# Patient Record
Sex: Female | Born: 1985
Health system: Southern US, Community
[De-identification: ages and names within clinical notes are randomized; demographics above are authoritative.]

## PROBLEM LIST (undated history)

## (undated) NOTE — *Deleted (*Deleted)
Venous duplex completed and

---

## 2020-05-15 ENCOUNTER — Inpatient Hospital Stay (HOSPITAL_COMMUNITY): Payer: Medicaid Other

## 2020-05-15 ENCOUNTER — Emergency Department (HOSPITAL_COMMUNITY): Payer: Medicaid Other

## 2020-05-15 ENCOUNTER — Inpatient Hospital Stay (HOSPITAL_COMMUNITY)
Admission: EM | Admit: 2020-05-15 | Discharge: 2020-06-11 | DRG: 003 | Disposition: A | Discharge status: Rehabilitation Facility | Payer: Medicaid Other | Attending: Physician Assistant | Admitting: Physician Assistant

## 2020-05-15 ENCOUNTER — Other Ambulatory Visit: Payer: Self-pay

## 2020-05-15 DIAGNOSIS — D271 Benign neoplasm of left ovary: Secondary | ICD-10-CM | POA: Diagnosis present

## 2020-05-15 DIAGNOSIS — S62394B Other fracture of fourth metacarpal bone, right hand, initial encounter for open fracture: Secondary | ICD-10-CM | POA: Diagnosis present

## 2020-05-15 DIAGNOSIS — S52272A Monteggia's fracture of left ulna, initial encounter for closed fracture: Secondary | ICD-10-CM | POA: Diagnosis present

## 2020-05-15 DIAGNOSIS — Z930 Tracheostomy status: Secondary | ICD-10-CM

## 2020-05-15 DIAGNOSIS — R319 Hematuria, unspecified: Secondary | ICD-10-CM | POA: Diagnosis present

## 2020-05-15 DIAGNOSIS — K317 Polyp of stomach and duodenum: Secondary | ICD-10-CM | POA: Diagnosis present

## 2020-05-15 DIAGNOSIS — R402112 Coma scale, eyes open, never, at arrival to emergency department: Secondary | ICD-10-CM | POA: Diagnosis present

## 2020-05-15 DIAGNOSIS — S270XXA Traumatic pneumothorax, initial encounter: Secondary | ICD-10-CM | POA: Diagnosis present

## 2020-05-15 DIAGNOSIS — I959 Hypotension, unspecified: Secondary | ICD-10-CM | POA: Diagnosis present

## 2020-05-15 DIAGNOSIS — Y92410 Unspecified street and highway as the place of occurrence of the external cause: Secondary | ICD-10-CM | POA: Diagnosis not present

## 2020-05-15 DIAGNOSIS — J189 Pneumonia, unspecified organism: Secondary | ICD-10-CM | POA: Diagnosis present

## 2020-05-15 DIAGNOSIS — S2220XA Unspecified fracture of sternum, initial encounter for closed fracture: Secondary | ICD-10-CM

## 2020-05-15 DIAGNOSIS — J939 Pneumothorax, unspecified: Secondary | ICD-10-CM

## 2020-05-15 DIAGNOSIS — R509 Fever, unspecified: Secondary | ICD-10-CM | POA: Diagnosis not present

## 2020-05-15 DIAGNOSIS — Y906 Blood alcohol level of 120-199 mg/100 ml: Secondary | ICD-10-CM | POA: Diagnosis present

## 2020-05-15 DIAGNOSIS — S2222XA Fracture of body of sternum, initial encounter for closed fracture: Secondary | ICD-10-CM | POA: Diagnosis present

## 2020-05-15 DIAGNOSIS — B002 Herpesviral gingivostomatitis and pharyngotonsillitis: Secondary | ICD-10-CM | POA: Diagnosis not present

## 2020-05-15 DIAGNOSIS — Z9911 Dependence on respirator [ventilator] status: Secondary | ICD-10-CM | POA: Diagnosis not present

## 2020-05-15 DIAGNOSIS — J9601 Acute respiratory failure with hypoxia: Secondary | ICD-10-CM | POA: Diagnosis present

## 2020-05-15 DIAGNOSIS — J9690 Respiratory failure, unspecified, unspecified whether with hypoxia or hypercapnia: Secondary | ICD-10-CM | POA: Diagnosis not present

## 2020-05-15 DIAGNOSIS — G479 Sleep disorder, unspecified: Secondary | ICD-10-CM | POA: Diagnosis not present

## 2020-05-15 DIAGNOSIS — I82442 Acute embolism and thrombosis of left tibial vein: Secondary | ICD-10-CM | POA: Diagnosis not present

## 2020-05-15 DIAGNOSIS — S52209A Unspecified fracture of shaft of unspecified ulna, initial encounter for closed fracture: Secondary | ICD-10-CM

## 2020-05-15 DIAGNOSIS — S62616B Displaced fracture of proximal phalanx of right little finger, initial encounter for open fracture: Secondary | ICD-10-CM | POA: Diagnosis present

## 2020-05-15 DIAGNOSIS — S069X0D Unspecified intracranial injury without loss of consciousness, subsequent encounter: Secondary | ICD-10-CM | POA: Diagnosis not present

## 2020-05-15 DIAGNOSIS — S069X0S Unspecified intracranial injury without loss of consciousness, sequela: Secondary | ICD-10-CM | POA: Diagnosis not present

## 2020-05-15 DIAGNOSIS — S06359D Traumatic hemorrhage of left cerebrum with loss of consciousness of unspecified duration, subsequent encounter: Secondary | ICD-10-CM | POA: Diagnosis not present

## 2020-05-15 DIAGNOSIS — K121 Other forms of stomatitis: Secondary | ICD-10-CM | POA: Diagnosis not present

## 2020-05-15 DIAGNOSIS — S0181XA Laceration without foreign body of other part of head, initial encounter: Secondary | ICD-10-CM

## 2020-05-15 DIAGNOSIS — S062X9A Diffuse traumatic brain injury with loss of consciousness of unspecified duration, initial encounter: Principal | ICD-10-CM | POA: Diagnosis present

## 2020-05-15 DIAGNOSIS — I82452 Acute embolism and thrombosis of left peroneal vein: Secondary | ICD-10-CM | POA: Diagnosis not present

## 2020-05-15 DIAGNOSIS — T07XXXA Unspecified multiple injuries, initial encounter: Secondary | ICD-10-CM | POA: Diagnosis not present

## 2020-05-15 DIAGNOSIS — Z7289 Other problems related to lifestyle: Secondary | ICD-10-CM

## 2020-05-15 DIAGNOSIS — T148XXA Other injury of unspecified body region, initial encounter: Secondary | ICD-10-CM

## 2020-05-15 DIAGNOSIS — S52122A Displaced fracture of head of left radius, initial encounter for closed fracture: Secondary | ICD-10-CM | POA: Diagnosis present

## 2020-05-15 DIAGNOSIS — K668 Other specified disorders of peritoneum: Secondary | ICD-10-CM

## 2020-05-15 DIAGNOSIS — R1310 Dysphagia, unspecified: Secondary | ICD-10-CM | POA: Diagnosis present

## 2020-05-15 DIAGNOSIS — R402222 Coma scale, best verbal response, incomprehensible words, at arrival to emergency department: Secondary | ICD-10-CM | POA: Diagnosis present

## 2020-05-15 DIAGNOSIS — R402342 Coma scale, best motor response, flexion withdrawal, at arrival to emergency department: Secondary | ICD-10-CM | POA: Diagnosis present

## 2020-05-15 DIAGNOSIS — T1490XA Injury, unspecified, initial encounter: Secondary | ICD-10-CM

## 2020-05-15 DIAGNOSIS — S0240EB Zygomatic fracture, right side, initial encounter for open fracture: Secondary | ICD-10-CM | POA: Diagnosis present

## 2020-05-15 DIAGNOSIS — S069X9S Unspecified intracranial injury with loss of consciousness of unspecified duration, sequela: Secondary | ICD-10-CM | POA: Diagnosis not present

## 2020-05-15 DIAGNOSIS — S62619A Displaced fracture of proximal phalanx of unspecified finger, initial encounter for closed fracture: Secondary | ICD-10-CM

## 2020-05-15 DIAGNOSIS — D62 Acute posthemorrhagic anemia: Secondary | ICD-10-CM | POA: Diagnosis not present

## 2020-05-15 DIAGNOSIS — Z419 Encounter for procedure for purposes other than remedying health state, unspecified: Secondary | ICD-10-CM

## 2020-05-15 DIAGNOSIS — K9423 Gastrostomy malfunction: Secondary | ICD-10-CM | POA: Diagnosis not present

## 2020-05-15 DIAGNOSIS — Z20822 Contact with and (suspected) exposure to covid-19: Secondary | ICD-10-CM | POA: Diagnosis present

## 2020-05-15 DIAGNOSIS — Z931 Gastrostomy status: Secondary | ICD-10-CM | POA: Diagnosis not present

## 2020-05-15 DIAGNOSIS — S069X9A Unspecified intracranial injury with loss of consciousness of unspecified duration, initial encounter: Secondary | ICD-10-CM

## 2020-05-15 DIAGNOSIS — S52122S Displaced fracture of head of left radius, sequela: Secondary | ICD-10-CM | POA: Diagnosis not present

## 2020-05-15 DIAGNOSIS — R000 Tachycardia, unspecified: Secondary | ICD-10-CM | POA: Diagnosis not present

## 2020-05-15 DIAGNOSIS — Z23 Encounter for immunization: Secondary | ICD-10-CM | POA: Diagnosis not present

## 2020-05-15 DIAGNOSIS — S36113A Laceration of liver, unspecified degree, initial encounter: Secondary | ICD-10-CM

## 2020-05-15 DIAGNOSIS — D72829 Elevated white blood cell count, unspecified: Secondary | ICD-10-CM

## 2020-05-15 DIAGNOSIS — F0281 Dementia in other diseases classified elsewhere with behavioral disturbance: Secondary | ICD-10-CM | POA: Diagnosis not present

## 2020-05-15 DIAGNOSIS — S069XAA Unspecified intracranial injury with loss of consciousness status unknown, initial encounter: Secondary | ICD-10-CM

## 2020-05-15 DIAGNOSIS — S5292XA Unspecified fracture of left forearm, initial encounter for closed fracture: Secondary | ICD-10-CM

## 2020-05-15 DIAGNOSIS — S36116A Major laceration of liver, initial encounter: Secondary | ICD-10-CM | POA: Diagnosis present

## 2020-05-15 DIAGNOSIS — R451 Restlessness and agitation: Secondary | ICD-10-CM | POA: Diagnosis not present

## 2020-05-15 DIAGNOSIS — R1312 Dysphagia, oropharyngeal phase: Secondary | ICD-10-CM | POA: Diagnosis not present

## 2020-05-15 DIAGNOSIS — S069X4D Unspecified intracranial injury with loss of consciousness of 6 hours to 24 hours, subsequent encounter: Secondary | ICD-10-CM | POA: Diagnosis not present

## 2020-05-15 DIAGNOSIS — R2242 Localized swelling, mass and lump, left lower limb: Secondary | ICD-10-CM

## 2020-05-15 DIAGNOSIS — R7309 Other abnormal glucose: Secondary | ICD-10-CM | POA: Diagnosis not present

## 2020-05-15 DIAGNOSIS — R739 Hyperglycemia, unspecified: Secondary | ICD-10-CM | POA: Diagnosis not present

## 2020-05-15 DIAGNOSIS — S61226A Laceration with foreign body of right little finger without damage to nail, initial encounter: Secondary | ICD-10-CM

## 2020-05-15 DIAGNOSIS — M25429 Effusion, unspecified elbow: Secondary | ICD-10-CM

## 2020-05-15 DIAGNOSIS — F981 Encopresis not due to a substance or known physiological condition: Secondary | ICD-10-CM | POA: Diagnosis not present

## 2020-05-15 DIAGNOSIS — S62646A Nondisplaced fracture of proximal phalanx of right little finger, initial encounter for closed fracture: Secondary | ICD-10-CM

## 2020-05-15 DIAGNOSIS — S069X4S Unspecified intracranial injury with loss of consciousness of 6 hours to 24 hours, sequela: Secondary | ICD-10-CM | POA: Diagnosis not present

## 2020-05-15 LAB — I-STAT ARTERIAL BLOOD GAS, ED
Acid-base deficit: 7 mmol/L — ABNORMAL HIGH (ref 0.0–2.0)
Bicarbonate: 19.4 mmol/L — ABNORMAL LOW (ref 20.0–28.0)
Calcium, Ion: 1.14 mmol/L — ABNORMAL LOW (ref 1.15–1.40)
HCT: 40 % (ref 36.0–46.0)
Hemoglobin: 13.6 g/dL (ref 12.0–15.0)
O2 Saturation: 100 %
Patient temperature: 97.5
Potassium: 4.1 mmol/L (ref 3.5–5.1)
Sodium: 144 mmol/L (ref 135–145)
TCO2: 21 mmol/L — ABNORMAL LOW (ref 22–32)
pCO2 arterial: 38.7 mmHg (ref 32.0–48.0)
pH, Arterial: 7.305 — ABNORMAL LOW (ref 7.350–7.450)
pO2, Arterial: 538 mmHg — ABNORMAL HIGH (ref 83.0–108.0)

## 2020-05-15 LAB — CBC
HCT: 34.6 % — ABNORMAL LOW (ref 36.0–46.0)
HCT: 39.8 % (ref 36.0–46.0)
HCT: 41 % (ref 36.0–46.0)
HCT: 36 % (ref 36.0–46.0)
Hemoglobin: 11.2 g/dL — ABNORMAL LOW (ref 12.0–15.0)
Hemoglobin: 12.9 g/dL (ref 12.0–15.0)
Hemoglobin: 13.2 g/dL (ref 12.0–15.0)
Hemoglobin: 11.8 g/dL — ABNORMAL LOW (ref 12.0–15.0)
MCH: 30.6 pg (ref 26.0–34.0)
MCH: 30 pg (ref 26.0–34.0)
MCH: 30.3 pg (ref 26.0–34.0)
MCH: 30.9 pg (ref 26.0–34.0)
MCHC: 32.4 g/dL (ref 30.0–36.0)
MCHC: 32.2 g/dL (ref 30.0–36.0)
MCHC: 32.4 g/dL (ref 30.0–36.0)
MCHC: 32.8 g/dL (ref 30.0–36.0)
MCV: 94.5 fL (ref 80.0–100.0)
MCV: 93.2 fL (ref 80.0–100.0)
MCV: 93.4 fL (ref 80.0–100.0)
MCV: 94.2 fL (ref 80.0–100.0)
Platelets: 283 10*3/uL (ref 150–400)
Platelets: 202 10*3/uL (ref 150–400)
Platelets: UNDETERMINED 10*3/uL (ref 150–400)
Platelets: 200 10*3/uL (ref 150–400)
RBC: 3.66 MIL/uL — ABNORMAL LOW (ref 3.87–5.11)
RBC: 4.26 MIL/uL (ref 3.87–5.11)
RBC: 4.4 MIL/uL (ref 3.87–5.11)
RBC: 3.82 MIL/uL — ABNORMAL LOW (ref 3.87–5.11)
RDW: 12.2 % (ref 11.5–15.5)
RDW: 12.5 % (ref 11.5–15.5)
RDW: 12.7 % (ref 11.5–15.5)
RDW: 12.7 % (ref 11.5–15.5)
WBC: 11.7 10*3/uL — ABNORMAL HIGH (ref 4.0–10.5)
WBC: 15.9 10*3/uL — ABNORMAL HIGH (ref 4.0–10.5)
WBC: 16.2 10*3/uL — ABNORMAL HIGH (ref 4.0–10.5)
WBC: 13.9 10*3/uL — ABNORMAL HIGH (ref 4.0–10.5)
nRBC: 0 % (ref 0.0–0.2)
nRBC: 0 % (ref 0.0–0.2)
nRBC: 0 % (ref 0.0–0.2)
nRBC: 0 % (ref 0.0–0.2)

## 2020-05-15 LAB — COMPREHENSIVE METABOLIC PANEL
ALT: 688 U/L — ABNORMAL HIGH (ref 0–44)
ALT: 1668 U/L — ABNORMAL HIGH (ref 0–44)
AST: 729 U/L — ABNORMAL HIGH (ref 15–41)
AST: 1838 U/L — ABNORMAL HIGH (ref 15–41)
Albumin: 3.2 g/dL — ABNORMAL LOW (ref 3.5–5.0)
Albumin: 3.5 g/dL (ref 3.5–5.0)
Alkaline Phosphatase: 50 U/L (ref 38–126)
Alkaline Phosphatase: 49 U/L (ref 38–126)
Anion gap: 12 (ref 5–15)
Anion gap: 11 (ref 5–15)
BUN: 14 mg/dL (ref 6–20)
BUN: 12 mg/dL (ref 6–20)
CO2: 17 mmol/L — ABNORMAL LOW (ref 22–32)
CO2: 18 mmol/L — ABNORMAL LOW (ref 22–32)
Calcium: 8.3 mg/dL — ABNORMAL LOW (ref 8.9–10.3)
Calcium: 8.2 mg/dL — ABNORMAL LOW (ref 8.9–10.3)
Chloride: 112 mmol/L — ABNORMAL HIGH (ref 98–111)
Chloride: 112 mmol/L — ABNORMAL HIGH (ref 98–111)
Creatinine, Ser: 1.02 mg/dL — ABNORMAL HIGH (ref 0.44–1.00)
Creatinine, Ser: 0.89 mg/dL (ref 0.44–1.00)
GFR, Estimated: 37 mL/min — ABNORMAL LOW (ref >60)
GFR, Estimated: >60 mL/min (ref >60)
Glucose, Bld: 144 mg/dL — ABNORMAL HIGH (ref 70–99)
Glucose, Bld: 127 mg/dL — ABNORMAL HIGH (ref 70–99)
Potassium: 3.8 mmol/L (ref 3.5–5.1)
Potassium: 4.6 mmol/L (ref 3.5–5.1)
Sodium: 141 mmol/L (ref 135–145)
Sodium: 141 mmol/L (ref 135–145)
Total Bilirubin: 0.4 mg/dL (ref 0.3–1.2)
Total Bilirubin: 0.7 mg/dL (ref 0.3–1.2)
Total Protein: 5.5 g/dL — ABNORMAL LOW (ref 6.5–8.1)
Total Protein: 5.9 g/dL — ABNORMAL LOW (ref 6.5–8.1)

## 2020-05-15 LAB — I-STAT CHEM 8, ED
BUN: 16 mg/dL (ref 6–20)
Calcium, Ion: 1.14 mmol/L — ABNORMAL LOW (ref 1.15–1.40)
Chloride: 109 mmol/L (ref 98–111)
Creatinine, Ser: 1.2 mg/dL — ABNORMAL HIGH (ref 0.44–1.00)
Glucose, Bld: 135 mg/dL — ABNORMAL HIGH (ref 70–99)
HCT: 33 % — ABNORMAL LOW (ref 36.0–46.0)
Hemoglobin: 11.2 g/dL — ABNORMAL LOW (ref 12.0–15.0)
Potassium: 4.1 mmol/L (ref 3.5–5.1)
Sodium: 144 mmol/L (ref 135–145)
TCO2: 19 mmol/L — ABNORMAL LOW (ref 22–32)

## 2020-05-15 LAB — PROTIME-INR
INR: 1.2 (ref 0.8–1.2)
INR: 1.2 (ref 0.8–1.2)
INR: 1.1 (ref 0.8–1.2)
Prothrombin Time: 14.7 seconds (ref 11.4–15.2)
Prothrombin Time: 14.7 seconds (ref 11.4–15.2)
Prothrombin Time: 14.2 seconds (ref 11.4–15.2)

## 2020-05-15 LAB — BLOOD PRODUCT ORDER (VERBAL) VERIFICATION

## 2020-05-15 LAB — URINALYSIS, ROUTINE W REFLEX MICROSCOPIC
Bilirubin Urine: NEGATIVE
Glucose, UA: 50 mg/dL — AB
Ketones, ur: NEGATIVE mg/dL
Leukocytes,Ua: NEGATIVE
Nitrite: NEGATIVE
Protein, ur: 100 mg/dL — AB
Specific Gravity, Urine: 1.016 (ref 1.005–1.030)
pH: 6 (ref 5.0–8.0)

## 2020-05-15 LAB — ABO/RH: ABO/RH(D): O POS

## 2020-05-15 LAB — I-STAT BETA HCG BLOOD, ED (MC, WL, AP ONLY): I-stat hCG, quantitative: <5 m[IU]/mL (ref <5)

## 2020-05-15 LAB — URINALYSIS, MICROSCOPIC (REFLEX): RBC / HPF: >50 RBC/hpf (ref 0–5)

## 2020-05-15 LAB — RESPIRATORY PANEL BY RT PCR (FLU A&B, COVID)
Influenza A by PCR: NEGATIVE
Influenza B by PCR: NEGATIVE
SARS Coronavirus 2 by RT PCR: NEGATIVE

## 2020-05-15 LAB — MRSA PCR SCREENING: MRSA by PCR: NEGATIVE

## 2020-05-15 LAB — TRIGLYCERIDES: Triglycerides: 65 mg/dL (ref <150)

## 2020-05-15 LAB — ETHANOL: Alcohol, Ethyl (B): 189 mg/dL — ABNORMAL HIGH (ref <10)

## 2020-05-15 LAB — LACTIC ACID, PLASMA: Lactic Acid, Venous: 3.4 mmol/L (ref 0.5–1.9)

## 2020-05-15 LAB — APTT: aPTT: 20 seconds — ABNORMAL LOW (ref 24–36)

## 2020-05-15 MED ORDER — ADULT MULTIVITAMIN W/MINERALS CH
1.0000 | ORAL_TABLET | Freq: Every day | ORAL | Status: DC
  Administered 2020-05-15: 1 via ORAL
  Filled 2020-05-15: qty 1

## 2020-05-15 MED ORDER — ONDANSETRON HCL 4 MG/2ML IJ SOLN
4.0000 mg | Freq: Four times a day (QID) | INTRAMUSCULAR | Status: DC | PRN
  Administered 2020-05-15: 4 mg via INTRAVENOUS
  Filled 2020-05-15: qty 2

## 2020-05-15 MED ORDER — ACETAMINOPHEN 160 MG/5ML PO SOLN
650.0000 mg | Freq: Four times a day (QID) | ORAL | Status: DC | PRN
  Administered 2020-05-15 – 2020-05-17 (×3): 650 mg
  Filled 2020-05-15 (×3): qty 20.3

## 2020-05-15 MED ORDER — LIDOCAINE-EPINEPHRINE 1 %-1:100000 IJ SOLN
20.0000 mL | Freq: Once | INTRAMUSCULAR | Status: AC
  Administered 2020-05-15: 20 mL via INTRADERMAL
  Filled 2020-05-15: qty 1

## 2020-05-15 MED ORDER — LORAZEPAM 2 MG/ML IJ SOLN: 1.0000 mg | INTRAMUSCULAR | Status: AC | PRN

## 2020-05-15 MED ORDER — CHLORHEXIDINE GLUCONATE 0.12% ORAL RINSE (MEDLINE KIT)
15.0000 mL | Freq: Two times a day (BID) | OROMUCOSAL | Status: DC
  Administered 2020-05-15 – 2020-05-23 (×17): 15 mL via OROMUCOSAL

## 2020-05-15 MED ORDER — ORAL CARE MOUTH RINSE
15.0000 mL | OROMUCOSAL | Status: DC
  Administered 2020-05-15 – 2020-05-24 (×84): 15 mL via OROMUCOSAL

## 2020-05-15 MED ORDER — ONDANSETRON 4 MG PO TBDP: 4.0000 mg | ORAL_TABLET | Freq: Four times a day (QID) | ORAL | Status: DC | PRN

## 2020-05-15 MED ORDER — FENTANYL BOLUS VIA INFUSION
50.0000 ug | INTRAVENOUS | Status: DC | PRN
  Administered 2020-05-15 – 2020-05-23 (×10): 50 ug via INTRAVENOUS
  Filled 2020-05-15: qty 50

## 2020-05-15 MED ORDER — POLYETHYLENE GLYCOL 3350 17 G PO PACK
17.0000 g | PACK | Freq: Every day | ORAL | Status: DC
  Administered 2020-05-15: 17 g via ORAL
  Filled 2020-05-15: qty 1

## 2020-05-15 MED ORDER — ETOMIDATE 2 MG/ML IV SOLN
INTRAVENOUS | Status: AC | PRN
  Administered 2020-05-15: 30 mg via INTRAVENOUS

## 2020-05-15 MED ORDER — FOLIC ACID 1 MG PO TABS
1.0000 mg | ORAL_TABLET | Freq: Every day | ORAL | Status: DC
  Administered 2020-05-15: 1 mg via ORAL
  Filled 2020-05-15 (×2): qty 1

## 2020-05-15 MED ORDER — THIAMINE HCL 100 MG PO TABS
100.0000 mg | ORAL_TABLET | Freq: Every day | ORAL | Status: DC
  Administered 2020-05-15: 100 mg via ORAL
  Filled 2020-05-15 (×2): qty 1

## 2020-05-15 MED ORDER — CHLORHEXIDINE GLUCONATE CLOTH 2 % EX PADS
6.0000 | MEDICATED_PAD | Freq: Every day | CUTANEOUS | Status: DC
  Administered 2020-05-16 – 2020-06-11 (×26): 6 via TOPICAL

## 2020-05-15 MED ORDER — LORAZEPAM 1 MG PO TABS: 1.0000 mg | ORAL_TABLET | ORAL | Status: AC | PRN

## 2020-05-15 MED ORDER — IOHEXOL 300 MG/ML  SOLN
100.0000 mL | Freq: Once | INTRAMUSCULAR | Status: AC | PRN
  Administered 2020-05-15: 100 mL via INTRAVENOUS

## 2020-05-15 MED ORDER — PROPOFOL 1000 MG/100ML IV EMUL
0.0000 ug/kg/min | INTRAVENOUS | Status: DC
  Administered 2020-05-15 – 2020-05-16 (×2): 5 ug/kg/min via INTRAVENOUS
  Administered 2020-05-17: 10 ug/kg/min via INTRAVENOUS
  Filled 2020-05-15 (×3): qty 100

## 2020-05-15 MED ORDER — FENTANYL CITRATE (PF) 100 MCG/2ML IJ SOLN
100.0000 ug | INTRAMUSCULAR | Status: DC | PRN
  Filled 2020-05-15: qty 2

## 2020-05-15 MED ORDER — THIAMINE HCL 100 MG/ML IJ SOLN
100.0000 mg | Freq: Every day | INTRAMUSCULAR | Status: DC
  Filled 2020-05-15: qty 2

## 2020-05-15 MED ORDER — TETANUS-DIPHTH-ACELL PERTUSSIS 5-2.5-18.5 LF-MCG/0.5 IM SUSP
0.5000 mL | Freq: Once | INTRAMUSCULAR | Status: AC
  Administered 2020-05-15: 0.5 mL via INTRAMUSCULAR
  Filled 2020-05-15: qty 0.5

## 2020-05-15 MED ORDER — FENTANYL CITRATE (PF) 100 MCG/2ML IJ SOLN: 100.0000 ug | INTRAMUSCULAR | Status: DC | PRN

## 2020-05-15 MED ORDER — DOCUSATE SODIUM 50 MG/5ML PO LIQD
100.0000 mg | Freq: Two times a day (BID) | ORAL | Status: DC
  Administered 2020-05-15: 100 mg via ORAL
  Filled 2020-05-15 (×4): qty 10

## 2020-05-15 MED ORDER — LORAZEPAM 2 MG/ML IJ SOLN
0.0000 mg | Freq: Four times a day (QID) | INTRAMUSCULAR | Status: AC
  Filled 2020-05-15: qty 1

## 2020-05-15 MED ORDER — FENTANYL CITRATE (PF) 100 MCG/2ML IJ SOLN: 50.0000 ug | Freq: Once | INTRAMUSCULAR | Status: DC

## 2020-05-15 MED ORDER — ROCURONIUM BROMIDE 50 MG/5ML IV SOLN
INTRAVENOUS | Status: AC | PRN
  Administered 2020-05-15: 100 mg via INTRAVENOUS

## 2020-05-15 MED ORDER — LORAZEPAM 2 MG/ML IJ SOLN: 0.0000 mg | Freq: Two times a day (BID) | INTRAMUSCULAR | Status: AC

## 2020-05-15 MED ORDER — POTASSIUM CHLORIDE IN NACL 20-0.9 MEQ/L-% IV SOLN
INTRAVENOUS | Status: DC
  Filled 2020-05-15 (×8): qty 1000

## 2020-05-15 MED ORDER — FENTANYL 2500MCG IN NS 250ML (10MCG/ML) PREMIX INFUSION
50.0000 ug/h | INTRAVENOUS | Status: DC
  Administered 2020-05-15 – 2020-05-22 (×5): 50 ug/h via INTRAVENOUS
  Filled 2020-05-15 (×5): qty 250

## 2020-05-15 MED ORDER — CEFAZOLIN SODIUM-DEXTROSE 1-4 GM/50ML-% IV SOLN
1.0000 g | Freq: Once | INTRAVENOUS | Status: AC
  Administered 2020-05-15: 1 g via INTRAVENOUS
  Filled 2020-05-15: qty 50

## 2020-05-15 MED ORDER — SODIUM CHLORIDE 0.9 % IV SOLN
INTRAVENOUS | Status: AC | PRN
  Administered 2020-05-15: 2000 mL via INTRAVENOUS
  Administered 2020-05-15: 1000 mL via INTRAVENOUS

## 2020-05-15 NOTE — Consult Note (Signed)
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 Vascular: No hyperdense vessel or unexpected calcification. Skull: Normal. Negative for fracture or focal lesion. Other: Large right temporal scalp hematoma. There is/tray shin of the skin lateral to the right orbit. Small left parietal scalp hematoma. CT MAXILLOFACIAL FINDINGS Osseous: There is a mildly displaced fracture of the right zygomatic arch.  No other acute fracture identified. No mandibular subluxation. Orbits: The globes and retro-orbital fat are preserved. Sinuses: There is opacification of the left sphenoid sinus. The remainder of the visualized paranasal sinuses and mastoid air cells are clear. Soft tissues: Large right temporal soft tissue hematoma. Laceration of the skin lateral to the right orbit. CT CERVICAL SPINE FINDINGS Alignment: No acute subluxation. There is straightening of normal cervical lordosis which may be positional or due to muscle spasm. Skull base and vertebrae: No acute fracture. Soft tissues and spinal canal: No prevertebral fluid or swelling. No visible canal hematoma. Disc levels: No acute findings. No significant  degenerative changes. Upper chest: Negative. Other: Endotracheal tube with tip above the carina. Enteric tube partially visualized. IMPRESSION: 1. Small punctate hemorrhages involving the posterior left corpus callosum most consistent with shear injury. There is probable trace amount of blood in the left lateral ventricle. No large bleed, mass effect, or midline shift. 2. Mildly displaced fracture of the right zygomatic arch. 3. No acute/traumatic cervical spine pathology. These results were called by telephone at the time of interpretation on 05/15/2020 at 2:47 am to Dr. Bonnita Levan who verbally acknowledged these results. Electronically Signed   By: Anner Crete M.D.   On: 05/15/2020 02:48    Assessment/Plan: Patient presents with a complex facial laceration minimally displaced zygomatic arch fracture.  Zygomatic arch fracture is nonoperative.  I closed the laceration today with absorbable sutures.  Would recommend applying ophthalmic bacitracin to the incision line.  Will need to examine once awake for a more thorough exam.  Preoperative diagnosis: Complex right facial laceration Postop diagnosis: Same Procedure performed: Complex closure of right facial laceration 5 cm in length Procedure in detail: The areas anesthetized with 1% lidocaine with epinephrine.  It was irrigated copiously with saline.  It was prepped and draped sterilely with Betadine.  4-0 Vicryl was used to reapproximate the muscular layer over the bone into the line of the laceration.  The edges were then debrided sharply with scissors.  The remainder of the skin was then closed with interrupted buried 5-0 Monocryl sutures and 5-0 fast gut sutures.  This gave her a nice on table result.  Should note that the lateral canthus and the eyelid anatomy was intact.  Cindra Presume 05/15/2020, 6:24 AM

## 2020-05-15 NOTE — Progress Notes (Signed)
Re-evaluated with Dr. Grandville Silos to see if she could extubated.  She withdraws to noxious stimuli, especially when trying to evaluate her right eye.  Unfortunately, still not following commands well enough to extubated.  Dr. Fredna Dow evaluated patient as well and states that hand should be able to be treated nonoperatively in a splint.  She is noted to have edema on the dorsal aspect of her left foot as well as edema of her left elbow.  Will obtain films of both of these and consult as needed.  Beth [name] 1:37 PM 05/15/2020

## 2020-05-15 NOTE — Progress Notes (Signed)
Orthopedic Tech Progress Note Patient Details:  Beth [name] 07/05/1989 [id number]  Ortho Devices Type of Ortho Device: Ulna gutter splint Ortho Device/Splint Location: rue Ortho Device/Splint Interventions: Ordered, Application, Adjustment   Post Interventions Patient Tolerated: Well Instructions Provided: Care of device, Adjustment of device   Beth [name] 05/15/2020, 9:33 AM

## 2020-05-15 NOTE — Progress Notes (Signed)
Orthopedic Tech Progress Note Patient Details:  Beth [name] 07/05/1989 [id number]  Ortho Devices Type of Ortho Device: Post (long arm) splint Ortho Device/Splint Location: lue. sade assisted Ortho Device/Splint Interventions: Ordered, Application, Adjustment   Post Interventions Patient Tolerated: Well Instructions Provided: Care of device, Adjustment of device   Karolee Stamps 05/15/2020, 9:19 PM

## 2020-05-15 NOTE — ED Notes (Signed)
Ortho tech in to see patient for right hand splint. Patient was restless and pulling away during splint patient. Pt medicated per protocol. Patient had one episode of vomiting during splint placement. Patient suctioned at mouth. Patient now resting comfortably.

## 2020-05-15 NOTE — Progress Notes (Signed)
Patient ID: Beth [name], female   DOB: 07/25/1876, 34 y.o.   MRN: [id number]   Radiology has reviewed films further.  Other injuries include:   small punctate hemorrhages in the left corpus callosum  Fractures of the fourth metacarpal head and proximal phalanx of the 5th digit  Small sternal fracture  Incidental 4 cm left ovarian teratoma
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
## 2020-05-15 NOTE — ED Notes (Signed)
Assumed care of patient at this time. Dr. Grandville Silos, trauma MD at bedside. MD aware of blood pressure and VS. Awaiting admission.
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
## 2020-05-15 NOTE — Consult Note (Signed)
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 4 mm radiopaque focus in the soft tissues of lateral left chest wall or left breast. Clinical correlation is recommended to evaluate for retained foreign fragment. Electronically Signed   By: Anner Crete M.D.   On: 05/15/2020 02:28   DG Hand Complete Right  Result Date: 05/15/2020 CLINICAL DATA:  Level 1 trauma. EXAM: RIGHT HAND - COMPLETE 3+ VIEW COMPARISON:  None. FINDINGS: There is a nondisplaced fracture of the fourth metacarpal head. There is a nondisplaced fracture of the base of the proximal phalanx of the fifth digit. Faint lucency through the base of the ulnar styloid may be artifactual or represent nondisplaced fracture. There is no dislocation. Soft tissue swelling of the ulnar aspect of the hand. IMPRESSION: 1. Nondisplaced fractures of the fourth metacarpal head and proximal phalanx of the fifth digit. 2. Artifact versus nondisplaced fracture of the ulnar styloid. Electronically Signed   By: Anner Crete M.D.   On: 05/15/2020 03:10    CT MAXILLOFACIAL WO CONTRAST  Result Date: 05/15/2020 CLINICAL DATA:  Level 1 trauma. EXAM: CT HEAD WITHOUT CONTRAST CT MAXILLOFACIAL WITHOUT CONTRAST CT CERVICAL SPINE WITHOUT CONTRAST TECHNIQUE: Multidetector CT imaging of the head, cervical spine, and maxillofacial structures were performed using the standard protocol without intravenous contrast. Multiplanar CT image reconstructions of the cervical spine and maxillofacial structures were also generated. COMPARISON:  None FINDINGS: CT HEAD FINDINGS Brain: The ventricles and sulci appropriate size for patient's age. The gray-white matter discrimination is preserved. Tiny high density foci involving the isthmus of corpus callosum on the left (coronal 47/5 and sagittal 35/6) most consistent with shear injury. There is probable minimal amount of blood in the posterior aspect of the left lateral ventricle (20/3). No other acute intracranial hemorrhage identified. There is no mass effect or midline shift. No extra-axial fluid collection. Vascular: No hyperdense vessel or unexpected calcification. Skull: Normal. Negative for fracture or focal lesion. Other: Large right temporal scalp hematoma. There is/tray shin of the skin lateral to the right orbit. Small left parietal scalp hematoma. CT MAXILLOFACIAL FINDINGS Osseous: There is a mildly displaced fracture of the right zygomatic arch. No other acute fracture identified. No mandibular subluxation. Orbits: The globes and retro-orbital fat are preserved. Sinuses: There is opacification of the left sphenoid sinus. The remainder of the visualized paranasal sinuses and mastoid air cells are clear. Soft tissues: Large right temporal soft tissue hematoma. Laceration of the skin lateral to  the right orbit. CT CERVICAL SPINE FINDINGS Alignment: No acute subluxation. There is straightening of normal cervical lordosis which may be positional or due to muscle spasm. Skull base and vertebrae: No acute fracture. Soft tissues  and spinal canal: No prevertebral fluid or swelling. No visible canal hematoma. Disc levels: No acute findings. No significant degenerative changes. Upper chest: Negative. Other: Endotracheal tube with tip above the carina. Enteric tube partially visualized. IMPRESSION: 1. Small punctate hemorrhages involving the posterior left corpus callosum most consistent with shear injury. There is probable trace amount of blood in the left lateral ventricle. No large bleed, mass effect, or midline shift. 2. Mildly displaced fracture of the right zygomatic arch. 3. No acute/traumatic cervical spine pathology. These results were called by telephone at the time of interpretation on 05/15/2020 at 2:47 am to Dr. Bonnita Levan who verbally acknowledged these results. Electronically Signed   By: Anner Crete M.D.   On: 05/15/2020 02:48     Review of systems not obtained due to patient factors.   Blood pressure 115/75, pulse (!) 120, temperature (!) 101 F (38.3 C), resp. rate 16, height 5\' 6"  (1.676 m), weight 72.6 kg, SpO2 100 %.  General appearance: intubated Head: facial laceration Neck: in collar Extremities: brisk capillary refill all digits.  Moving digits.  Reactive to stimulation.  Right ulnar gutter splint in place.  Deformity left elbow Skin: Skin color, texture, turgor normal. No rashes or lesions Neurologic: reactive to stimulation Incision/Wound: right small finger longitudinal laceration with tendon visible, noted by clinical photograph  Assessment/Plan Right small finger proximal phalanx fracture and ring finger metacarpal fracture.  Does not appear to be open fracture.  Tendon intact in clinical photograph.  Per Dr. Wilma Flavin exam/note, tendon intact with no exposed bone at time of irrigation and closure of wound.  Minimal displacement of fractures.  Will plan non operative treatment with splinting.  Follow up XR in one week.  Leanora Cover 05/15/2020, 1:24 PM
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
## 2020-05-15 NOTE — Progress Notes (Signed)
Pt was transported from ER 18 to CT scan and up to 5H00 without complication. RT will monitor.

## 2020-05-15 NOTE — Progress Notes (Signed)
Pt transported to CT and back without any complications.  
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
## 2020-05-15 NOTE — Consult Note (Signed)
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 Electronically Signed   By: Anner Crete M.D.   On: 05/15/2020 02:48    Review of Systems  Unable to perform ROS: Intubated   Blood pressure 110/85, pulse (!) 119, temperature 99.3 F (37.4 C), resp. rate 17, height 5\' 6"  (1.676 m), weight 72.6 kg, SpO2 100 %. Physical Exam Constitutional:      General: She is in acute distress.  HENT:     Head:     Comments: Laceration lateral to right eye Neck:     Comments: In hard cervical collar Cardiovascular:  Rate and Rhythm: Normal rate and regular rhythm.  Neurological:     Mental Status: She is unresponsive.     GCS: GCS eye subscore is 1. GCS verbal subscore is 1. GCS motor subscore is 5.     Deep Tendon Reflexes: Babinski sign absent on the right side. Babinski sign absent on the left side.     Comments: Intubated and sedated, on propofol Purposeful? Movements right upper extremity.  No response to noxious stimuli left upper, left lower, right lower extremity Unable to assess sensory system or to perform detailed motor exam     Assessment/Plan: Will need follow up head CT within next 24 hours. No emergent neurological problems. At some point when  sedation can be turned down it should be so exam can be performed.  Should be admitted to ICU. Will follow.  Ashok Pall 05/15/2020, 5:16 AM

## 2020-05-16 ENCOUNTER — Inpatient Hospital Stay (HOSPITAL_COMMUNITY): Payer: Medicaid Other

## 2020-05-16 LAB — CBC
HCT: 35.8 % — ABNORMAL LOW (ref 36.0–46.0)
HCT: 34.7 % — ABNORMAL LOW (ref 36.0–46.0)
HCT: 32.2 % — ABNORMAL LOW (ref 36.0–46.0)
Hemoglobin: 11.9 g/dL — ABNORMAL LOW (ref 12.0–15.0)
Hemoglobin: 11.5 g/dL — ABNORMAL LOW (ref 12.0–15.0)
Hemoglobin: 10.5 g/dL — ABNORMAL LOW (ref 12.0–15.0)
MCH: 31 pg (ref 26.0–34.0)
MCH: 30.7 pg (ref 26.0–34.0)
MCH: 30.6 pg (ref 26.0–34.0)
MCHC: 33.2 g/dL (ref 30.0–36.0)
MCHC: 33.1 g/dL (ref 30.0–36.0)
MCHC: 32.6 g/dL (ref 30.0–36.0)
MCV: 93.2 fL (ref 80.0–100.0)
MCV: 92.8 fL (ref 80.0–100.0)
MCV: 93.9 fL (ref 80.0–100.0)
Platelets: 188 10*3/uL (ref 150–400)
Platelets: 174 10*3/uL (ref 150–400)
Platelets: 171 10*3/uL (ref 150–400)
RBC: 3.84 MIL/uL — ABNORMAL LOW (ref 3.87–5.11)
RBC: 3.74 MIL/uL — ABNORMAL LOW (ref 3.87–5.11)
RBC: 3.43 MIL/uL — ABNORMAL LOW (ref 3.87–5.11)
RDW: 12.9 % (ref 11.5–15.5)
RDW: 13 % (ref 11.5–15.5)
RDW: 12.9 % (ref 11.5–15.5)
WBC: 15.8 10*3/uL — ABNORMAL HIGH (ref 4.0–10.5)
WBC: 15.9 10*3/uL — ABNORMAL HIGH (ref 4.0–10.5)
WBC: 16.1 10*3/uL — ABNORMAL HIGH (ref 4.0–10.5)
nRBC: 0 % (ref 0.0–0.2)
nRBC: 0 % (ref 0.0–0.2)
nRBC: 0 % (ref 0.0–0.2)

## 2020-05-16 LAB — BPAM RBC
Blood Product Expiration Date: 202111182359
ISSUE DATE / TIME: 202110220125
Unit Type and Rh: 9500

## 2020-05-16 LAB — COMPREHENSIVE METABOLIC PANEL WITH GFR
ALT: 1336 U/L — ABNORMAL HIGH (ref 0–44)
AST: 825 U/L — ABNORMAL HIGH (ref 15–41)
Albumin: 2.9 g/dL — ABNORMAL LOW (ref 3.5–5.0)
Alkaline Phosphatase: 46 U/L (ref 38–126)
Anion gap: 5 (ref 5–15)
BUN: 9 mg/dL (ref 6–20)
CO2: 24 mmol/L (ref 22–32)
Calcium: 8.1 mg/dL — ABNORMAL LOW (ref 8.9–10.3)
Chloride: 112 mmol/L — ABNORMAL HIGH (ref 98–111)
Creatinine, Ser: 0.57 mg/dL (ref 0.44–1.00)
GFR, Estimated: >60 mL/min
Glucose, Bld: 103 mg/dL — ABNORMAL HIGH (ref 70–99)
Potassium: 4.9 mmol/L (ref 3.5–5.1)
Sodium: 141 mmol/L (ref 135–145)
Total Bilirubin: 0.5 mg/dL (ref 0.3–1.2)
Total Protein: 5.3 g/dL — ABNORMAL LOW (ref 6.5–8.1)

## 2020-05-16 LAB — TYPE AND SCREEN
ABO/RH(D): O POS
Antibody Screen: NEGATIVE
Unit division: 0

## 2020-05-16 LAB — TRIGLYCERIDES: Triglycerides: 42 mg/dL (ref <150)

## 2020-05-16 MED ORDER — POLYETHYLENE GLYCOL 3350 17 G PO PACK
17.0000 g | PACK | Freq: Every day | ORAL | Status: DC
  Administered 2020-05-16 – 2020-05-31 (×10): 17 g
  Filled 2020-05-16 (×11): qty 1

## 2020-05-16 MED ORDER — ADULT MULTIVITAMIN W/MINERALS CH
1.0000 | ORAL_TABLET | Freq: Every day | ORAL | Status: DC
  Administered 2020-05-16 – 2020-06-10 (×25): 1
  Filled 2020-05-16 (×26): qty 1

## 2020-05-16 MED ORDER — THIAMINE HCL 100 MG PO TABS
100.0000 mg | ORAL_TABLET | Freq: Every day | ORAL | Status: DC
  Administered 2020-05-18 – 2020-06-10 (×23): 100 mg
  Filled 2020-05-16 (×24): qty 1

## 2020-05-16 MED ORDER — FOLIC ACID 1 MG PO TABS
1.0000 mg | ORAL_TABLET | Freq: Every day | ORAL | Status: DC
  Administered 2020-05-17 – 2020-06-10 (×24): 1 mg
  Filled 2020-05-16 (×25): qty 1

## 2020-05-16 MED ORDER — DOCUSATE SODIUM 50 MG/5ML PO LIQD
100.0000 mg | Freq: Two times a day (BID) | ORAL | Status: DC
  Administered 2020-05-16 – 2020-05-31 (×18): 100 mg
  Filled 2020-05-16 (×22): qty 10

## 2020-05-16 MED ORDER — THIAMINE HCL 100 MG/ML IJ SOLN
100.0000 mg | Freq: Every day | INTRAMUSCULAR | Status: DC
  Administered 2020-05-17 – 2020-05-22 (×2): 100 mg via INTRAVENOUS
  Filled 2020-05-16 (×3): qty 2
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
## 2020-05-16 NOTE — Progress Notes (Signed)
Patient ID: Beth [name], female   DOB: 07/05/1989, 34 y.o.   MRN: [id number] BP (!) 121/93 (BP Location: Right Arm)   Pulse (!) 101   Temp 99.3 F (37.4 C)   Resp 16   Ht 5\' 6"  (1.676 m)   Wt 72.6 kg   SpO2 100%   BMI 25.82 kg/m  Intubated sedated. Moving extremities, localizing Perrl, +gag, cough Ct is overread as showing shear injury. These small punctate high signal lesions are not pathognomonic for what is a very severe cerebral injury. We have no evidence that Ms. Gotwalt has sustained a severe cerebral injury.  Believe etoh level contributed to and contributes to diminished exam at this time. Shear injury is a clinical entity not a radiological diagnosis.

## 2020-05-17 LAB — CBC WITH DIFFERENTIAL/PLATELET
Abs Immature Granulocytes: 0.03 10*3/uL (ref 0.00–0.07)
Basophils Absolute: 0.1 10*3/uL (ref 0.0–0.1)
Basophils Relative: 0 %
Eosinophils Absolute: 0.3 10*3/uL (ref 0.0–0.5)
Eosinophils Relative: 2 %
HCT: 29.6 % — ABNORMAL LOW (ref 36.0–46.0)
Hemoglobin: 9.6 g/dL — ABNORMAL LOW (ref 12.0–15.0)
Immature Granulocytes: 0 %
Lymphocytes Relative: 10 %
Lymphs Abs: 1.4 10*3/uL (ref 0.7–4.0)
MCH: 30.5 pg (ref 26.0–34.0)
MCHC: 32.4 g/dL (ref 30.0–36.0)
MCV: 94 fL (ref 80.0–100.0)
Monocytes Absolute: 0.8 10*3/uL (ref 0.1–1.0)
Monocytes Relative: 6 %
Neutro Abs: 11.3 10*3/uL — ABNORMAL HIGH (ref 1.7–7.7)
Neutrophils Relative %: 82 %
Platelets: 167 10*3/uL (ref 150–400)
RBC: 3.15 MIL/uL — ABNORMAL LOW (ref 3.87–5.11)
RDW: 12.8 % (ref 11.5–15.5)
WBC: 13.9 10*3/uL — ABNORMAL HIGH (ref 4.0–10.5)
nRBC: 0 % (ref 0.0–0.2)

## 2020-05-17 LAB — COMPREHENSIVE METABOLIC PANEL
ALT: 901 U/L — ABNORMAL HIGH (ref 0–44)
AST: 423 U/L — ABNORMAL HIGH (ref 15–41)
Albumin: 2.8 g/dL — ABNORMAL LOW (ref 3.5–5.0)
Alkaline Phosphatase: 52 U/L (ref 38–126)
Anion gap: 8 (ref 5–15)
BUN: 11 mg/dL (ref 6–20)
CO2: 24 mmol/L (ref 22–32)
Calcium: 8.4 mg/dL — ABNORMAL LOW (ref 8.9–10.3)
Chloride: 111 mmol/L (ref 98–111)
Creatinine, Ser: 0.6 mg/dL (ref 0.44–1.00)
GFR, Estimated: >60 mL/min (ref >60)
Glucose, Bld: 100 mg/dL — ABNORMAL HIGH (ref 70–99)
Potassium: 4 mmol/L (ref 3.5–5.1)
Sodium: 143 mmol/L (ref 135–145)
Total Bilirubin: 0.8 mg/dL (ref 0.3–1.2)
Total Protein: 5.4 g/dL — ABNORMAL LOW (ref 6.5–8.1)

## 2020-05-17 LAB — MAGNESIUM
Magnesium: 1.9 mg/dL (ref 1.7–2.4)
Magnesium: 1.9 mg/dL (ref 1.7–2.4)

## 2020-05-17 LAB — GLUCOSE, CAPILLARY
Glucose-Capillary: 87 mg/dL (ref 70–99)
Glucose-Capillary: 93 mg/dL (ref 70–99)

## 2020-05-17 LAB — PHOSPHORUS
Phosphorus: 1.8 mg/dL — ABNORMAL LOW (ref 2.5–4.6)
Phosphorus: 1.6 mg/dL — ABNORMAL LOW (ref 2.5–4.6)

## 2020-05-17 MED ORDER — VITAL HIGH PROTEIN PO LIQD
1000.0000 mL | ORAL | Status: DC
  Administered 2020-05-17: 1000 mL

## 2020-05-17 MED ORDER — PROSOURCE TF PO LIQD
45.0000 mL | Freq: Two times a day (BID) | ORAL | Status: DC
  Administered 2020-05-17 – 2020-05-18 (×3): 45 mL
  Filled 2020-05-17 (×3): qty 45

## 2020-05-17 NOTE — Progress Notes (Signed)
RT attempted SBT trial with pt. Pt placed on CPAP/PS with no effort made despite stimulation. RT placed pt back on full support settings at this time. RT will continue to monitor.

## 2020-05-17 NOTE — Progress Notes (Signed)
Patient ID: Beth [name], female   DOB: 07/05/1989, 34 y.o.   MRN: [id number] Remains intubated and responds somewhat to stimulus, not following commands, overall she is stable from a neurosurgical standpoint.
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
## 2020-05-18 LAB — CBC WITH DIFFERENTIAL/PLATELET
Abs Immature Granulocytes: 0.06 10*3/uL (ref 0.00–0.07)
Basophils Absolute: 0.1 10*3/uL (ref 0.0–0.1)
Basophils Relative: 1 %
Eosinophils Absolute: 0.3 10*3/uL (ref 0.0–0.5)
Eosinophils Relative: 2 %
HCT: 30.9 % — ABNORMAL LOW (ref 36.0–46.0)
Hemoglobin: 9.7 g/dL — ABNORMAL LOW (ref 12.0–15.0)
Immature Granulocytes: 0 %
Lymphocytes Relative: 8 %
Lymphs Abs: 1.1 10*3/uL (ref 0.7–4.0)
MCH: 30.1 pg (ref 26.0–34.0)
MCHC: 31.4 g/dL (ref 30.0–36.0)
MCV: 96 fL (ref 80.0–100.0)
Monocytes Absolute: 0.9 10*3/uL (ref 0.1–1.0)
Monocytes Relative: 6 %
Neutro Abs: 11.3 10*3/uL — ABNORMAL HIGH (ref 1.7–7.7)
Neutrophils Relative %: 83 %
Platelets: 180 10*3/uL (ref 150–400)
RBC: 3.22 MIL/uL — ABNORMAL LOW (ref 3.87–5.11)
RDW: 12.9 % (ref 11.5–15.5)
WBC: 13.6 10*3/uL — ABNORMAL HIGH (ref 4.0–10.5)
nRBC: 0 % (ref 0.0–0.2)

## 2020-05-18 LAB — MAGNESIUM
Magnesium: 2 mg/dL (ref 1.7–2.4)
Magnesium: 2 mg/dL (ref 1.7–2.4)

## 2020-05-18 LAB — COMPREHENSIVE METABOLIC PANEL
ALT: 698 U/L — ABNORMAL HIGH (ref 0–44)
AST: 232 U/L — ABNORMAL HIGH (ref 15–41)
Albumin: 2.7 g/dL — ABNORMAL LOW (ref 3.5–5.0)
Alkaline Phosphatase: 88 U/L (ref 38–126)
Anion gap: 7 (ref 5–15)
BUN: 11 mg/dL (ref 6–20)
CO2: 24 mmol/L (ref 22–32)
Calcium: 8.4 mg/dL — ABNORMAL LOW (ref 8.9–10.3)
Chloride: 112 mmol/L — ABNORMAL HIGH (ref 98–111)
Creatinine, Ser: 0.51 mg/dL (ref 0.44–1.00)
GFR, Estimated: >60 mL/min (ref >60)
Glucose, Bld: 146 mg/dL — ABNORMAL HIGH (ref 70–99)
Potassium: 3.8 mmol/L (ref 3.5–5.1)
Sodium: 143 mmol/L (ref 135–145)
Total Bilirubin: 1 mg/dL (ref 0.3–1.2)
Total Protein: 5.7 g/dL — ABNORMAL LOW (ref 6.5–8.1)

## 2020-05-18 LAB — PHOSPHORUS
Phosphorus: 1.5 mg/dL — ABNORMAL LOW (ref 2.5–4.6)
Phosphorus: 4.3 mg/dL (ref 2.5–4.6)

## 2020-05-18 MED ORDER — LORAZEPAM 2 MG/ML IJ SOLN
1.0000 mg | INTRAMUSCULAR | Status: AC | PRN
  Administered 2020-05-20: 2 mg via INTRAVENOUS
  Filled 2020-05-18: qty 2

## 2020-05-18 MED ORDER — PANTOPRAZOLE SODIUM 40 MG PO PACK
40.0000 mg | PACK | Freq: Every day | ORAL | Status: DC
  Administered 2020-05-18 – 2020-06-10 (×24): 40 mg
  Filled 2020-05-18 (×24): qty 20

## 2020-05-18 MED ORDER — ENOXAPARIN SODIUM 30 MG/0.3ML ~~LOC~~ SOLN: 30.0000 mg | Freq: Two times a day (BID) | SUBCUTANEOUS | Status: DC

## 2020-05-18 MED ORDER — METHOCARBAMOL 500 MG PO TABS
1000.0000 mg | ORAL_TABLET | Freq: Three times a day (TID) | ORAL | Status: DC
  Administered 2020-05-18 – 2020-06-02 (×44): 1000 mg
  Filled 2020-05-18 (×44): qty 2

## 2020-05-18 MED ORDER — SODIUM PHOSPHATES 45 MMOLE/15ML IV SOLN
45.0000 mmol | Freq: Once | INTRAVENOUS | Status: AC
  Administered 2020-05-18: 45 mmol via INTRAVENOUS
  Filled 2020-05-18: qty 15

## 2020-05-18 MED ORDER — LORAZEPAM 1 MG PO TABS: 1.0000 mg | ORAL_TABLET | ORAL | Status: AC | PRN

## 2020-05-18 MED ORDER — HEPARIN SODIUM (PORCINE) 5000 UNIT/ML IJ SOLN
5000.0000 [IU] | Freq: Three times a day (TID) | INTRAMUSCULAR | Status: AC
  Administered 2020-05-18 – 2020-05-21 (×11): 5000 [IU] via SUBCUTANEOUS
  Filled 2020-05-18 (×11): qty 1

## 2020-05-18 MED ORDER — MORPHINE SULFATE (PF) 2 MG/ML IV SOLN
2.0000 mg | INTRAVENOUS | Status: DC | PRN
  Administered 2020-05-22 – 2020-05-24 (×4): 4 mg via INTRAVENOUS
  Administered 2020-05-24: 2 mg via INTRAVENOUS
  Administered 2020-05-24 – 2020-05-27 (×12): 4 mg via INTRAVENOUS
  Administered 2020-05-28: 2 mg via INTRAVENOUS
  Administered 2020-05-28 (×4): 4 mg via INTRAVENOUS
  Administered 2020-05-29: 2 mg via INTRAVENOUS
  Administered 2020-05-29 (×2): 4 mg via INTRAVENOUS
  Administered 2020-05-29 – 2020-05-30 (×6): 2 mg via INTRAVENOUS
  Administered 2020-05-31: 4 mg via INTRAVENOUS
  Administered 2020-05-31 (×2): 2 mg via INTRAVENOUS
  Administered 2020-05-31 (×2): 4 mg via INTRAVENOUS
  Administered 2020-05-31 (×2): 2 mg via INTRAVENOUS
  Filled 2020-05-18: qty 2
  Filled 2020-05-18: qty 1
  Filled 2020-05-18 (×14): qty 2
  Filled 2020-05-18: qty 1
  Filled 2020-05-18: qty 2
  Filled 2020-05-18: qty 1
  Filled 2020-05-18: qty 2
  Filled 2020-05-18 (×2): qty 1
  Filled 2020-05-18: qty 2
  Filled 2020-05-18: qty 1
  Filled 2020-05-18 (×3): qty 2
  Filled 2020-05-18: qty 1
  Filled 2020-05-18 (×4): qty 2
  Filled 2020-05-18 (×4): qty 1
  Filled 2020-05-18 (×2): qty 2
  Filled 2020-05-18 (×2): qty 1

## 2020-05-18 MED ORDER — OXYCODONE HCL 5 MG/5ML PO SOLN
5.0000 mg | ORAL | Status: DC | PRN
  Administered 2020-05-18: 10 mg
  Administered 2020-05-18: 5 mg
  Administered 2020-05-19 – 2020-05-24 (×21): 10 mg
  Administered 2020-05-25: 5 mg
  Administered 2020-05-25 – 2020-05-31 (×24): 10 mg
  Filled 2020-05-18 (×8): qty 10
  Filled 2020-05-18: qty 5
  Filled 2020-05-18 (×3): qty 10
  Filled 2020-05-18: qty 5
  Filled 2020-05-18 (×35): qty 10

## 2020-05-18 MED ORDER — PIVOT 1.5 CAL PO LIQD
1000.0000 mL | ORAL | Status: DC
  Administered 2020-05-18 – 2020-06-01 (×13): 1000 mL
  Filled 2020-05-18 (×18): qty 1000

## 2020-05-18 MED ORDER — SODIUM CHLORIDE 0.9 % IV SOLN: INTRAVENOUS | Status: DC | PRN

## 2020-05-18 MED ORDER — ACETAMINOPHEN 160 MG/5ML PO SOLN
1000.0000 mg | Freq: Four times a day (QID) | ORAL | Status: DC
  Administered 2020-05-18 – 2020-06-11 (×90): 1000 mg
  Filled 2020-05-18 (×94): qty 40.6

## 2020-05-18 MED ORDER — LEVETIRACETAM IN NACL 500 MG/100ML IV SOLN
500.0000 mg | Freq: Two times a day (BID) | INTRAVENOUS | Status: AC
  Administered 2020-05-18 – 2020-05-24 (×14): 500 mg via INTRAVENOUS
  Filled 2020-05-18 (×14): qty 100

## 2020-05-18 MED ORDER — PANTOPRAZOLE SODIUM 40 MG IV SOLR: 40.0000 mg | INTRAVENOUS | Status: DC

## 2020-05-18 MED ORDER — PROSOURCE TF PO LIQD: 45.0000 mL | Freq: Three times a day (TID) | ORAL | Status: DC

## 2020-05-18 NOTE — Progress Notes (Signed)
Verbal order received to leave pt on CPAP/PS overnight as tolerated. RT will continue to monitor.
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
## 2020-05-18 NOTE — Progress Notes (Signed)
Patient ID: Beth [name], female   DOB: 07/05/1989, 34 y.o.   MRN: [id number] BP 128/83   Pulse (!) 110   Temp 98.9 F (37.2 C) (Axillary)   Resp 12   Ht 5\' 6"  (1.676 m)   Wt 74.1 kg   SpO2 100%   BMI 26.37 kg/m  Pupils small 84mm, reactive Not following commands Right side movement greater than left Remains intubated Vigorous cough  Corneals present No neurological change.
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
## 2020-05-19 LAB — CBC WITH DIFFERENTIAL/PLATELET
Abs Immature Granulocytes: 0.14 10*3/uL — ABNORMAL HIGH (ref 0.00–0.07)
Basophils Absolute: 0.1 10*3/uL (ref 0.0–0.1)
Basophils Relative: 1 %
Eosinophils Absolute: 0.6 10*3/uL — ABNORMAL HIGH (ref 0.0–0.5)
Eosinophils Relative: 4 %
HCT: 32.5 % — ABNORMAL LOW (ref 36.0–46.0)
Hemoglobin: 10.4 g/dL — ABNORMAL LOW (ref 12.0–15.0)
Immature Granulocytes: 1 %
Lymphocytes Relative: 15 %
Lymphs Abs: 2.3 10*3/uL (ref 0.7–4.0)
MCH: 30.5 pg (ref 26.0–34.0)
MCHC: 32 g/dL (ref 30.0–36.0)
MCV: 95.3 fL (ref 80.0–100.0)
Monocytes Absolute: 1.5 10*3/uL — ABNORMAL HIGH (ref 0.1–1.0)
Monocytes Relative: 10 %
Neutro Abs: 11 10*3/uL — ABNORMAL HIGH (ref 1.7–7.7)
Neutrophils Relative %: 69 %
Platelets: UNDETERMINED 10*3/uL (ref 150–400)
RBC: 3.41 MIL/uL — ABNORMAL LOW (ref 3.87–5.11)
RDW: 12.8 % (ref 11.5–15.5)
WBC: 15.7 10*3/uL — ABNORMAL HIGH (ref 4.0–10.5)
nRBC: 0.2 % (ref 0.0–0.2)

## 2020-05-19 LAB — COMPREHENSIVE METABOLIC PANEL
ALT: 474 U/L — ABNORMAL HIGH (ref 0–44)
AST: 121 U/L — ABNORMAL HIGH (ref 15–41)
Albumin: 2.7 g/dL — ABNORMAL LOW (ref 3.5–5.0)
Alkaline Phosphatase: 128 U/L — ABNORMAL HIGH (ref 38–126)
Anion gap: 11 (ref 5–15)
BUN: 14 mg/dL (ref 6–20)
CO2: 27 mmol/L (ref 22–32)
Calcium: 8.8 mg/dL — ABNORMAL LOW (ref 8.9–10.3)
Chloride: 109 mmol/L (ref 98–111)
Creatinine, Ser: 0.5 mg/dL (ref 0.44–1.00)
GFR, Estimated: >60 mL/min (ref >60)
Glucose, Bld: 104 mg/dL — ABNORMAL HIGH (ref 70–99)
Potassium: 3.5 mmol/L (ref 3.5–5.1)
Sodium: 147 mmol/L — ABNORMAL HIGH (ref 135–145)
Total Bilirubin: 0.5 mg/dL (ref 0.3–1.2)
Total Protein: 6.1 g/dL — ABNORMAL LOW (ref 6.5–8.1)

## 2020-05-19 LAB — GLUCOSE, CAPILLARY
Glucose-Capillary: 151 mg/dL — ABNORMAL HIGH (ref 70–99)
Glucose-Capillary: 106 mg/dL — ABNORMAL HIGH (ref 70–99)
Glucose-Capillary: 139 mg/dL — ABNORMAL HIGH (ref 70–99)
Glucose-Capillary: 153 mg/dL — ABNORMAL HIGH (ref 70–99)
Glucose-Capillary: 151 mg/dL — ABNORMAL HIGH (ref 70–99)
Glucose-Capillary: 192 mg/dL — ABNORMAL HIGH (ref 70–99)

## 2020-05-19 MED ORDER — BETHANECHOL CHLORIDE 10 MG PO TABS
25.0000 mg | ORAL_TABLET | Freq: Three times a day (TID) | ORAL | Status: DC
  Administered 2020-05-19 – 2020-05-20 (×6): 25 mg via ORAL
  Filled 2020-05-19 (×7): qty 3

## 2020-05-19 NOTE — ED Notes (Signed)
Patients dad Leonette Monarch called [phone]) very upset.  He states he keeps calling trying to find out what's going on with his daughter and he keeps getting the run around.  He is requesting for a doctor to call him.  He states they are not from here they are from [place] and would like for both daughters to be transferred to [place].  I advised I would contact house coverage and I also gave him Myrlene Broker the social workers phone number.  I also stated both patients are no longer in the ED.  Which he was aware he states he could not get anyone else to talk to him

## 2020-05-19 NOTE — Progress Notes (Signed)
Per Dr. Bobbye Morton, she instructed RT to continue PS/CPAP on pt throughout the night. This RT will pass instruction along to on coming night shift.

## 2020-05-19 NOTE — Progress Notes (Signed)
Patient ID: Beth [name], female   DOB: 07/05/1989, 34 y.o.   MRN: [id number] BP (!) 166/65   Pulse (!) 113   Temp 99.1 F (37.3 C) (Axillary)   Resp (!) 24   Ht 5\' 6"  (1.676 m)   Wt 74 kg   SpO2 100%   BMI 26.33 kg/m  Intubated and sedated Perrl, corneals intact +cough, +gag Not following commands Minimal withdrawal to pain in right upper extremity, moving right lower extremity more than any other limb Scalp with bogginess right frontal region.

## 2020-05-19 NOTE — Progress Notes (Signed)
Patient remain on weaning mode of ventilation PSV qhs per MD. Patient has been tolerating PSV well throughout the night w/o any signs of distress or respiratory compromise noted that would warrant terminating the weaning qhs process. Patient minute ventilation has been stable throughout the night, as well as optimize oxygenation being appropriate. No complication noted.
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
## 2020-05-20 ENCOUNTER — Inpatient Hospital Stay (HOSPITAL_COMMUNITY): Payer: Medicaid Other

## 2020-05-20 LAB — COMPREHENSIVE METABOLIC PANEL
ALT: 355 U/L — ABNORMAL HIGH (ref 0–44)
AST: 81 U/L — ABNORMAL HIGH (ref 15–41)
Albumin: 2.6 g/dL — ABNORMAL LOW (ref 3.5–5.0)
Alkaline Phosphatase: 123 U/L (ref 38–126)
Anion gap: 9 (ref 5–15)
BUN: 15 mg/dL (ref 6–20)
CO2: 24 mmol/L (ref 22–32)
Calcium: 8.8 mg/dL — ABNORMAL LOW (ref 8.9–10.3)
Chloride: 112 mmol/L — ABNORMAL HIGH (ref 98–111)
Creatinine, Ser: 0.49 mg/dL (ref 0.44–1.00)
GFR, Estimated: >60 mL/min (ref >60)
Glucose, Bld: 219 mg/dL — ABNORMAL HIGH (ref 70–99)
Potassium: 3.6 mmol/L (ref 3.5–5.1)
Sodium: 145 mmol/L (ref 135–145)
Total Bilirubin: 0.7 mg/dL (ref 0.3–1.2)
Total Protein: 5.9 g/dL — ABNORMAL LOW (ref 6.5–8.1)

## 2020-05-20 LAB — CBC WITH DIFFERENTIAL/PLATELET
Abs Immature Granulocytes: 0.15 10*3/uL — ABNORMAL HIGH (ref 0.00–0.07)
Basophils Absolute: 0.1 10*3/uL (ref 0.0–0.1)
Basophils Relative: 0 %
Eosinophils Absolute: 0.2 10*3/uL (ref 0.0–0.5)
Eosinophils Relative: 1 %
HCT: 31.7 % — ABNORMAL LOW (ref 36.0–46.0)
Hemoglobin: 10 g/dL — ABNORMAL LOW (ref 12.0–15.0)
Immature Granulocytes: 1 %
Lymphocytes Relative: 8 %
Lymphs Abs: 1.4 10*3/uL (ref 0.7–4.0)
MCH: 30.4 pg (ref 26.0–34.0)
MCHC: 31.5 g/dL (ref 30.0–36.0)
MCV: 96.4 fL (ref 80.0–100.0)
Monocytes Absolute: 1.1 10*3/uL — ABNORMAL HIGH (ref 0.1–1.0)
Monocytes Relative: 7 %
Neutro Abs: 13.9 10*3/uL — ABNORMAL HIGH (ref 1.7–7.7)
Neutrophils Relative %: 83 %
Platelets: 238 10*3/uL (ref 150–400)
RBC: 3.29 MIL/uL — ABNORMAL LOW (ref 3.87–5.11)
RDW: 12.8 % (ref 11.5–15.5)
WBC: 16.9 10*3/uL — ABNORMAL HIGH (ref 4.0–10.5)
nRBC: 0.2 % (ref 0.0–0.2)

## 2020-05-20 LAB — GLUCOSE, CAPILLARY
Glucose-Capillary: 145 mg/dL — ABNORMAL HIGH (ref 70–99)
Glucose-Capillary: 161 mg/dL — ABNORMAL HIGH (ref 70–99)
Glucose-Capillary: 177 mg/dL — ABNORMAL HIGH (ref 70–99)
Glucose-Capillary: 108 mg/dL — ABNORMAL HIGH (ref 70–99)
Glucose-Capillary: 153 mg/dL — ABNORMAL HIGH (ref 70–99)
Glucose-Capillary: 149 mg/dL — ABNORMAL HIGH (ref 70–99)

## 2020-05-20 MED ORDER — GADOBUTROL 1 MMOL/ML IV SOLN
8.5000 mL | Freq: Once | INTRAVENOUS | Status: AC | PRN
  Administered 2020-05-20: 8.5 mL via INTRAVENOUS

## 2020-05-20 MED ORDER — ENOXAPARIN SODIUM 30 MG/0.3ML ~~LOC~~ SOLN
30.0000 mg | Freq: Two times a day (BID) | SUBCUTANEOUS | Status: DC
  Administered 2020-05-22 – 2020-06-03 (×24): 30 mg via SUBCUTANEOUS
  Filled 2020-05-20 (×24): qty 0.3

## 2020-05-20 MED ORDER — POTASSIUM CHLORIDE 20 MEQ/15ML (10%) PO SOLN
40.0000 meq | Freq: Once | ORAL | Status: AC
  Administered 2020-05-20: 40 meq via ORAL
  Filled 2020-05-20: qty 30
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
## 2020-05-20 NOTE — Progress Notes (Signed)
Clinical update provided to patient's father. All questions answered.   Jesusita Oka, MD General and [facility] [place] [facility]

## 2020-05-20 NOTE — Progress Notes (Signed)
Pt transported on the ventilator from 4N22 to MRI 1. RT, RN, RN student and transport accompanied pt. VSS throughout. Pt placed on full support settings during scan due to sedation given. Upon arrival back to pt room pt able to be placed back on CPAP/PS.  Pt tolerated well. RT will continue to monitor.

## 2020-05-20 NOTE — Procedures (Signed)
Cortrak  Person Inserting Tube:  Jaleyah Longhi C, RD Tube Type:  Cortrak - 43 inches Tube Location:  Left nare Initial Placement:  Stomach Secured by: Bridle Technique Used to Measure Tube Placement:  Documented cm marking at nare/ corner of mouth Cortrak Secured At:  66 cm    Cortrak Tube Team Note:  Consult received to place a Cortrak feeding tube.   No x-ray is required. RN may begin using tube.   If the tube becomes dislodged please keep the tube and contact the Cortrak team at [email or web address] ([email or web address]) for replacement.  If after hours and replacement cannot be delayed, place a NG tube and confirm placement with an abdominal x-ray.    Retha Bither P., RD, LDN, CNSC See [email or web address] for contact information    

## 2020-05-21 LAB — GLUCOSE, CAPILLARY
Glucose-Capillary: 122 mg/dL — ABNORMAL HIGH (ref 70–99)
Glucose-Capillary: 145 mg/dL — ABNORMAL HIGH (ref 70–99)
Glucose-Capillary: 151 mg/dL — ABNORMAL HIGH (ref 70–99)
Glucose-Capillary: 121 mg/dL — ABNORMAL HIGH (ref 70–99)
Glucose-Capillary: 137 mg/dL — ABNORMAL HIGH (ref 70–99)
Glucose-Capillary: 120 mg/dL — ABNORMAL HIGH (ref 70–99)

## 2020-05-21 MED ORDER — BETHANECHOL CHLORIDE 25 MG PO TABS
25.0000 mg | ORAL_TABLET | Freq: Three times a day (TID) | ORAL | Status: DC
  Administered 2020-05-21 – 2020-05-31 (×32): 25 mg
  Filled 2020-05-21: qty 3
  Filled 2020-05-21 (×2): qty 1
  Filled 2020-05-21 (×2): qty 3
  Filled 2020-05-21: qty 1
  Filled 2020-05-21 (×11): qty 3
  Filled 2020-05-21: qty 1
  Filled 2020-05-21 (×2): qty 3
  Filled 2020-05-21: qty 1
  Filled 2020-05-21 (×2): qty 3
  Filled 2020-05-21 (×3): qty 1
  Filled 2020-05-21: qty 3
  Filled 2020-05-21: qty 1
  Filled 2020-05-21 (×3): qty 3
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
## 2020-05-22 ENCOUNTER — Inpatient Hospital Stay (HOSPITAL_COMMUNITY): Payer: Medicaid Other

## 2020-05-22 ENCOUNTER — Encounter (HOSPITAL_COMMUNITY): Admission: EM | Disposition: A | Discharge status: Rehabilitation Facility | Payer: Self-pay | Source: Home / Self Care

## 2020-05-22 ENCOUNTER — Inpatient Hospital Stay (HOSPITAL_COMMUNITY): Payer: Medicaid Other | Admitting: Certified Registered Nurse Anesthetist

## 2020-05-22 HISTORY — PX: PEG PLACEMENT: SHX5437

## 2020-05-22 HISTORY — PX: ORIF ULNAR FRACTURE: SHX5417

## 2020-05-22 HISTORY — PX: TRACHEOSTOMY TUBE PLACEMENT: SHX814

## 2020-05-22 LAB — GLUCOSE, CAPILLARY
Glucose-Capillary: 100 mg/dL — ABNORMAL HIGH (ref 70–99)
Glucose-Capillary: 102 mg/dL — ABNORMAL HIGH (ref 70–99)
Glucose-Capillary: 119 mg/dL — ABNORMAL HIGH (ref 70–99)
Glucose-Capillary: 131 mg/dL — ABNORMAL HIGH (ref 70–99)
Glucose-Capillary: 97 mg/dL (ref 70–99)

## 2020-05-22 SURGERY — OPEN REDUCTION INTERNAL FIXATION (ORIF) ULNAR FRACTURE
Anesthesia: General | Site: Neck

## 2020-05-22 SURGERY — EGD (ESOPHAGOGASTRODUODENOSCOPY)
Anesthesia: Moderate Sedation

## 2020-05-22 MED ORDER — ONDANSETRON HCL 4 MG/2ML IJ SOLN
INTRAMUSCULAR | Status: AC
  Filled 2020-05-22: qty 2

## 2020-05-22 MED ORDER — FENTANYL CITRATE (PF) 250 MCG/5ML IJ SOLN
INTRAMUSCULAR | Status: AC
  Filled 2020-05-22: qty 5

## 2020-05-22 MED ORDER — MIDAZOLAM HCL 2 MG/2ML IJ SOLN
INTRAMUSCULAR | Status: AC
  Filled 2020-05-22: qty 2

## 2020-05-22 MED ORDER — ESMOLOL HCL 100 MG/10ML IV SOLN
INTRAVENOUS | Status: AC
  Filled 2020-05-22: qty 10

## 2020-05-22 MED ORDER — ALBUMIN HUMAN 5 % IV SOLN: INTRAVENOUS | Status: DC | PRN

## 2020-05-22 MED ORDER — 0.9 % SODIUM CHLORIDE (POUR BTL) OPTIME
TOPICAL | Status: DC | PRN
  Administered 2020-05-22 (×2): 1000 mL

## 2020-05-22 MED ORDER — DEXAMETHASONE SODIUM PHOSPHATE 10 MG/ML IJ SOLN
INTRAMUSCULAR | Status: DC | PRN
  Administered 2020-05-22: 5 mg via INTRAVENOUS

## 2020-05-22 MED ORDER — PHENYLEPHRINE 40 MCG/ML (10ML) SYRINGE FOR IV PUSH (FOR BLOOD PRESSURE SUPPORT)
PREFILLED_SYRINGE | INTRAVENOUS | Status: AC
  Filled 2020-05-22: qty 10

## 2020-05-22 MED ORDER — CHLORHEXIDINE GLUCONATE 4 % EX LIQD: 60.0000 mL | Freq: Once | CUTANEOUS | Status: DC

## 2020-05-22 MED ORDER — MIDAZOLAM HCL 2 MG/2ML IJ SOLN
INTRAMUSCULAR | Status: DC | PRN
  Administered 2020-05-22: 2 mg via INTRAVENOUS

## 2020-05-22 MED ORDER — PHENYLEPHRINE HCL-NACL 10-0.9 MG/250ML-% IV SOLN
INTRAVENOUS | Status: DC | PRN
  Administered 2020-05-22: 40 ug/min via INTRAVENOUS

## 2020-05-22 MED ORDER — ROCURONIUM BROMIDE 10 MG/ML (PF) SYRINGE
PREFILLED_SYRINGE | INTRAVENOUS | Status: AC
  Filled 2020-05-22: qty 20

## 2020-05-22 MED ORDER — CEFAZOLIN SODIUM-DEXTROSE 2-4 GM/100ML-% IV SOLN
2.0000 g | INTRAVENOUS | Status: AC
  Administered 2020-05-22: 2 g via INTRAVENOUS

## 2020-05-22 MED ORDER — POVIDONE-IODINE 10 % EX SWAB: 2.0000 "application " | Freq: Once | CUTANEOUS | Status: DC

## 2020-05-22 MED ORDER — ROCURONIUM BROMIDE 10 MG/ML (PF) SYRINGE
PREFILLED_SYRINGE | INTRAVENOUS | Status: DC | PRN
  Administered 2020-05-22 (×4): 50 mg via INTRAVENOUS

## 2020-05-22 MED ORDER — FENTANYL CITRATE (PF) 250 MCG/5ML IJ SOLN
INTRAMUSCULAR | Status: DC | PRN
  Administered 2020-05-22 (×8): 50 ug via INTRAVENOUS

## 2020-05-22 MED ORDER — VANCOMYCIN HCL 1000 MG IV SOLR
INTRAVENOUS | Status: AC
  Filled 2020-05-22: qty 1000

## 2020-05-22 MED ORDER — ONDANSETRON HCL 4 MG/2ML IJ SOLN
INTRAMUSCULAR | Status: DC | PRN
  Administered 2020-05-22: 4 mg via INTRAVENOUS

## 2020-05-22 MED ORDER — PROPOFOL 10 MG/ML IV BOLUS
INTRAVENOUS | Status: AC
  Filled 2020-05-22: qty 20

## 2020-05-22 MED ORDER — VANCOMYCIN HCL 1000 MG IV SOLR
INTRAVENOUS | Status: DC | PRN
  Administered 2020-05-22: 1000 mg

## 2020-05-22 MED ORDER — LACTATED RINGERS IV SOLN: INTRAVENOUS | Status: DC | PRN

## 2020-05-22 SURGICAL SUPPLY — 80 items
ADH SKN CLS APL DERMABOND .7 (GAUZE/BANDAGES/DRESSINGS) ×3
BINDER ABDOMINAL 12 XL 75-84 (SOFTGOODS) ×4 IMPLANT
BIT DRILL L45 QC 2.7X125 (BIT) ×3 IMPLANT
BIT DRILL QC 2.5X135 (BIT) ×4 IMPLANT
BIT DRILL QC 2.7X125 (BIT) ×4
BIT DRILL QC 2X140 (BIT) ×4 IMPLANT
BLADE CLIPPER SURG (BLADE) IMPLANT
BNDG CMPR 9X4 STRL LF SNTH (GAUZE/BANDAGES/DRESSINGS) ×3
BNDG ELASTIC 3X5.8 VLCR STR LF (GAUZE/BANDAGES/DRESSINGS) ×4 IMPLANT
BNDG ELASTIC 4X5.8 VLCR STR LF (GAUZE/BANDAGES/DRESSINGS) ×4 IMPLANT
BNDG ESMARK 4X9 LF (GAUZE/BANDAGES/DRESSINGS) ×4 IMPLANT
BNDG GAUZE ELAST 4 BULKY (GAUZE/BANDAGES/DRESSINGS) ×4 IMPLANT
CANISTER SUCT 3000ML PPV (MISCELLANEOUS) ×4 IMPLANT
COVER SURGICAL LIGHT HANDLE (MISCELLANEOUS) ×8 IMPLANT
COVER WAND RF STERILE (DRAPES) ×8 IMPLANT
CUFF TOURN SGL QUICK 18X4 (TOURNIQUET CUFF) IMPLANT
CUFF TOURN SGL QUICK 24 (TOURNIQUET CUFF)
CUFF TRNQT CYL 24X4X16.5-23 (TOURNIQUET CUFF) IMPLANT
DECANTER SPIKE VIAL GLASS SM (MISCELLANEOUS) ×4 IMPLANT
DERMABOND ADVANCED (GAUZE/BANDAGES/DRESSINGS) ×1
DERMABOND ADVANCED .7 DNX12 (GAUZE/BANDAGES/DRESSINGS) ×3 IMPLANT
DRAPE C-ARM 35X43 STRL (DRAPES) IMPLANT
DRAPE U-SHAPE 47X51 STRL (DRAPES) ×4 IMPLANT
DRAPE UTILITY XL STRL (DRAPES) ×4 IMPLANT
DRSG EMULSION OIL 3X3 NADH (GAUZE/BANDAGES/DRESSINGS) ×8 IMPLANT
DRSG MEPILEX BORDER 4X8 (GAUZE/BANDAGES/DRESSINGS) ×4 IMPLANT
ELECT CAUTERY BLADE 6.4 (BLADE) ×4 IMPLANT
ELECT REM PT RETURN 9FT ADLT (ELECTROSURGICAL) ×4
ELECTRODE REM PT RTRN 9FT ADLT (ELECTROSURGICAL) ×3 IMPLANT
GAUZE 4X4 16PLY RFD (DISPOSABLE) ×4 IMPLANT
GAUZE SPONGE 4X4 12PLY STRL (GAUZE/BANDAGES/DRESSINGS) ×4 IMPLANT
GAUZE XEROFORM 1X8 LF (GAUZE/BANDAGES/DRESSINGS) ×4 IMPLANT
GLOVE BIO SURGEON STRL SZ 6.5 (GLOVE) ×8 IMPLANT
GLOVE BIO SURGEON STRL SZ7.5 (GLOVE) ×4 IMPLANT
GLOVE BIOGEL PI IND STRL 6 (GLOVE) ×3 IMPLANT
GLOVE BIOGEL PI IND STRL 7.0 (GLOVE) ×3 IMPLANT
GLOVE BIOGEL PI IND STRL 7.5 (GLOVE) ×3 IMPLANT
GLOVE BIOGEL PI INDICATOR 6 (GLOVE) ×1
GLOVE BIOGEL PI INDICATOR 7.0 (GLOVE) ×1
GLOVE BIOGEL PI INDICATOR 7.5 (GLOVE) ×1
GOWN STRL REUS W/ TWL LRG LVL3 (GOWN DISPOSABLE) ×9 IMPLANT
GOWN STRL REUS W/TWL LRG LVL3 (GOWN DISPOSABLE) ×12
INTRODUCER TRACH BLUE RHINO 6F (TUBING) IMPLANT
INTRODUCER TRACH BLUE RHINO 8F (TUBING) IMPLANT
KIT BASIN OR (CUSTOM PROCEDURE TRAY) ×8 IMPLANT
KIT TURNOVER KIT B (KITS) ×8 IMPLANT
MANIFOLD NEPTUNE II (INSTRUMENTS) ×4 IMPLANT
NEEDLE 22X1 1/2 (OR ONLY) (NEEDLE) IMPLANT
NEEDLE HYPO 25GX1X1/2 BEV (NEEDLE) ×8 IMPLANT
NS IRRIG 1000ML POUR BTL (IV SOLUTION) ×8 IMPLANT
PACK EENT II TURBAN DRAPE (CUSTOM PROCEDURE TRAY) ×4 IMPLANT
PACK ORTHO EXTREMITY (CUSTOM PROCEDURE TRAY) ×4 IMPLANT
PAD ARMBOARD 7.5X6 YLW CONV (MISCELLANEOUS) ×16 IMPLANT
PAD CAST 3X4 CTTN HI CHSV (CAST SUPPLIES) ×3 IMPLANT
PAD CAST 4YDX4 CTTN HI CHSV (CAST SUPPLIES) ×3 IMPLANT
PADDING CAST COTTON 3X4 STRL (CAST SUPPLIES) ×4
PADDING CAST COTTON 4X4 STRL (CAST SUPPLIES) ×4
PENCIL BUTTON HOLSTER BLD 10FT (ELECTRODE) ×4 IMPLANT
PROS LCP PLATE 8H 111M (Plate) ×4 IMPLANT
PROSTHESIS LCP PLATE 8H 111M (Plate) ×3 IMPLANT
SCREW CORTEX 2.7X16MM (Screw) ×4 IMPLANT
SCREW CORTEX 2.7X18MM (Screw) ×4 IMPLANT
SCREW CORTEX 3.5 16MM (Screw) ×3 IMPLANT
SCREW CORTEX 3.5 18MM (Screw) ×2 IMPLANT
SCREW CORTEX 3.5 26MM (Screw) ×1 IMPLANT
SCREW LOCK CORT ST 3.5X16 (Screw) ×9 IMPLANT
SCREW LOCK CORT ST 3.5X18 (Screw) ×6 IMPLANT
SCREW LOCK CORT ST 3.5X26 (Screw) ×3 IMPLANT
SPONGE INTESTINAL PEANUT (DISPOSABLE) ×4 IMPLANT
SUT VICRYL AB 3 0 TIES (SUTURE) ×4 IMPLANT
SYR 20ML LL LF (SYRINGE) ×4 IMPLANT
SYR CONTROL 10ML LL (SYRINGE) ×4 IMPLANT
TOWEL GREEN STERILE (TOWEL DISPOSABLE) ×8 IMPLANT
TOWEL GREEN STERILE FF (TOWEL DISPOSABLE) ×8 IMPLANT
TRAY W/TRACH TUBE 7.5 (MISCELLANEOUS) ×4 IMPLANT
TUBE CONNECTING 12X1/4 (SUCTIONS) ×8 IMPLANT
TUBE TRACH  6.0 CUFF FLEX (MISCELLANEOUS) ×1
TUBE TRACH 6.0 CUFF FLEX (MISCELLANEOUS) ×3 IMPLANT
WATER STERILE IRR 1000ML POUR (IV SOLUTION) ×4 IMPLANT
YANKAUER SUCT BULB TIP NO VENT (SUCTIONS) IMPLANT

## 2020-05-22 NOTE — Progress Notes (Signed)
Pts vent mode changed to full support due to pt returning from surgery and being paralyzed at this time. RN aware.

## 2020-05-22 NOTE — Anesthesia Postprocedure Evaluation (Signed)
Anesthesia Post Note  Patient: Beth [name]  Procedure(s) Performed: OPEN REDUCTION INTERNAL FIXATION (ORIF) ULNAR FRACTURE (Left Arm Lower) TRACHEOSTOMY (N/A Neck) PERCUTANEOUS ENDOSCOPIC GASTROSTOMY (PEG) PLACEMENT (N/A Abdomen)     Patient location during evaluation: ICU Anesthesia Type: General Level of consciousness: sedated and patient remains intubated per anesthesia plan Pain management: pain level controlled Vital Signs Assessment: post-procedure vital signs reviewed and stable Respiratory status: patient on ventilator - see flowsheet for VS (fresh trach) Cardiovascular status: blood pressure returned to baseline and stable Postop Assessment: no apparent nausea or vomiting Anesthetic complications: no Comments: Remains critically ill   No complications documented.  Last Vitals:  Vitals:   05/22/20 1326 05/22/20 1330  BP:  (!) 148/88  Pulse: (!) 115 (!) 109  Resp: 18 16  Temp:    SpO2: 100% 98%    Last Pain:  Vitals:   05/22/20 0800  TempSrc: Axillary  PainSc:                  Demetria Lightsey,E. Gemini Beaumier
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
## 2020-05-22 NOTE — Progress Notes (Signed)
Ortho Trauma Note  I was asked to take over this patient's care for her left Monteggia fracture dislocation.  Patient has a head injury along with a left comminuted ulna fracture with associated radial head fracture and dislocation.  Due to the patient's unstable nature of her arm and continued dislocation of her radial head I recommend proceeding to the operating room for open reduction internal fixation of the left ulna with a closed reduction versus open reduction of the radial head.  I did discuss risks and benefits with both the patient's mother and father.  This was done over the phone.  Risks included but not limited to bleeding, infection, malunion, nonunion, hardware failure, hardware irritation, nerve and blood vessel injury, posttraumatic arthritis, heterotopic ossification, elbow stiffness, even the possibility anesthetic complications.  They agreed to proceed with surgery and consent was obtained.  We were trying to coordinate the ability to perform the PEG and trach with Dr. Bobbye Morton at the same time.  Unfortunately it appears that she is in the emergency surgery and we will need to potentially delay the trach and PEG for another time or day.  Shona Needles, MD [facility] [phone] (office) [email or web address]

## 2020-05-22 NOTE — Progress Notes (Signed)
Post-procedure CXR reviewed. Free air noted under right hemidiaphragm. Given difficulty with tracheostomy tube placement, I suspect this may be air tracking down. She is also s/p PEG placement and this could be related as well. Will plan to repeat AXR in AM, if worse, plan to CT A/P with IV/PO contrast.   Jesusita Oka, MD [facility]

## 2020-05-22 NOTE — Progress Notes (Signed)
Patient ID: Beth [name], female   DOB: 07/05/1989, 34 y.o.   MRN: [id number] BP (!) 148/88   Pulse (!) 109   Temp 100.1 F (37.8 C) (Axillary)   Resp 16   Ht 5\' 6"  (1.676 m)   Wt 80 kg   SpO2 98%   BMI 28.47 kg/m  Purposeful movements right upper extremity. Sat up in bed with stimulation, does not open eyes Breathing on her own, Perrl, full eom As sedation is decreased and wears off, expect improvement in the exam.

## 2020-05-22 NOTE — Transfer of Care (Signed)
Immediate Anesthesia Transfer of Care Note  Patient: Takera Romer  Procedure(s) Performed: OPEN REDUCTION INTERNAL FIXATION (ORIF) ULNAR FRACTURE (Left Arm Lower) TRACHEOSTOMY (N/A Neck) PERCUTANEOUS ENDOSCOPIC GASTROSTOMY (PEG) PLACEMENT (N/A Abdomen)  Patient Location: ICU  Anesthesia Type:General  Level of Consciousness: Patient remains intubated per anesthesia plan  Airway & Oxygen Therapy: Patient remains intubated per anesthesia plan and Patient placed on Ventilator (see vital sign flow sheet for setting)  Post-op Assessment: Report given to RN and Post -op Vital signs reviewed and stable  Post vital signs: Reviewed and stable  Last Vitals:  Vitals Value Taken Time  BP 128/86 05/22/20 1323  Temp    Pulse 120 05/22/20 1323  Resp 16 05/22/20 1323  SpO2 93 % 05/22/20 1323  Vitals shown include unvalidated device data.  Last Pain:  Vitals:   05/22/20 0800  TempSrc: Axillary  PainSc:          Complications: No complications documented.

## 2020-05-22 NOTE — Progress Notes (Addendum)
Subjective: Intubated.  Trach today.  Per nsg will move right UE.  Not following commands.   Objective: Vital signs in last 24 hours: Temp:  [99.1 F (37.3 C)-100.1 F (37.8 C)] 100.1 F (37.8 C) (10/29 0800) Pulse Rate:  [97-128] 109 (10/29 1330) Resp:  [12-23] 16 (10/29 1330) BP: (107-148)/(61-88) 148/88 (10/29 1330) SpO2:  [97 %-100 %] 98 % (10/29 1330) FiO2 (%):  [30 %-40 %] 40 % (10/29 1326) Weight:  [80 kg] 80 kg (10/29 0500)  Intake/Output from previous day: 10/28 0701 - 10/29 0700 In: 926.2 [I.V.:125.2; NG/GT:578; IV Piggyback:223] Out: 6503 [Urine:1650] Intake/Output this shift: Total I/O In: 865.7 [I.V.:615.7; IV Piggyback:250] Out: 230 [Urine:180; Blood:50]  Recent Labs    05/20/20 0113  HGB 10.0*   Recent Labs    05/20/20 0113  WBC 16.9*  RBC 3.29*  HCT 31.7*  PLT 238   Recent Labs    05/20/20 0113  NA 145  K 3.6  CL 112*  CO2 24  BUN 15  CREATININE 0.49  GLUCOSE 219*  CALCIUM 8.8*   No results for input(s): LABPT, INR in the last 72 hours.  Right hand: Intact capillary refill fingertips.  Maceration between ring and small fingers.  Wound c/d/i.  No erythema.  Dressing changed.    Assessment/Plan: Right small proximal phalanx fracture and ring metacarpal neck fracture.  Will ask OT to make splint.  Check XR after splint.  Plan continued non op treatment as long as XR still with minimal displacement.     Beth [name] 05/22/2020, 3:42 PM
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
## 2020-05-22 NOTE — Op Note (Signed)
Operative Note  Date: 05/22/2020  Procedure: percutaneous tracheostomy with bronchoscopic assistance; esophagogastroduodenoscopy (EGD) and percutaneous endoscopic gastrostomy (PEG) tube placement  Pre-op diagnosis: traumatic brain injury, dysphagia Post-op diagnosis: same  Indication and clinical history: The patient  is a 34 y.o. year old female with traumatic brain injury  Surgeon: Jesusita Oka, MD Assistant: Maxwell Caul, [place]  Anesthesia: general  Findings:  . Specimen: none . EBL: <10cc  . Drains/Implants: #6 Shiley cuffed tracheostomy tube, PEG tube, 3cm at the skin   Disposition: ICU  Description of procedure: The patient was positioned supine with a shoulder roll. Time-out was performed verifying correct patient, procedure, signature of informed consent, and pre-operative antibiotics as indicated. MAC induction was uneventful and the patient was confirmed to be on 100% FiO2. The neck was prepped and draped in the usual sterile fashion. Palpation of neck anatomy was performed. A longitudinal incision was made in the neck and deepened down to the trachea.   The pilot balloon was deflated and the endotracheal tube slowly retracted proximally until the tip of the endotracheal tube was palpated just below the cricoid cartilage. An introducer needle was inserted between the second and third tracheal rings. A guidewire was passed through the introducer needle and the needle removed. Serial dilation was performed and a #6 cuffed Shiley and stylet were inserted over the guidewire and the guidewire and stylet removed. The tube seemed to be improperly positioned based on tactile feeback, so a bronchoscope was inserted into the tracheostomy. There was no clear ability to visualize the carina, so the tracheostomy tube was removed.  A bronchoscope was then introduced into the endotracheal tube and positioned at the distal tip of the tube. The bronchoscopic light was visualized just below the  cricoid cartilage and tracheotomy site easily visualized. The introducer needle and stylet were re-inserted through the existing tracheotomy site and a guidewire was passed through the stylet after removal of the introducer needle. A #6 cuffed Shiley and stylet were inserted over the guidewire and the guidewire and stylet removed. The inner cannula was inserted, the pilot balloon on the tracheostomy inflated, and the ventilator tubing disconnected from the endotracheal tube and connected to the tracheostomy. Chest rise, appropriate end-tidal CO2, and return tidal volumes were confirmed. Bronchoscopic evaluation was performed via the tracheostomy and confirmed adequate tracheostomy positioning above the carina. The tracheostomy tube was sutured in four quadrants and a tracheostomy tie applied to the neck. The orotracheal tube was removed. All sponge and instrument counts were correct at the conclusion of the procedure. The patient tolerated the procedure well. There were no complications. Post-procedure chest x-ray was ordered to confirm tube position and the absence of a pneumothorax.  Next, a bite block was placed into the oropharynx. The endoscope was inserted into the oropharynx and advanced down the esophagus into the stomach and into the duodenum. The visualized esophagus and duodenum were unremarkable. The endoscope was retracted back into the stomach and the stomach was insufflated. The stomach was inspected and was also normal other than some sub-centimeter sessile gastric polyps. Transillumination was performed. The light was visible on the external skin and dimpling of the stomach was noted endoscopically with manual pressure. The abdomen was prepped and draped in the usual sterile fashion. Transillumination and dimpling were repeated and local anesthetic was infiltrated to make a skin wheal at the site of transillumination. The needle was inserted perpendicularly to the skin and the tip of the needle  was visualized endoscopically. As the needle was  retracted, the tract was also anesthetized. A skin nick was made at the site of the wheal and an introducer needle and sheath were inserted. The needle was removed and guidewire inserted. The guidewire was grasped by an endoscopic snare and the snare, guidewire, and endoscope retracted out of the oropharynx. The PEG tube was secured to the guidewire and retracted through the mouth and esophagus into the stomach. The PEG tube was secured with a bolster and was visualized endoscopically to spin freely circumferentially and also be without gaps between the internal bumper and the stomach wall. There was no evidence of bleeding. The PEG bolster was secured at 3cm at the skin and there were no gaps between the bolster and the abdominal wall. The stomach was desufflated endoscopically and the endoscope removed. The bite block was also removed. The patient tolerated the procedure well and there were no complications.   The patient may have water and medications administered via the PEG tube beginning immediately and tube feeds may be initiated four hours post-procedure.    Jesusita Oka, MD General and [facility]
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
## 2020-05-23 ENCOUNTER — Inpatient Hospital Stay (HOSPITAL_COMMUNITY): Payer: Medicaid Other

## 2020-05-23 DIAGNOSIS — S069X9A Unspecified intracranial injury with loss of consciousness of unspecified duration, initial encounter: Secondary | ICD-10-CM

## 2020-05-23 DIAGNOSIS — S36113A Laceration of liver, unspecified degree, initial encounter: Secondary | ICD-10-CM

## 2020-05-23 DIAGNOSIS — S069XAA Unspecified intracranial injury with loss of consciousness status unknown, initial encounter: Secondary | ICD-10-CM

## 2020-05-23 DIAGNOSIS — S52272A Monteggia's fracture of left ulna, initial encounter for closed fracture: Secondary | ICD-10-CM

## 2020-05-23 DIAGNOSIS — S2220XA Unspecified fracture of sternum, initial encounter for closed fracture: Secondary | ICD-10-CM

## 2020-05-23 LAB — GLUCOSE, CAPILLARY
Glucose-Capillary: 112 mg/dL — ABNORMAL HIGH (ref 70–99)
Glucose-Capillary: 113 mg/dL — ABNORMAL HIGH (ref 70–99)
Glucose-Capillary: 158 mg/dL — ABNORMAL HIGH (ref 70–99)
Glucose-Capillary: 137 mg/dL — ABNORMAL HIGH (ref 70–99)
Glucose-Capillary: 112 mg/dL — ABNORMAL HIGH (ref 70–99)
Glucose-Capillary: 159 mg/dL — ABNORMAL HIGH (ref 70–99)
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
## 2020-05-23 NOTE — Progress Notes (Signed)
Pt placed on PSV 5/5 and is tolerating well at this time. RN aware. RT to continue to monitor.

## 2020-05-23 NOTE — Progress Notes (Signed)
Pt placed on 35% ATC and is tolerating well at this time. RN aware. RT to continue to monitor.
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
## 2020-05-23 NOTE — Progress Notes (Signed)
Subjective: Patient reports Remains obtunded and trach  Objective: Vital signs in last 24 hours: Temp:  [97.8 F (36.6 C)-100.7 F (38.2 C)] 99 F (37.2 C) (10/30 0400) Pulse Rate:  [96-134] 119 (10/30 0700) Resp:  [12-25] 19 (10/30 0700) BP: (107-175)/(61-120) 166/120 (10/30 0700) SpO2:  [96 %-100 %] 100 % (10/30 0700) FiO2 (%):  [30 %-40 %] 30 % (10/30 0346) Weight:  [80 kg] 80 kg (10/30 0500)  Intake/Output from previous day: 10/29 0701 - 10/30 0700 In: 1185.7 [I.V.:695.7; NG/GT:240; IV Piggyback:250] Out: 4132 [Urine:1705; Blood:50] Intake/Output this shift: No intake/output data recorded.  Semipurposeful nonpurposeful right left hand parapsoas does not follow commands  Lab Results: No results for input(s): WBC, HGB, HCT, PLT in the last 72 hours. BMET No results for input(s): NA, K, CL, CO2, GLUCOSE, BUN, CREATININE, CALCIUM in the last 72 hours.  Studies/Results: DG Elbow Complete Left  Result Date: 05/22/2020 CLINICAL DATA:  LEFT ulnar fracture, ORIF EXAM: LEFT ELBOW - COMPLETE 3+ VIEW; DG C-ARM 1-60 MIN COMPARISON:  05/15/2020 FLUOROSCOPY TIME:  0 minutes 29 seconds Dose: 1.11 mGy Images: 6 FINDINGS: Images demonstrate reduction of previously identified proximal LEFT ulnar diaphyseal fracture. A dorsal plate and multiple screws have been placed across the fracture. No dislocation identified. Deformity of the LEFT radial head secondary to displaced fracture as previously noted. Humerus appears intact. Joint spaces preserved. IMPRESSION: Post ORIF of proximal LEFT ulnar diaphyseal fracture. Electronically Signed   By: Lavonia Dana M.D.   On: 05/22/2020 11:58   DG Forearm Left  Result Date: 05/22/2020 CLINICAL DATA:  Postop LEFT forearm fracture repair EXAM: LEFT FOREARM - 2 VIEW COMPARISON:  Portable exam 1357 hours compared to earlier intraoperative images of 05/22/2020 FINDINGS: Dorsal plate and multiple screws identified at reduced comminuted fracture of the proximal  LEFT ulnar diaphysis. No additional fracture or dislocation seen. Previously identified radial head fracture is not evident. IMPRESSION: Post ORIF of previously identified proximal LEFT ulnar diaphyseal fracture. Electronically Signed   By: Lavonia Dana M.D.   On: 05/22/2020 14:49   DG CHEST PORT 1 VIEW  Result Date: 05/22/2020 CLINICAL DATA:  Tracheostomy placement EXAM: PORTABLE CHEST 1 VIEW COMPARISON:  CT 05/15/2020, radiograph 05/16/2020 FINDINGS: Tracheostomy tube tip terminates in the trachea approximately 2.1 cm from the carina. Telemetry leads and external support devices overlie the chest. Increasing opacity within the right upper lobe with associated volume loss and atelectasis. Additional opacity seen silhouetting the superior right mediastinal margin albeit with some tortuosity of the brachiocephalic vascular which is present on comparison CT. Additional atelectatic changes elsewhere in the lungs. No visible residual pneumothorax or effusion. Cardiomediastinal contours are stable from prior counting for differences in technique. There is a crescentic air lucency subjacent to the right diaphragm compatible with free air. Sternal fracture seen on comparison CT imaging is poorly visualized. No other acute osseous abnormality. IMPRESSION: 1. Tracheostomy tube tip terminates in the trachea approximately 2.1 cm from the carina. 2. Increasing opacity within the right upper lobe with associated atelectatic volume loss. 3. Crescentic air lucency subjacent to the right diaphragm compatible with free air, correlate with abdominal symptoms or procedural intervention. These results will be called to the ordering clinician or representative by the Radiologist Assistant, and communication documented in the PACS or [employer]. Electronically Signed   By: Lovena Le M.D.   On: 05/22/2020 15:24   DG ABD ACUTE 2+V W 1V CHEST  Result Date: 05/23/2020 CLINICAL DATA:  Pneumoperitoneum EXAM: DG ABDOMEN ACUTE  WITH 1 VIEW CHEST COMPARISON:  Chest radiograph 05/22/2020 FINDINGS: Tracheostomy device is present. Increased elevation of the right hemidiaphragm. Increased density at the right upper lobe may reflect upper lobe collapse. Atelectasis/consolidation at the left lung base again identified. Gastrostomy tube is present. Pneumoperitoneum is again identified adjacent to the stomach likely similar to the prior chest radiograph. Bowel gas pattern is otherwise unremarkable. IMPRESSION: Pneumoperitoneum again identified adjacent to the stomach likely similar to the prior chest radiograph. Increased elevation of the right hemidiaphragm. Increased density at the right upper lobe may reflect upper lobe collapse. Left lung base atelectasis/consolidation. Electronically Signed   By: Macy Mis M.D.   On: 05/23/2020 07:22   DG C-Arm 1-60 Min  Result Date: 05/22/2020 CLINICAL DATA:  LEFT ulnar fracture, ORIF EXAM: LEFT ELBOW - COMPLETE 3+ VIEW; DG C-ARM 1-60 MIN COMPARISON:  05/15/2020 FLUOROSCOPY TIME:  0 minutes 29 seconds Dose: 1.11 mGy Images: 6 FINDINGS: Images demonstrate reduction of previously identified proximal LEFT ulnar diaphyseal fracture. A dorsal plate and multiple screws have been placed across the fracture. No dislocation identified. Deformity of the LEFT radial head secondary to displaced fracture as previously noted. Humerus appears intact. Joint spaces preserved. IMPRESSION: Post ORIF of proximal LEFT ulnar diaphyseal fracture. Electronically Signed   By: Lavonia Dana M.D.   On: 05/22/2020 11:58    Assessment/Plan: Stable neurologically continue supportive care  LOS: 8 days     Elaina Hoops 05/23/2020, 7:42 AM

## 2020-05-24 ENCOUNTER — Inpatient Hospital Stay (HOSPITAL_COMMUNITY): Payer: Medicaid Other

## 2020-05-24 LAB — CBC
HCT: 32.3 % — ABNORMAL LOW (ref 36.0–46.0)
Hemoglobin: 10.1 g/dL — ABNORMAL LOW (ref 12.0–15.0)
MCH: 29.9 pg (ref 26.0–34.0)
MCHC: 31.3 g/dL (ref 30.0–36.0)
MCV: 95.6 fL (ref 80.0–100.0)
Platelets: 537 10*3/uL — ABNORMAL HIGH (ref 150–400)
RBC: 3.38 MIL/uL — ABNORMAL LOW (ref 3.87–5.11)
RDW: 12.8 % (ref 11.5–15.5)
WBC: 19.7 10*3/uL — ABNORMAL HIGH (ref 4.0–10.5)
nRBC: 0.1 % (ref 0.0–0.2)

## 2020-05-24 LAB — GLUCOSE, CAPILLARY
Glucose-Capillary: 128 mg/dL — ABNORMAL HIGH (ref 70–99)
Glucose-Capillary: 130 mg/dL — ABNORMAL HIGH (ref 70–99)
Glucose-Capillary: 131 mg/dL — ABNORMAL HIGH (ref 70–99)
Glucose-Capillary: 145 mg/dL — ABNORMAL HIGH (ref 70–99)
Glucose-Capillary: 165 mg/dL — ABNORMAL HIGH (ref 70–99)
Glucose-Capillary: 188 mg/dL — ABNORMAL HIGH (ref 70–99)

## 2020-05-24 LAB — VITAMIN D 25 HYDROXY (VIT D DEFICIENCY, FRACTURES): Vit D, 25-Hydroxy: 23.71 ng/mL — ABNORMAL LOW (ref 30–100)

## 2020-05-24 MED ORDER — CHLORHEXIDINE GLUCONATE 0.12 % MT SOLN
15.0000 mL | Freq: Two times a day (BID) | OROMUCOSAL | Status: DC
  Administered 2020-05-24 – 2020-06-11 (×37): 15 mL via OROMUCOSAL
  Filled 2020-05-24 (×34): qty 15

## 2020-05-24 MED ORDER — LABETALOL HCL 5 MG/ML IV SOLN
5.0000 mg | INTRAVENOUS | Status: DC | PRN
  Administered 2020-05-25 (×2): 10 mg via INTRAVENOUS
  Administered 2020-05-25: 20 mg via INTRAVENOUS
  Filled 2020-05-24 (×3): qty 4

## 2020-05-24 MED ORDER — ORAL CARE MOUTH RINSE
15.0000 mL | Freq: Two times a day (BID) | OROMUCOSAL | Status: DC
  Administered 2020-05-24 – 2020-06-11 (×37): 15 mL via OROMUCOSAL

## 2020-05-24 NOTE — Progress Notes (Signed)
Subjective: Patient reports Remains intubated  Objective: Vital signs in last 24 hours: Temp:  [97.9 F (36.6 C)-100.4 F (38 C)] 100.4 F (38 C) (10/31 0800) Pulse Rate:  [100-142] 140 (10/31 0808) Resp:  [18-28] 18 (10/31 0808) BP: (113-186)/(61-144) 158/110 (10/31 0700) SpO2:  [85 %-100 %] 99 % (10/31 0808) FiO2 (%):  [35 %] 35 % (10/31 0808) Weight:  [86.1 kg] 86.1 kg (10/31 0500)  Intake/Output from previous day: 10/30 0701 - 10/31 0700 In: 1229.9 [I.V.:192.2; NG/GT:720; IV Piggyback:317.7] Out: 2350 [Urine:2350] Intake/Output this shift: Total I/O In: 653.4 [I.V.:19.4; NG/GT:634] Out: -   Right side purposeful left hemiparesis incision clean dry and intact  Lab Results: No results for input(s): WBC, HGB, HCT, PLT in the last 72 hours. BMET No results for input(s): NA, K, CL, CO2, GLUCOSE, BUN, CREATININE, CALCIUM in the last 72 hours.  Studies/Results: DG Elbow Complete Left  Result Date: 05/22/2020 CLINICAL DATA:  LEFT ulnar fracture, ORIF EXAM: LEFT ELBOW - COMPLETE 3+ VIEW; DG C-ARM 1-60 MIN COMPARISON:  05/15/2020 FLUOROSCOPY TIME:  0 minutes 29 seconds Dose: 1.11 mGy Images: 6 FINDINGS: Images demonstrate reduction of previously identified proximal LEFT ulnar diaphyseal fracture. A dorsal plate and multiple screws have been placed across the fracture. No dislocation identified. Deformity of the LEFT radial head secondary to displaced fracture as previously noted. Humerus appears intact. Joint spaces preserved. IMPRESSION: Post ORIF of proximal LEFT ulnar diaphyseal fracture. Electronically Signed   By: Lavonia Dana M.D.   On: 05/22/2020 11:58   DG Forearm Left  Result Date: 05/22/2020 CLINICAL DATA:  Postop LEFT forearm fracture repair EXAM: LEFT FOREARM - 2 VIEW COMPARISON:  Portable exam 1357 hours compared to earlier intraoperative images of 05/22/2020 FINDINGS: Dorsal plate and multiple screws identified at reduced comminuted fracture of the proximal LEFT ulnar  diaphysis. No additional fracture or dislocation seen. Previously identified radial head fracture is not evident. IMPRESSION: Post ORIF of previously identified proximal LEFT ulnar diaphyseal fracture. Electronically Signed   By: Lavonia Dana M.D.   On: 05/22/2020 14:49   DG CHEST PORT 1 VIEW  Result Date: 05/22/2020 CLINICAL DATA:  Tracheostomy placement EXAM: PORTABLE CHEST 1 VIEW COMPARISON:  CT 05/15/2020, radiograph 05/16/2020 FINDINGS: Tracheostomy tube tip terminates in the trachea approximately 2.1 cm from the carina. Telemetry leads and external support devices overlie the chest. Increasing opacity within the right upper lobe with associated volume loss and atelectasis. Additional opacity seen silhouetting the superior right mediastinal margin albeit with some tortuosity of the brachiocephalic vascular which is present on comparison CT. Additional atelectatic changes elsewhere in the lungs. No visible residual pneumothorax or effusion. Cardiomediastinal contours are stable from prior counting for differences in technique. There is a crescentic air lucency subjacent to the right diaphragm compatible with free air. Sternal fracture seen on comparison CT imaging is poorly visualized. No other acute osseous abnormality. IMPRESSION: 1. Tracheostomy tube tip terminates in the trachea approximately 2.1 cm from the carina. 2. Increasing opacity within the right upper lobe with associated atelectatic volume loss. 3. Crescentic air lucency subjacent to the right diaphragm compatible with free air, correlate with abdominal symptoms or procedural intervention. These results will be called to the ordering clinician or representative by the Radiologist Assistant, and communication documented in the PACS or [employer]. Electronically Signed   By: Lovena Le M.D.   On: 05/22/2020 15:24   DG ABD ACUTE 2+V W 1V CHEST  Result Date: 05/23/2020 CLINICAL DATA:  Pneumoperitoneum EXAM: DG ABDOMEN ACUTE  WITH 1 VIEW  CHEST COMPARISON:  Chest radiograph 05/22/2020 FINDINGS: Tracheostomy device is present. Increased elevation of the right hemidiaphragm. Increased density at the right upper lobe may reflect upper lobe collapse. Atelectasis/consolidation at the left lung base again identified. Gastrostomy tube is present. Pneumoperitoneum is again identified adjacent to the stomach likely similar to the prior chest radiograph. Bowel gas pattern is otherwise unremarkable. IMPRESSION: Pneumoperitoneum again identified adjacent to the stomach likely similar to the prior chest radiograph. Increased elevation of the right hemidiaphragm. Increased density at the right upper lobe may reflect upper lobe collapse. Left lung base atelectasis/consolidation. Electronically Signed   By: Macy Mis M.D.   On: 05/23/2020 07:22   DG C-Arm 1-60 Min  Result Date: 05/22/2020 CLINICAL DATA:  LEFT ulnar fracture, ORIF EXAM: LEFT ELBOW - COMPLETE 3+ VIEW; DG C-ARM 1-60 MIN COMPARISON:  05/15/2020 FLUOROSCOPY TIME:  0 minutes 29 seconds Dose: 1.11 mGy Images: 6 FINDINGS: Images demonstrate reduction of previously identified proximal LEFT ulnar diaphyseal fracture. A dorsal plate and multiple screws have been placed across the fracture. No dislocation identified. Deformity of the LEFT radial head secondary to displaced fracture as previously noted. Humerus appears intact. Joint spaces preserved. IMPRESSION: Post ORIF of proximal LEFT ulnar diaphyseal fracture. Electronically Signed   By: Lavonia Dana M.D.   On: 05/22/2020 11:58    Assessment/Plan: No significant neuro change continue supportive care  LOS: 9 days     Elaina Hoops 05/24/2020, 9:10 AM

## 2020-05-24 NOTE — Progress Notes (Signed)
Inpatient Rehab Admissions Coordinator Note:   Per OT recommendation, pt was screened for CIR candidacy by Gayland Curry, MS, CCC-SLP.  At this time we are not recommending an Inpatient rehab consult.  Will continue to follow from a distance.  Please contact me with questions.    Gayland Curry, [place], [place] Admissions Coordinator [phone] 05/24/20 1:41 PM
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
## 2020-05-25 ENCOUNTER — Encounter (HOSPITAL_COMMUNITY): Payer: Self-pay | Admitting: Student

## 2020-05-25 LAB — GLUCOSE, CAPILLARY
Glucose-Capillary: 132 mg/dL — ABNORMAL HIGH (ref 70–99)
Glucose-Capillary: 133 mg/dL — ABNORMAL HIGH (ref 70–99)
Glucose-Capillary: 134 mg/dL — ABNORMAL HIGH (ref 70–99)
Glucose-Capillary: 139 mg/dL — ABNORMAL HIGH (ref 70–99)
Glucose-Capillary: 145 mg/dL — ABNORMAL HIGH (ref 70–99)
Glucose-Capillary: 154 mg/dL — ABNORMAL HIGH (ref 70–99)

## 2020-05-25 LAB — CBC
HCT: 33.7 % — ABNORMAL LOW (ref 36.0–46.0)
Hemoglobin: 10.6 g/dL — ABNORMAL LOW (ref 12.0–15.0)
MCH: 30.1 pg (ref 26.0–34.0)
MCHC: 31.5 g/dL (ref 30.0–36.0)
MCV: 95.7 fL (ref 80.0–100.0)
Platelets: 626 10*3/uL — ABNORMAL HIGH (ref 150–400)
RBC: 3.52 MIL/uL — ABNORMAL LOW (ref 3.87–5.11)
RDW: 13.1 % (ref 11.5–15.5)
WBC: 23.4 10*3/uL — ABNORMAL HIGH (ref 4.0–10.5)
nRBC: 0.1 % (ref 0.0–0.2)

## 2020-05-25 MED ORDER — FREE WATER
200.0000 mL | Status: DC
  Administered 2020-05-25 – 2020-06-11 (×102): 200 mL
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
## 2020-05-25 NOTE — Progress Notes (Signed)
Patient ID: Beth [name], female   DOB: 07/05/1989, 34 y.o.   MRN: [id number] I called her father, Kerry Dory, and updated him on Beth [name]'s progress.  Georganna Skeans, MD, MPH, FACS Please use [email or web address] to contact on call provider
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
## 2020-05-25 NOTE — Plan of Care (Signed)
Pt on trach collar, SpO2 100%. Able to cough and clear secretions.
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
## 2020-05-26 ENCOUNTER — Inpatient Hospital Stay (HOSPITAL_COMMUNITY): Payer: Medicaid Other

## 2020-05-26 LAB — COMPREHENSIVE METABOLIC PANEL
ALT: 174 U/L — ABNORMAL HIGH (ref 0–44)
AST: 101 U/L — ABNORMAL HIGH (ref 15–41)
Albumin: 2.8 g/dL — ABNORMAL LOW (ref 3.5–5.0)
Alkaline Phosphatase: 217 U/L — ABNORMAL HIGH (ref 38–126)
Anion gap: 11 (ref 5–15)
BUN: 18 mg/dL (ref 6–20)
CO2: 27 mmol/L (ref 22–32)
Calcium: 9.8 mg/dL (ref 8.9–10.3)
Chloride: 105 mmol/L (ref 98–111)
Creatinine, Ser: 0.49 mg/dL (ref 0.44–1.00)
GFR, Estimated: >60 mL/min (ref >60)
Glucose, Bld: 146 mg/dL — ABNORMAL HIGH (ref 70–99)
Potassium: 4.4 mmol/L (ref 3.5–5.1)
Sodium: 143 mmol/L (ref 135–145)
Total Bilirubin: 0.6 mg/dL (ref 0.3–1.2)
Total Protein: 7.5 g/dL (ref 6.5–8.1)

## 2020-05-26 LAB — GLUCOSE, CAPILLARY
Glucose-Capillary: 119 mg/dL — ABNORMAL HIGH (ref 70–99)
Glucose-Capillary: 122 mg/dL — ABNORMAL HIGH (ref 70–99)
Glucose-Capillary: 142 mg/dL — ABNORMAL HIGH (ref 70–99)
Glucose-Capillary: 145 mg/dL — ABNORMAL HIGH (ref 70–99)
Glucose-Capillary: 146 mg/dL — ABNORMAL HIGH (ref 70–99)
Glucose-Capillary: 155 mg/dL — ABNORMAL HIGH (ref 70–99)

## 2020-05-26 LAB — CBC
HCT: 34 % — ABNORMAL LOW (ref 36.0–46.0)
Hemoglobin: 10.8 g/dL — ABNORMAL LOW (ref 12.0–15.0)
MCH: 30.6 pg (ref 26.0–34.0)
MCHC: 31.8 g/dL (ref 30.0–36.0)
MCV: 96.3 fL (ref 80.0–100.0)
Platelets: 651 10*3/uL — ABNORMAL HIGH (ref 150–400)
RBC: 3.53 MIL/uL — ABNORMAL LOW (ref 3.87–5.11)
RDW: 13.2 % (ref 11.5–15.5)
WBC: 24.3 10*3/uL — ABNORMAL HIGH (ref 4.0–10.5)
nRBC: 0.1 % (ref 0.0–0.2)

## 2020-05-26 MED ORDER — SODIUM CHLORIDE 0.9 % IV SOLN
2.0000 g | Freq: Three times a day (TID) | INTRAVENOUS | Status: DC
  Administered 2020-05-26 – 2020-05-31 (×16): 2 g via INTRAVENOUS
  Filled 2020-05-26 (×16): qty 2

## 2020-05-26 MED ORDER — METOPROLOL TARTRATE 25 MG/10 ML ORAL SUSPENSION
25.0000 mg | Freq: Two times a day (BID) | ORAL | Status: DC
  Administered 2020-05-26 (×2): 25 mg
  Filled 2020-05-26 (×2): qty 10

## 2020-05-26 MED ORDER — WHITE PETROLATUM EX OINT
TOPICAL_OINTMENT | CUTANEOUS | Status: AC
  Administered 2020-05-26: 1
  Filled 2020-05-26: qty 28.35
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
## 2020-05-27 ENCOUNTER — Other Ambulatory Visit: Payer: Self-pay

## 2020-05-27 LAB — BASIC METABOLIC PANEL
Anion gap: 9 (ref 5–15)
BUN: 24 mg/dL — ABNORMAL HIGH (ref 6–20)
CO2: 26 mmol/L (ref 22–32)
Calcium: 9.4 mg/dL (ref 8.9–10.3)
Chloride: 106 mmol/L (ref 98–111)
Creatinine, Ser: 0.45 mg/dL (ref 0.44–1.00)
GFR, Estimated: >60 mL/min (ref >60)
Glucose, Bld: 128 mg/dL — ABNORMAL HIGH (ref 70–99)
Potassium: 4.6 mmol/L (ref 3.5–5.1)
Sodium: 141 mmol/L (ref 135–145)

## 2020-05-27 LAB — GLUCOSE, CAPILLARY
Glucose-Capillary: 118 mg/dL — ABNORMAL HIGH (ref 70–99)
Glucose-Capillary: 109 mg/dL — ABNORMAL HIGH (ref 70–99)
Glucose-Capillary: 142 mg/dL — ABNORMAL HIGH (ref 70–99)
Glucose-Capillary: 111 mg/dL — ABNORMAL HIGH (ref 70–99)
Glucose-Capillary: 117 mg/dL — ABNORMAL HIGH (ref 70–99)
Glucose-Capillary: 129 mg/dL — ABNORMAL HIGH (ref 70–99)

## 2020-05-27 LAB — CBC
HCT: 33.2 % — ABNORMAL LOW (ref 36.0–46.0)
Hemoglobin: 10.6 g/dL — ABNORMAL LOW (ref 12.0–15.0)
MCH: 31.1 pg (ref 26.0–34.0)
MCHC: 31.9 g/dL (ref 30.0–36.0)
MCV: 97.4 fL (ref 80.0–100.0)
Platelets: 601 10*3/uL — ABNORMAL HIGH (ref 150–400)
RBC: 3.41 MIL/uL — ABNORMAL LOW (ref 3.87–5.11)
RDW: 13.2 % (ref 11.5–15.5)
WBC: 22.3 10*3/uL — ABNORMAL HIGH (ref 4.0–10.5)
nRBC: 0.1 % (ref 0.0–0.2)

## 2020-05-27 MED ORDER — METOPROLOL TARTRATE 25 MG/10 ML ORAL SUSPENSION
50.0000 mg | Freq: Two times a day (BID) | ORAL | Status: DC
  Administered 2020-05-27 – 2020-06-02 (×13): 50 mg
  Filled 2020-05-27 (×13): qty 20
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
## 2020-05-28 DIAGNOSIS — S069X4S Unspecified intracranial injury with loss of consciousness of 6 hours to 24 hours, sequela: Secondary | ICD-10-CM | POA: Diagnosis not present

## 2020-05-28 DIAGNOSIS — T07XXXA Unspecified multiple injuries, initial encounter: Secondary | ICD-10-CM | POA: Diagnosis not present

## 2020-05-28 LAB — GLUCOSE, CAPILLARY
Glucose-Capillary: 116 mg/dL — ABNORMAL HIGH (ref 70–99)
Glucose-Capillary: 123 mg/dL — ABNORMAL HIGH (ref 70–99)
Glucose-Capillary: 133 mg/dL — ABNORMAL HIGH (ref 70–99)
Glucose-Capillary: 109 mg/dL — ABNORMAL HIGH (ref 70–99)
Glucose-Capillary: 115 mg/dL — ABNORMAL HIGH (ref 70–99)
Glucose-Capillary: 136 mg/dL — ABNORMAL HIGH (ref 70–99)

## 2020-05-28 LAB — CBC
HCT: 32.9 % — ABNORMAL LOW (ref 36.0–46.0)
Hemoglobin: 10.6 g/dL — ABNORMAL LOW (ref 12.0–15.0)
MCH: 30.8 pg (ref 26.0–34.0)
MCHC: 32.2 g/dL (ref 30.0–36.0)
MCV: 95.6 fL (ref 80.0–100.0)
Platelets: 455 10*3/uL — ABNORMAL HIGH (ref 150–400)
RBC: 3.44 MIL/uL — ABNORMAL LOW (ref 3.87–5.11)
RDW: 13 % (ref 11.5–15.5)
WBC: 17.6 10*3/uL — ABNORMAL HIGH (ref 4.0–10.5)
nRBC: 0 % (ref 0.0–0.2)

## 2020-05-28 LAB — BASIC METABOLIC PANEL
Anion gap: 10 (ref 5–15)
BUN: 19 mg/dL (ref 6–20)
CO2: 24 mmol/L (ref 22–32)
Calcium: 9.3 mg/dL (ref 8.9–10.3)
Chloride: 106 mmol/L (ref 98–111)
Creatinine, Ser: 0.41 mg/dL — ABNORMAL LOW (ref 0.44–1.00)
GFR, Estimated: >60 mL/min (ref >60)
Glucose, Bld: 120 mg/dL — ABNORMAL HIGH (ref 70–99)
Potassium: 4.6 mmol/L (ref 3.5–5.1)
Sodium: 140 mmol/L (ref 135–145)

## 2020-05-28 NOTE — Progress Notes (Signed)
Inpatient Rehabilitation-Admissions Coordinator   Consult order received. Please see PM&R consult note by Dr. Naaman Plummer from 11/4 for details. Attempted to reach out to pt's mother; no response at this time. Left CIR brochures in the patient's room with my contact information for family to call me. Will continue to attempt to make contact with pt's family to discuss recommended rehab program.   Raechel Ache, OTR/L  Rehab Admissions Coordinator  [phone] 05/28/2020 4:03 PM

## 2020-05-28 NOTE — Progress Notes (Signed)
Patient ID: Beth [name], female   DOB: 07/05/1989, 34 y.o.   MRN: [id number] BP 119/69   Pulse (!) 107   Temp 99.6 F (37.6 C)   Resp 20   Ht 5\' 6"  (1.676 m)   Wt 86.1 kg   SpO2 96%   BMI 30.64 kg/m  Alert, not following commands Purposeful movements in all extremities Continuing with slow improvement No new recommendations

## 2020-05-28 NOTE — Progress Notes (Signed)
Inpatient Rehab Admissions Coordinator:   At this time we are recommending a CIR consult and I will place an order per our protocol.   Shann Medal, PT, DPT Admissions Coordinator [phone] 05/28/20  9:08 AM
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
## 2020-05-28 NOTE — Progress Notes (Signed)
Subjective: Patient resting in bed.  Responsive to stimulus but not following commands.   Objective: Vital signs in last 24 hours: Temp:  [98.5 F (36.9 C)-99.6 F (37.6 C)] 99.6 F (37.6 C) (11/04 1200) Pulse Rate:  [98-132] 112 (11/04 1600) Resp:  [16-28] 17 (11/04 1512) BP: (97-140)/(55-106) 99/72 (11/04 1512) SpO2:  [95 %-100 %] 100 % (11/04 1600) FiO2 (%):  [21 %] 21 % (11/04 1512)  Intake/Output from previous day: 11/03 0701 - 11/04 0700 In: 1514.2 [I.V.:154.2; NG/GT:1060; IV Piggyback:300] Out: 2000 [Urine:2000] Intake/Output this shift: Total I/O In: 100.1 [IV Piggyback:100.1] Out: 100 [Urine:100]  Recent Labs    05/26/20 0425 05/27/20 0232 05/28/20 0358  HGB 10.8* 10.6* 10.6*   Recent Labs    05/27/20 0232 05/28/20 0358  WBC 22.3* 17.6*  RBC 3.41* 3.44*  HCT 33.2* 32.9*  PLT 601* 455*   Recent Labs    05/27/20 0232 05/28/20 0358  NA 141 140  K 4.6 4.6  CL 106 106  CO2 26 24  BUN 24* 19  CREATININE 0.45 0.41*  GLUCOSE 128* 120*  CALCIUM 9.4 9.3   No results for input(s): LABPT, INR in the last 72 hours.  Right hand: brisk capillary refill all digits.  Wound without erythema or drainage.  Maceration present but improved.  Small finger crosses over ring finger by nail width.  This is greater than on opposite side.    Assessment/Plan: Right small finger proximal phalanx fracture.  May need fixation at some point due to rotation.  Currently patient is too active with right hand but not following commands or able to maintain rest of hand.  Concern that pin fixation would break down or pins would be driven in.  Not enough bone for internal fixation of fracture.  Recommend continue splint at this time.  As patient becomes more awake/aware and can manage hand better can consider fixation vs late treatment with derotational osteotomy if the position is problematic.     Leanora Cover 05/28/2020, 4:15 PM

## 2020-05-29 LAB — CULTURE, RESPIRATORY W GRAM STAIN: Culture: NORMAL

## 2020-05-29 LAB — BASIC METABOLIC PANEL
Anion gap: 9 (ref 5–15)
BUN: 18 mg/dL (ref 6–20)
CO2: 24 mmol/L (ref 22–32)
Calcium: 9.7 mg/dL (ref 8.9–10.3)
Chloride: 106 mmol/L (ref 98–111)
Creatinine, Ser: 0.42 mg/dL — ABNORMAL LOW (ref 0.44–1.00)
GFR, Estimated: >60 mL/min (ref >60)
Glucose, Bld: 152 mg/dL — ABNORMAL HIGH (ref 70–99)
Potassium: 4 mmol/L (ref 3.5–5.1)
Sodium: 139 mmol/L (ref 135–145)

## 2020-05-29 LAB — CBC
HCT: 33.2 % — ABNORMAL LOW (ref 36.0–46.0)
Hemoglobin: 10.6 g/dL — ABNORMAL LOW (ref 12.0–15.0)
MCH: 29.9 pg (ref 26.0–34.0)
MCHC: 31.9 g/dL (ref 30.0–36.0)
MCV: 93.8 fL (ref 80.0–100.0)
Platelets: 784 10*3/uL — ABNORMAL HIGH (ref 150–400)
RBC: 3.54 MIL/uL — ABNORMAL LOW (ref 3.87–5.11)
RDW: 12.8 % (ref 11.5–15.5)
WBC: 21.1 10*3/uL — ABNORMAL HIGH (ref 4.0–10.5)
nRBC: 0.1 % (ref 0.0–0.2)

## 2020-05-29 LAB — GLUCOSE, CAPILLARY
Glucose-Capillary: 126 mg/dL — ABNORMAL HIGH (ref 70–99)
Glucose-Capillary: 126 mg/dL — ABNORMAL HIGH (ref 70–99)
Glucose-Capillary: 114 mg/dL — ABNORMAL HIGH (ref 70–99)
Glucose-Capillary: 125 mg/dL — ABNORMAL HIGH (ref 70–99)
Glucose-Capillary: 127 mg/dL — ABNORMAL HIGH (ref 70–99)
Glucose-Capillary: 114 mg/dL — ABNORMAL HIGH (ref 70–99)

## 2020-05-29 NOTE — Progress Notes (Signed)
Inpatient Rehabilitation-Admissions Coordinator   Spoke with pt's father and discussed the recommended CIR program. Pt's father prefers this program but states her mother will need to weigh in and be a part of the discussion as well. I have yet to reach the mother via phone at this time. Pt's father plans to be at [facility] this coming Monday for further follow up. Will continue attempts to reach out to the patient's mother for CIR discussion. I contacted financial counseling for assist with this case, per her father's request.   Raechel Ache, OTR/L  Rehab Admissions Coordinator  [phone] 05/29/2020 1:43 PM

## 2020-05-29 NOTE — Progress Notes (Signed)
RTx2 removed all 4 trach sutures per MD order. No complications. New trach ties placed and trach is secure. RT will continue to monitor.
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
## 2020-05-30 LAB — GLUCOSE, CAPILLARY
Glucose-Capillary: 109 mg/dL — ABNORMAL HIGH (ref 70–99)
Glucose-Capillary: 109 mg/dL — ABNORMAL HIGH (ref 70–99)
Glucose-Capillary: 109 mg/dL — ABNORMAL HIGH (ref 70–99)
Glucose-Capillary: 120 mg/dL — ABNORMAL HIGH (ref 70–99)
Glucose-Capillary: 115 mg/dL — ABNORMAL HIGH (ref 70–99)
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
## 2020-05-31 LAB — GLUCOSE, CAPILLARY
Glucose-Capillary: 118 mg/dL — ABNORMAL HIGH (ref 70–99)
Glucose-Capillary: 121 mg/dL — ABNORMAL HIGH (ref 70–99)
Glucose-Capillary: 122 mg/dL — ABNORMAL HIGH (ref 70–99)
Glucose-Capillary: 127 mg/dL — ABNORMAL HIGH (ref 70–99)
Glucose-Capillary: 63 mg/dL — ABNORMAL LOW (ref 70–99)
Glucose-Capillary: 88 mg/dL (ref 70–99)
Glucose-Capillary: 92 mg/dL (ref 70–99)
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
## 2020-06-01 LAB — CBC WITH DIFFERENTIAL/PLATELET
Abs Immature Granulocytes: 0.24 10*3/uL — ABNORMAL HIGH (ref 0.00–0.07)
Basophils Absolute: 0.1 10*3/uL (ref 0.0–0.1)
Basophils Relative: 1 %
Eosinophils Absolute: 0.4 10*3/uL (ref 0.0–0.5)
Eosinophils Relative: 3 %
HCT: 34.2 % — ABNORMAL LOW (ref 36.0–46.0)
Hemoglobin: 11.2 g/dL — ABNORMAL LOW (ref 12.0–15.0)
Immature Granulocytes: 2 %
Lymphocytes Relative: 14 %
Lymphs Abs: 2 10*3/uL (ref 0.7–4.0)
MCH: 30.5 pg (ref 26.0–34.0)
MCHC: 32.7 g/dL (ref 30.0–36.0)
MCV: 93.2 fL (ref 80.0–100.0)
Monocytes Absolute: 0.9 10*3/uL (ref 0.1–1.0)
Monocytes Relative: 6 %
Neutro Abs: 10.5 10*3/uL — ABNORMAL HIGH (ref 1.7–7.7)
Neutrophils Relative %: 74 %
Platelets: 988 10*3/uL (ref 150–400)
RBC: 3.67 MIL/uL — ABNORMAL LOW (ref 3.87–5.11)
RDW: 12.6 % (ref 11.5–15.5)
WBC: 14 10*3/uL — ABNORMAL HIGH (ref 4.0–10.5)
nRBC: 0.1 % (ref 0.0–0.2)

## 2020-06-01 LAB — GLUCOSE, CAPILLARY
Glucose-Capillary: 102 mg/dL — ABNORMAL HIGH (ref 70–99)
Glucose-Capillary: 103 mg/dL — ABNORMAL HIGH (ref 70–99)
Glucose-Capillary: 117 mg/dL — ABNORMAL HIGH (ref 70–99)
Glucose-Capillary: 126 mg/dL — ABNORMAL HIGH (ref 70–99)
Glucose-Capillary: 129 mg/dL — ABNORMAL HIGH (ref 70–99)
Glucose-Capillary: 92 mg/dL (ref 70–99)
Glucose-Capillary: 93 mg/dL (ref 70–99)

## 2020-06-01 LAB — BASIC METABOLIC PANEL
Anion gap: 10 (ref 5–15)
BUN: 16 mg/dL (ref 6–20)
CO2: 24 mmol/L (ref 22–32)
Calcium: 10.1 mg/dL (ref 8.9–10.3)
Chloride: 104 mmol/L (ref 98–111)
Creatinine, Ser: 0.46 mg/dL (ref 0.44–1.00)
GFR, Estimated: >60 mL/min (ref >60)
Glucose, Bld: 145 mg/dL — ABNORMAL HIGH (ref 70–99)
Potassium: 4.1 mmol/L (ref 3.5–5.1)
Sodium: 138 mmol/L (ref 135–145)

## 2020-06-01 LAB — CBC
HCT: 36.2 % (ref 36.0–46.0)
Hemoglobin: 11.5 g/dL — ABNORMAL LOW (ref 12.0–15.0)
MCH: 29.6 pg (ref 26.0–34.0)
MCHC: 31.8 g/dL (ref 30.0–36.0)
MCV: 93.1 fL (ref 80.0–100.0)
Platelets: 1002 10*3/uL (ref 150–400)
RBC: 3.89 MIL/uL (ref 3.87–5.11)
RDW: 12.5 % (ref 11.5–15.5)
WBC: 15.2 10*3/uL — ABNORMAL HIGH (ref 4.0–10.5)
nRBC: 0 % (ref 0.0–0.2)

## 2020-06-01 MED ORDER — VITAMIN D 25 MCG (1000 UNIT) PO TABS
2000.0000 [IU] | ORAL_TABLET | Freq: Every day | ORAL | Status: DC
  Administered 2020-06-01 – 2020-06-10 (×10): 2000 [IU]
  Filled 2020-06-01 (×11): qty 2

## 2020-06-01 MED ORDER — OSMOLITE 1.5 CAL PO LIQD
1000.0000 mL | ORAL | Status: DC
  Administered 2020-06-01 – 2020-06-10 (×11): 1000 mL
  Filled 2020-06-01 (×6): qty 1000

## 2020-06-01 MED ORDER — BETHANECHOL CHLORIDE 10 MG PO TABS
10.0000 mg | ORAL_TABLET | Freq: Three times a day (TID) | ORAL | Status: DC
  Administered 2020-06-01 – 2020-06-03 (×9): 10 mg
  Filled 2020-06-01 (×9): qty 1

## 2020-06-01 MED ORDER — MORPHINE SULFATE (PF) 2 MG/ML IV SOLN
2.0000 mg | INTRAVENOUS | Status: DC | PRN
  Administered 2020-06-03 – 2020-06-11 (×5): 2 mg via INTRAVENOUS
  Filled 2020-06-01 (×5): qty 1

## 2020-06-01 MED ORDER — PROSOURCE TF PO LIQD
45.0000 mL | Freq: Three times a day (TID) | ORAL | Status: DC
  Administered 2020-06-01 – 2020-06-11 (×30): 45 mL
  Filled 2020-06-01 (×30): qty 45
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
## 2020-06-01 NOTE — Procedures (Signed)
Tracheostomy Change Note  Patient Details:   Name: Rachel Rison DOB: 07/05/1989 MRN: [id number]    Airway Documentation:     Evaluation  O2 sats: stable throughout Complications: No apparent complications Patient did tolerate procedure well. Bilateral Breath Sounds: Clear, Diminished    RT called to bedside due to patient de cannulating herself. A #6 cuffless Shiley was place without difficulty. Positive color change on CO2 detector. Lurline Idol was secured with trach ties.    Donzell Coller, [facility] 06/01/2020, 2:25 PM
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
## 2020-06-01 NOTE — Progress Notes (Signed)
Inpatient Rehabilitation-Admissions Coordinator   Met with the pt's mother, the patient's fiance, and her step father for CIR discussion. They would like to pursue the program here at [facility]. They indicated she has insurance and plans to bring the card tomorrow. We reviewed program details, expected LOS, and anticipated outcomes. They verified that she will have 24/7 Assist at DC. Discussed I will follow her progress with therapies on acute until she reaches a level appropriate for CIR. They are all in agreement with current plan. AC will continue to follow.   Raechel Ache, OTR/L  Rehab Admissions Coordinator  [phone] 06/01/2020 4:35 PM
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
## 2020-06-01 NOTE — Progress Notes (Signed)
Inpatient Rehabilitation-Admissions Coordinator   Per last PT note, pt is currently demonstrating behaviors consistent with a RLAS II. Will continue to follow for progression. Pt most appropriate for this CIR placement once behaviors consistent with a RLAS IV or higher.   Hopeful to meet with pt's mother today. Still have been unable to reach her via phone. Please contact this AC if pt's mother arrives at the hospital today.   Raechel Ache, OTR/L  Rehab Admissions Coordinator  [phone] 06/01/2020 12:14 PM
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
## 2020-06-02 LAB — GLUCOSE, CAPILLARY
Glucose-Capillary: 105 mg/dL — ABNORMAL HIGH (ref 70–99)
Glucose-Capillary: 119 mg/dL — ABNORMAL HIGH (ref 70–99)
Glucose-Capillary: 121 mg/dL — ABNORMAL HIGH (ref 70–99)
Glucose-Capillary: 124 mg/dL — ABNORMAL HIGH (ref 70–99)
Glucose-Capillary: 135 mg/dL — ABNORMAL HIGH (ref 70–99)
Glucose-Capillary: 99 mg/dL (ref 70–99)

## 2020-06-02 LAB — PATHOLOGIST SMEAR REVIEW

## 2020-06-02 MED ORDER — DOCUSATE SODIUM 50 MG/5ML PO LIQD: 50.0000 mg | Freq: Two times a day (BID) | ORAL | Status: DC | PRN

## 2020-06-02 MED ORDER — OXYCODONE HCL 5 MG/5ML PO SOLN
5.0000 mg | ORAL | Status: DC | PRN
  Administered 2020-06-03 – 2020-06-11 (×15): 5 mg
  Filled 2020-06-02 (×15): qty 5

## 2020-06-02 MED ORDER — METOPROLOL TARTRATE 25 MG/10 ML ORAL SUSPENSION
100.0000 mg | Freq: Two times a day (BID) | ORAL | Status: DC
  Administered 2020-06-03 – 2020-06-06 (×8): 100 mg
  Filled 2020-06-02 (×9): qty 40

## 2020-06-02 MED ORDER — POLYETHYLENE GLYCOL 3350 17 G PO PACK: 17.0000 g | PACK | Freq: Every day | ORAL | Status: DC | PRN

## 2020-06-02 MED ORDER — METHOCARBAMOL 500 MG PO TABS
500.0000 mg | ORAL_TABLET | Freq: Three times a day (TID) | ORAL | Status: DC
  Administered 2020-06-02 – 2020-06-03 (×3): 500 mg
  Filled 2020-06-02 (×3): qty 1

## 2020-06-02 MED ORDER — DOCUSATE SODIUM 50 MG/5ML PO LIQD: 50.0000 mg | Freq: Two times a day (BID) | ORAL | Status: DC
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
## 2020-06-02 NOTE — Progress Notes (Signed)
Patient ID: Beth [name], female   DOB: November 14, 1985, 34 y.o.   MRN: [id number] Alert, not following commands Shaking head Will move extremities not to command Slight improvement neurologically Investigating placement possiblities
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
## 2020-06-03 ENCOUNTER — Inpatient Hospital Stay (HOSPITAL_COMMUNITY): Payer: Medicaid Other

## 2020-06-03 DIAGNOSIS — R509 Fever, unspecified: Secondary | ICD-10-CM | POA: Diagnosis not present

## 2020-06-03 LAB — CBC
HCT: 37 % (ref 36.0–46.0)
Hemoglobin: 11.8 g/dL — ABNORMAL LOW (ref 12.0–15.0)
MCH: 30.4 pg (ref 26.0–34.0)
MCHC: 31.9 g/dL (ref 30.0–36.0)
MCV: 95.4 fL (ref 80.0–100.0)
Platelets: 925 10*3/uL (ref 150–400)
RBC: 3.88 MIL/uL (ref 3.87–5.11)
RDW: 13 % (ref 11.5–15.5)
WBC: 15.1 10*3/uL — ABNORMAL HIGH (ref 4.0–10.5)
nRBC: 0 % (ref 0.0–0.2)

## 2020-06-03 LAB — BASIC METABOLIC PANEL
Anion gap: 10 (ref 5–15)
BUN: 18 mg/dL (ref 6–20)
CO2: 25 mmol/L (ref 22–32)
Calcium: 10 mg/dL (ref 8.9–10.3)
Chloride: 103 mmol/L (ref 98–111)
Creatinine, Ser: 0.51 mg/dL (ref 0.44–1.00)
GFR, Estimated: >60 mL/min (ref >60)
Glucose, Bld: 125 mg/dL — ABNORMAL HIGH (ref 70–99)
Potassium: 4.3 mmol/L (ref 3.5–5.1)
Sodium: 138 mmol/L (ref 135–145)

## 2020-06-03 LAB — URINALYSIS, ROUTINE W REFLEX MICROSCOPIC
Bilirubin Urine: NEGATIVE
Glucose, UA: NEGATIVE mg/dL
Hgb urine dipstick: NEGATIVE
Ketones, ur: NEGATIVE mg/dL
Leukocytes,Ua: NEGATIVE
Nitrite: NEGATIVE
Protein, ur: NEGATIVE mg/dL
Specific Gravity, Urine: 1.018 (ref 1.005–1.030)
pH: 6 (ref 5.0–8.0)

## 2020-06-03 LAB — GLUCOSE, CAPILLARY
Glucose-Capillary: 107 mg/dL — ABNORMAL HIGH (ref 70–99)
Glucose-Capillary: 103 mg/dL — ABNORMAL HIGH (ref 70–99)
Glucose-Capillary: 83 mg/dL (ref 70–99)
Glucose-Capillary: 113 mg/dL — ABNORMAL HIGH (ref 70–99)
Glucose-Capillary: 80 mg/dL (ref 70–99)

## 2020-06-03 MED ORDER — HEPARIN (PORCINE) 25000 UT/250ML-% IV SOLN
1450.0000 [IU]/h | INTRAVENOUS | Status: DC
  Administered 2020-06-03: 1250 [IU]/h via INTRAVENOUS
  Filled 2020-06-03 (×2): qty 250

## 2020-06-03 MED ORDER — METHOCARBAMOL 500 MG PO TABS
500.0000 mg | ORAL_TABLET | Freq: Three times a day (TID) | ORAL | Status: DC | PRN
  Administered 2020-06-03 – 2020-06-06 (×7): 500 mg
  Filled 2020-06-03 (×7): qty 1

## 2020-06-03 MED ORDER — IOHEXOL 350 MG/ML SOLN
75.0000 mL | Freq: Once | INTRAVENOUS | Status: AC | PRN
  Administered 2020-06-03: 75 mL via INTRAVENOUS

## 2020-06-03 MED ORDER — HEPARIN BOLUS VIA INFUSION
2100.0000 [IU] | Freq: Once | INTRAVENOUS | Status: AC
  Administered 2020-06-03: 2100 [IU] via INTRAVENOUS
  Filled 2020-06-03: qty 2100

## 2020-06-03 MED ORDER — SODIUM CHLORIDE 0.9 % IV BOLUS
500.0000 mL | Freq: Once | INTRAVENOUS | Status: AC
  Administered 2020-06-03: 500 mL via INTRAVENOUS

## 2020-06-03 MED ORDER — LACTATED RINGERS IV SOLN: INTRAVENOUS | Status: DC
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
## 2020-06-03 NOTE — Significant Event (Addendum)
Rapid Response Event Note   Reason for Call :  MEWS: T 99.4, BP 122/89, HR 124, RR 26-30, SpO2 99% on 5L/21% TC CBG 103  Initial Focused Assessment:  Pt awake, lying in bed. Per RN she appears more lethargic. Lung sounds are clear. Abdomen is soft, bowel sounds are active. Skin is warm, dry. Color is pink. Pt currently not on IV fluids, she is receiving 252mL of free water q4h. Skin non-tenting. Per RN, pt has been having good urine output.  Interventions:  -500 cc bolus  Plan of Care:  -Following fluid administration, if HR, RR, and fever persist consider checking lactic acid level -HOB 30 degrees or greater  Call rapid response for additional needs.  Event Summary:  MD Notified: Sandria Senter, PA Call Time: [phone] Arrival Time: [phone] End Time: [place], RN

## 2020-06-03 NOTE — Progress Notes (Signed)
Bilateral lower extremity venous duplex completed. Refer to "CV Proc" under chart review to view preliminary results.  Preliminary results discussed with Joelene Millin, RN.  06/03/2020 3:16 PM Kelby Aline., MHA, RVT, RDCS, RDMS
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
## 2020-06-03 NOTE — Progress Notes (Signed)
Patient is improving form morning assessment. Temp was noted and Claiborne Billings PA was notified. Orders taken and implemented. Patient resting in bed no distress noted. Will continue to monitor.

## 2020-06-03 NOTE — Progress Notes (Signed)
Inpatient Rehabilitation-Admissions Coordinator   Observed some of PT/OT/SLP session today. Feel pt is functionally appropriate for CIR at this time. Pt's fiance to bring insurance card to the hospital today. AC following for medical readiness.   Raechel Ache, OTR/L  Rehab Admissions Coordinator  [phone] 06/03/2020 1:53 PM
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
## 2020-06-04 LAB — BASIC METABOLIC PANEL
Anion gap: 10 (ref 5–15)
BUN: 15 mg/dL (ref 6–20)
CO2: 23 mmol/L (ref 22–32)
Calcium: 9.7 mg/dL (ref 8.9–10.3)
Chloride: 105 mmol/L (ref 98–111)
Creatinine, Ser: 0.49 mg/dL (ref 0.44–1.00)
GFR, Estimated: >60 mL/min (ref >60)
Glucose, Bld: 121 mg/dL — ABNORMAL HIGH (ref 70–99)
Potassium: 3.9 mmol/L (ref 3.5–5.1)
Sodium: 138 mmol/L (ref 135–145)

## 2020-06-04 LAB — CBC
HCT: 34.3 % — ABNORMAL LOW (ref 36.0–46.0)
Hemoglobin: 10.7 g/dL — ABNORMAL LOW (ref 12.0–15.0)
MCH: 30 pg (ref 26.0–34.0)
MCHC: 31.2 g/dL (ref 30.0–36.0)
MCV: 96.1 fL (ref 80.0–100.0)
Platelets: 865 10*3/uL — ABNORMAL HIGH (ref 150–400)
RBC: 3.57 MIL/uL — ABNORMAL LOW (ref 3.87–5.11)
RDW: 13 % (ref 11.5–15.5)
WBC: 13.6 10*3/uL — ABNORMAL HIGH (ref 4.0–10.5)
nRBC: 0 % (ref 0.0–0.2)

## 2020-06-04 LAB — GLUCOSE, CAPILLARY
Glucose-Capillary: 104 mg/dL — ABNORMAL HIGH (ref 70–99)
Glucose-Capillary: 108 mg/dL — ABNORMAL HIGH (ref 70–99)
Glucose-Capillary: 109 mg/dL — ABNORMAL HIGH (ref 70–99)
Glucose-Capillary: 111 mg/dL — ABNORMAL HIGH (ref 70–99)
Glucose-Capillary: 114 mg/dL — ABNORMAL HIGH (ref 70–99)
Glucose-Capillary: 115 mg/dL — ABNORMAL HIGH (ref 70–99)
Glucose-Capillary: 95 mg/dL (ref 70–99)

## 2020-06-04 LAB — HEPARIN LEVEL (UNFRACTIONATED)
Heparin Unfractionated: 0.39 [IU]/mL (ref 0.30–0.70)
Heparin Unfractionated: 0.14 [IU]/mL — ABNORMAL LOW (ref 0.30–0.70)

## 2020-06-04 LAB — URINE CULTURE: Culture: NO GROWTH

## 2020-06-04 MED ORDER — BETHANECHOL CHLORIDE 10 MG PO TABS
5.0000 mg | ORAL_TABLET | Freq: Three times a day (TID) | ORAL | Status: DC
  Administered 2020-06-04 – 2020-06-06 (×9): 5 mg
  Filled 2020-06-04 (×9): qty 1

## 2020-06-04 MED ORDER — POLYETHYLENE GLYCOL 3350 17 G PO PACK: 17.0000 g | PACK | Freq: Every day | ORAL | Status: DC | PRN

## 2020-06-04 MED ORDER — APIXABAN 5 MG PO TABS
10.0000 mg | ORAL_TABLET | Freq: Two times a day (BID) | ORAL | Status: AC
  Administered 2020-06-04 – 2020-06-10 (×14): 10 mg
  Filled 2020-06-04 (×14): qty 2

## 2020-06-04 MED ORDER — APIXABAN 5 MG PO TABS
5.0000 mg | ORAL_TABLET | Freq: Two times a day (BID) | ORAL | Status: DC
  Filled 2020-06-04: qty 1
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
## 2020-06-04 NOTE — TOC Progression Note (Signed)
Transition of Care [facility]) - Progression Note    Patient Details  Name: Beth [name] MRN: [id number] Date of Birth: 05/23/1986  [facility] CM/SW Contact  Ella Bodo, RN Phone Number: 06/04/2020, 4:41 PM  Clinical Narrative: Rehab consult in progress; admissions coordinator checking insurance benefits.  Family able to provide 24-hour care at discharge from rehab.  We will continue to follow progress, medical readiness for CIR.    Expected Discharge Plan: IP Rehab Facility Barriers to Discharge: Continued Medical Work up  Expected Discharge Plan and Services Expected Discharge Plan: [place]                                               Social Determinants of Health (SDOH) Interventions    Readmission Risk Interventions No flowsheet data found.  Reinaldo Raddle, RN, BSN  Trauma/Neuro ICU Case Manager [phone]

## 2020-06-04 NOTE — Discharge Instructions (Signed)
Information on my medicine - ELIQUIS (apixaban)  This medication education was reviewed with me or my healthcare representative as part of my discharge preparation.  The pharmacist that spoke with me during my hospital stay was:  Lavenia Atlas, [facility]  Why was Eliquis prescribed for you? Eliquis was prescribed to treat blood clots that may have been found in the veins of your legs (deep vein thrombosis) or in your lungs (pulmonary embolism) and to reduce the risk of them occurring again.  What do You need to know about Eliquis ? The starting dose is 10 mg (two 5 mg tablets) taken TWICE daily for the FIRST SEVEN (7) DAYS, then on (enter date)  06/11/20  the dose is reduced to ONE 5 mg tablet taken TWICE daily.  Eliquis may be taken with or without food.   Try to take the dose about the same time in the morning and in the evening. If you have difficulty swallowing the tablet whole please discuss with your pharmacist how to take the medication safely.  Take Eliquis exactly as prescribed and DO NOT stop taking Eliquis without talking to the doctor who prescribed the medication.  Stopping may increase your risk of developing a new blood clot.  Refill your prescription before you run out.  After discharge, you should have regular check-up appointments with your healthcare provider that is prescribing your Eliquis.    What do you do if you miss a dose? If a dose of ELIQUIS is not taken at the scheduled time, take it as soon as possible on the same day and twice-daily administration should be resumed. The dose should not be doubled to make up for a missed dose.  Important Safety Information A possible side effect of Eliquis is bleeding. You should call your healthcare provider right away if you experience any of the following: ? Bleeding from an injury or your nose that does not stop. ? Unusual colored urine (red or dark brown) or unusual colored stools (red or black). ? Unusual  bruising for unknown reasons. ? A serious fall or if you hit your head (even if there is no bleeding).  Some medicines may interact with Eliquis and might increase your risk of bleeding or clotting while on Eliquis. To help avoid this, consult your healthcare provider or pharmacist prior to using any new prescription or non-prescription medications, including herbals, vitamins, non-steroidal anti-inflammatory drugs (NSAIDs) and supplements.  This website has more information on Eliquis (apixaban): http://www.eliquis.com/eliquis/home

## 2020-06-04 NOTE — Procedures (Signed)
Tracheostomy Change Note  Patient Details:   Name: Merriel Zinger DOB: 10-15-1985 MRN: [id number]    Airway Documentation:  Trach downsized from 6 cuffless shiley to 4 cuffless shiley by me & [facility] RRT. Positive color change on EZ cap post trach change.  Evaluation  O2 sats: stable throughout Complications: No apparent complications Patient did tolerate procedure well. Bilateral Breath Sounds: Clear, Diminished    Kathie Dike 06/04/2020, 4:28 PM
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
## 2020-06-05 ENCOUNTER — Inpatient Hospital Stay (HOSPITAL_COMMUNITY): Payer: Medicaid Other

## 2020-06-05 LAB — BASIC METABOLIC PANEL
Anion gap: 12 (ref 5–15)
BUN: 15 mg/dL (ref 6–20)
CO2: 22 mmol/L (ref 22–32)
Calcium: 10 mg/dL (ref 8.9–10.3)
Chloride: 106 mmol/L (ref 98–111)
Creatinine, Ser: 0.44 mg/dL (ref 0.44–1.00)
GFR, Estimated: >60 mL/min (ref >60)
Glucose, Bld: 111 mg/dL — ABNORMAL HIGH (ref 70–99)
Potassium: 5.2 mmol/L — ABNORMAL HIGH (ref 3.5–5.1)
Sodium: 140 mmol/L (ref 135–145)

## 2020-06-05 LAB — GLUCOSE, CAPILLARY
Glucose-Capillary: 97 mg/dL (ref 70–99)
Glucose-Capillary: 109 mg/dL — ABNORMAL HIGH (ref 70–99)
Glucose-Capillary: 113 mg/dL — ABNORMAL HIGH (ref 70–99)
Glucose-Capillary: 126 mg/dL — ABNORMAL HIGH (ref 70–99)
Glucose-Capillary: 107 mg/dL — ABNORMAL HIGH (ref 70–99)
Glucose-Capillary: 145 mg/dL — ABNORMAL HIGH (ref 70–99)

## 2020-06-05 LAB — CBC
HCT: 33.7 % — ABNORMAL LOW (ref 36.0–46.0)
Hemoglobin: 10.7 g/dL — ABNORMAL LOW (ref 12.0–15.0)
MCH: 30.4 pg (ref 26.0–34.0)
MCHC: 31.8 g/dL (ref 30.0–36.0)
MCV: 95.7 fL (ref 80.0–100.0)
Platelets: 741 10*3/uL — ABNORMAL HIGH (ref 150–400)
RBC: 3.52 MIL/uL — ABNORMAL LOW (ref 3.87–5.11)
RDW: 12.7 % (ref 11.5–15.5)
WBC: 12 10*3/uL — ABNORMAL HIGH (ref 4.0–10.5)
nRBC: 0.4 % — ABNORMAL HIGH (ref 0.0–0.2)

## 2020-06-05 MED ORDER — NYSTATIN 100000 UNIT/GM EX OINT
TOPICAL_OINTMENT | Freq: Two times a day (BID) | CUTANEOUS | Status: DC
  Filled 2020-06-05 (×2): qty 15

## 2020-06-05 MED ORDER — SODIUM POLYSTYRENE SULFONATE 15 GM/60ML PO SUSP
15.0000 g | Freq: Once | ORAL | Status: AC
  Administered 2020-06-05: 15 g
  Filled 2020-06-05: qty 60

## 2020-06-05 MED ORDER — NYSTATIN 100000 UNIT/GM EX OINT
TOPICAL_OINTMENT | Freq: Two times a day (BID) | CUTANEOUS | Status: AC
  Administered 2020-06-08: 1 via TOPICAL
  Filled 2020-06-05: qty 15
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
## 2020-06-05 NOTE — Progress Notes (Signed)
Inpatient Rehabilitation-Admissions Coordinator   [facility] will begin insurance auth process for possible admit next week. Please call if questions.   Raechel Ache, OTR/L  Rehab Admissions Coordinator  [phone] 06/05/2020 10:02 AM

## 2020-06-05 NOTE — Procedures (Signed)
Tracheostomy Change Note  Patient Details:   Name: Raha Tennison DOB: [birth year] MRN: [id number]    Airway Documentation: Patient decannulated per Saverio Danker, PA d/t trach being in a false trach. Sats 100% and patient tolerating well. Will continue to monitor.    Evaluation  O2 sats: stable throughout Complications: No apparent complications Patient did tolerate procedure well. Bilateral Breath Sounds: Clear, Diminished    Ander Purpura 06/05/2020, 3:36 PM

## 2020-06-05 NOTE — Progress Notes (Signed)
Patient noted to have small amount of yellow/brown drainage around PEG tube insertion site. Barkley Boards, PA-C assessed see new orders.
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
## 2020-06-06 LAB — GLUCOSE, CAPILLARY
Glucose-Capillary: 103 mg/dL — ABNORMAL HIGH (ref 70–99)
Glucose-Capillary: 128 mg/dL — ABNORMAL HIGH (ref 70–99)
Glucose-Capillary: 154 mg/dL — ABNORMAL HIGH (ref 70–99)
Glucose-Capillary: 135 mg/dL — ABNORMAL HIGH (ref 70–99)

## 2020-06-06 MED ORDER — WHITE PETROLATUM EX OINT
TOPICAL_OINTMENT | CUTANEOUS | Status: AC
  Filled 2020-06-06: qty 28.35
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
## 2020-06-07 LAB — CBC
HCT: 34.6 % — ABNORMAL LOW (ref 36.0–46.0)
Hemoglobin: 11 g/dL — ABNORMAL LOW (ref 12.0–15.0)
MCH: 30.4 pg (ref 26.0–34.0)
MCHC: 31.8 g/dL (ref 30.0–36.0)
MCV: 95.6 fL (ref 80.0–100.0)
Platelets: 792 10*3/uL — ABNORMAL HIGH (ref 150–400)
RBC: 3.62 MIL/uL — ABNORMAL LOW (ref 3.87–5.11)
RDW: 12.7 % (ref 11.5–15.5)
WBC: 9.9 10*3/uL (ref 4.0–10.5)
nRBC: 0 % (ref 0.0–0.2)

## 2020-06-07 LAB — GLUCOSE, CAPILLARY
Glucose-Capillary: 106 mg/dL — ABNORMAL HIGH (ref 70–99)
Glucose-Capillary: 113 mg/dL — ABNORMAL HIGH (ref 70–99)
Glucose-Capillary: 114 mg/dL — ABNORMAL HIGH (ref 70–99)
Glucose-Capillary: 115 mg/dL — ABNORMAL HIGH (ref 70–99)
Glucose-Capillary: 117 mg/dL — ABNORMAL HIGH (ref 70–99)
Glucose-Capillary: 122 mg/dL — ABNORMAL HIGH (ref 70–99)
Glucose-Capillary: 133 mg/dL — ABNORMAL HIGH (ref 70–99)

## 2020-06-07 MED ORDER — METOPROLOL TARTRATE 25 MG/10 ML ORAL SUSPENSION
50.0000 mg | Freq: Two times a day (BID) | ORAL | Status: DC
  Administered 2020-06-07 – 2020-06-10 (×7): 50 mg
  Filled 2020-06-07 (×7): qty 20
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
## 2020-06-08 LAB — BASIC METABOLIC PANEL
Anion gap: 11 (ref 5–15)
BUN: 14 mg/dL (ref 6–20)
CO2: 27 mmol/L (ref 22–32)
Calcium: 9.8 mg/dL (ref 8.9–10.3)
Chloride: 102 mmol/L (ref 98–111)
Creatinine, Ser: 0.49 mg/dL (ref 0.44–1.00)
GFR, Estimated: >60 mL/min (ref >60)
Glucose, Bld: 124 mg/dL — ABNORMAL HIGH (ref 70–99)
Potassium: 4.2 mmol/L (ref 3.5–5.1)
Sodium: 140 mmol/L (ref 135–145)

## 2020-06-08 LAB — GLUCOSE, CAPILLARY
Glucose-Capillary: 106 mg/dL — ABNORMAL HIGH (ref 70–99)
Glucose-Capillary: 106 mg/dL — ABNORMAL HIGH (ref 70–99)
Glucose-Capillary: 126 mg/dL — ABNORMAL HIGH (ref 70–99)
Glucose-Capillary: 121 mg/dL — ABNORMAL HIGH (ref 70–99)
Glucose-Capillary: 130 mg/dL — ABNORMAL HIGH (ref 70–99)

## 2020-06-08 LAB — CBC
HCT: 35.8 % — ABNORMAL LOW (ref 36.0–46.0)
Hemoglobin: 11.4 g/dL — ABNORMAL LOW (ref 12.0–15.0)
MCH: 30.1 pg (ref 26.0–34.0)
MCHC: 31.8 g/dL (ref 30.0–36.0)
MCV: 94.5 fL (ref 80.0–100.0)
Platelets: 734 10*3/uL — ABNORMAL HIGH (ref 150–400)
RBC: 3.79 MIL/uL — ABNORMAL LOW (ref 3.87–5.11)
RDW: 12.6 % (ref 11.5–15.5)
WBC: 9.8 10*3/uL (ref 4.0–10.5)
nRBC: 0 % (ref 0.0–0.2)
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
## 2020-06-08 NOTE — Progress Notes (Signed)
Inpatient Rehabilitation-Admissions Coordinator   Received notification from insurance company that we will most likely have a determination for her CIR request by Wed/Thurs of this week. Will update as soon as I have received a decision.   Raechel Ache, OTR/L  Rehab Admissions Coordinator  [phone] 06/08/2020 11:20 AM

## 2020-06-09 LAB — GLUCOSE, CAPILLARY
Glucose-Capillary: 100 mg/dL — ABNORMAL HIGH (ref 70–99)
Glucose-Capillary: 103 mg/dL — ABNORMAL HIGH (ref 70–99)
Glucose-Capillary: 106 mg/dL — ABNORMAL HIGH (ref 70–99)
Glucose-Capillary: 116 mg/dL — ABNORMAL HIGH (ref 70–99)
Glucose-Capillary: 117 mg/dL — ABNORMAL HIGH (ref 70–99)
Glucose-Capillary: 132 mg/dL — ABNORMAL HIGH (ref 70–99)
Glucose-Capillary: 138 mg/dL — ABNORMAL HIGH (ref 70–99)

## 2020-06-09 NOTE — Progress Notes (Signed)
Patient ID: Beth [name], female   DOB: April 06, 1986, 34 y.o.   MRN: [id number] BP 108/65 (BP Location: Right Arm)   Pulse 93   Temp 98.7 F (37.1 C) (Axillary)   Resp (!) 21   Ht 5\' 6"  (1.676 m)   Wt 80 kg   SpO2 100%   BMI 28.47 kg/m  Continuing to improve neurologically Will need continued cognitive rehabilitation Moving all extremities Will continue to improve
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
## 2020-06-10 ENCOUNTER — Inpatient Hospital Stay (HOSPITAL_COMMUNITY): Payer: Medicaid Other

## 2020-06-10 LAB — GLUCOSE, CAPILLARY
Glucose-Capillary: 102 mg/dL — ABNORMAL HIGH (ref 70–99)
Glucose-Capillary: 83 mg/dL (ref 70–99)
Glucose-Capillary: 87 mg/dL (ref 70–99)
Glucose-Capillary: 89 mg/dL (ref 70–99)
Glucose-Capillary: 98 mg/dL (ref 70–99)

## 2020-06-10 MED ORDER — METOPROLOL TARTRATE 25 MG/10 ML ORAL SUSPENSION
25.0000 mg | Freq: Two times a day (BID) | ORAL | Status: DC
  Administered 2020-06-10: 25 mg
  Filled 2020-06-10 (×2): qty 10

## 2020-06-10 NOTE — Progress Notes (Addendum)
The patient was robbing her right hand on her stomach when medication was administered via Peg tube indicating possible pain. Noted the Peg tube to be leaking this morning around 7 am. The feeding tube is on hold due to the problem described above.  Called and notified Alferd Apa  PA to obtain an  order for abdominal X-Ray. He stated he's coming to see her soon and will go from there. Will continue to monitor.

## 2020-06-10 NOTE — Progress Notes (Signed)
This nurse allowed patient medications to gravity flow through PEG as she continued to show signs of pain/discomfort when medication are pushed through PEG. Patient remained at ease with gravity flow.
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
## 2020-06-10 NOTE — Progress Notes (Signed)
PEG Xray reviewed. PEG tube in good location. Discussed with RN that we could restart tube feeds and titrate back up to goal.   Alferd Apa, [facility] Surgery 8:42 AM, 06/10/2020

## 2020-06-10 NOTE — Progress Notes (Signed)
Upon administering medication through PEG patient appeared to have discomfort. Micheal, PA-C in room to observe patient's reaction to pushing meds through PEG feels patient's discomfort did not change during administration okay to continue giving medications through PEG slowly.
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
## 2020-06-10 NOTE — Progress Notes (Signed)
Inpatient Rehabilitation-Admissions Coordinator   Received insurance approval for admit to CIR. Following for bed availability at this time.   Raechel Ache, OTR/L  Rehab Admissions Coordinator  [phone] 06/10/2020 3:48 PM

## 2020-06-10 NOTE — TOC Progression Note (Signed)
Transition of Care [facility]) - Progression Note    Patient Details  Name: Beth [name] MRN: [id number] Date of Birth: 07-01-1986  [facility] of [facility]) CM/SW Contact  Ella Bodo, RN Phone Number: 06/10/2020, 4:58 PM  Clinical Narrative: Insurance authorization received, per admissions coordinator for CIR.  Plan discharge to CIR next 24 to 48 hours.  Prepared letter for patient's mother, requesting to be moved from third floor apartment here in [place] to ground floor apartment in preparation for daughters discharge from rehab.  Letter has been left in chart, and mother is aware of this.    Expected Discharge Plan: IP Rehab Facility Barriers to Discharge: Continued Medical Work up  Expected Discharge Plan and Services Expected Discharge Plan: [place]                                               Social Determinants of Health (SDOH) Interventions    Readmission Risk Interventions No flowsheet data found.  Reinaldo Raddle, RN, BSN  Trauma/Neuro ICU Case Manager [phone]
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
## 2020-06-10 NOTE — Progress Notes (Signed)
Patient ID: Beth [name], female   DOB: August 29, 1985, 34 y.o.   MRN: [id number]  Alert, shakes head, trying to speak Follows some commands Moving extremitiess

## 2020-06-11 ENCOUNTER — Inpatient Hospital Stay (HOSPITAL_COMMUNITY): Payer: Medicaid Other

## 2020-06-11 ENCOUNTER — Encounter (HOSPITAL_COMMUNITY): Payer: Self-pay | Admitting: Surgery

## 2020-06-11 ENCOUNTER — Inpatient Hospital Stay (HOSPITAL_COMMUNITY)
Admission: RE | Admit: 2020-06-11 | Discharge: 2020-07-22 | DRG: 945 | Disposition: A | Discharge status: Home / Self Care | Payer: Medicaid Other | Source: Intra-hospital | Attending: Physical Medicine & Rehabilitation | Admitting: Physical Medicine & Rehabilitation

## 2020-06-11 DIAGNOSIS — Z20822 Contact with and (suspected) exposure to covid-19: Secondary | ICD-10-CM | POA: Diagnosis present

## 2020-06-11 DIAGNOSIS — R159 Full incontinence of feces: Secondary | ICD-10-CM | POA: Diagnosis present

## 2020-06-11 DIAGNOSIS — D62 Acute posthemorrhagic anemia: Secondary | ICD-10-CM

## 2020-06-11 DIAGNOSIS — R4587 Impulsiveness: Secondary | ICD-10-CM | POA: Diagnosis present

## 2020-06-11 DIAGNOSIS — T148XXA Other injury of unspecified body region, initial encounter: Secondary | ICD-10-CM

## 2020-06-11 DIAGNOSIS — B002 Herpesviral gingivostomatitis and pharyngotonsillitis: Secondary | ICD-10-CM

## 2020-06-11 DIAGNOSIS — S069X9S Unspecified intracranial injury with loss of consciousness of unspecified duration, sequela: Secondary | ICD-10-CM | POA: Diagnosis not present

## 2020-06-11 DIAGNOSIS — G479 Sleep disorder, unspecified: Secondary | ICD-10-CM

## 2020-06-11 DIAGNOSIS — Z781 Physical restraint status: Secondary | ICD-10-CM

## 2020-06-11 DIAGNOSIS — S069X4S Unspecified intracranial injury with loss of consciousness of 6 hours to 24 hours, sequela: Secondary | ICD-10-CM | POA: Diagnosis not present

## 2020-06-11 DIAGNOSIS — Z931 Gastrostomy status: Secondary | ICD-10-CM

## 2020-06-11 DIAGNOSIS — I959 Hypotension, unspecified: Secondary | ICD-10-CM | POA: Diagnosis not present

## 2020-06-11 DIAGNOSIS — S0240ED Zygomatic fracture, right side, subsequent encounter for fracture with routine healing: Secondary | ICD-10-CM

## 2020-06-11 DIAGNOSIS — S06359D Traumatic hemorrhage of left cerebrum with loss of consciousness of unspecified duration, subsequent encounter: Principal | ICD-10-CM

## 2020-06-11 DIAGNOSIS — T07XXXA Unspecified multiple injuries, initial encounter: Secondary | ICD-10-CM | POA: Diagnosis not present

## 2020-06-11 DIAGNOSIS — F981 Encopresis not due to a substance or known physiological condition: Secondary | ICD-10-CM

## 2020-06-11 DIAGNOSIS — S62619A Displaced fracture of proximal phalanx of unspecified finger, initial encounter for closed fracture: Secondary | ICD-10-CM

## 2020-06-11 DIAGNOSIS — R197 Diarrhea, unspecified: Secondary | ICD-10-CM | POA: Diagnosis present

## 2020-06-11 DIAGNOSIS — S36116D Major laceration of liver, subsequent encounter: Secondary | ICD-10-CM

## 2020-06-11 DIAGNOSIS — S069X9A Unspecified intracranial injury with loss of consciousness of unspecified duration, initial encounter: Principal | ICD-10-CM | POA: Diagnosis present

## 2020-06-11 DIAGNOSIS — S6291XD Unspecified fracture of right wrist and hand, subsequent encounter for fracture with routine healing: Secondary | ICD-10-CM | POA: Diagnosis not present

## 2020-06-11 DIAGNOSIS — S069X0S Unspecified intracranial injury without loss of consciousness, sequela: Secondary | ICD-10-CM | POA: Diagnosis not present

## 2020-06-11 DIAGNOSIS — K9423 Gastrostomy malfunction: Secondary | ICD-10-CM

## 2020-06-11 DIAGNOSIS — S069X0D Unspecified intracranial injury without loss of consciousness, subsequent encounter: Secondary | ICD-10-CM | POA: Diagnosis not present

## 2020-06-11 DIAGNOSIS — Z86718 Personal history of other venous thrombosis and embolism: Secondary | ICD-10-CM

## 2020-06-11 DIAGNOSIS — Z930 Tracheostomy status: Secondary | ICD-10-CM | POA: Diagnosis not present

## 2020-06-11 DIAGNOSIS — R32 Unspecified urinary incontinence: Secondary | ICD-10-CM | POA: Diagnosis present

## 2020-06-11 DIAGNOSIS — E162 Hypoglycemia, unspecified: Secondary | ICD-10-CM | POA: Diagnosis not present

## 2020-06-11 DIAGNOSIS — S62617D Displaced fracture of proximal phalanx of left little finger, subsequent encounter for fracture with routine healing: Secondary | ICD-10-CM | POA: Diagnosis not present

## 2020-06-11 DIAGNOSIS — S52272D Monteggia's fracture of left ulna, subsequent encounter for closed fracture with routine healing: Secondary | ICD-10-CM

## 2020-06-11 DIAGNOSIS — R7309 Other abnormal glucose: Secondary | ICD-10-CM

## 2020-06-11 DIAGNOSIS — S069X4D Unspecified intracranial injury with loss of consciousness of 6 hours to 24 hours, subsequent encounter: Secondary | ICD-10-CM | POA: Diagnosis not present

## 2020-06-11 DIAGNOSIS — D271 Benign neoplasm of left ovary: Secondary | ICD-10-CM | POA: Diagnosis present

## 2020-06-11 DIAGNOSIS — K121 Other forms of stomatitis: Secondary | ICD-10-CM

## 2020-06-11 DIAGNOSIS — R000 Tachycardia, unspecified: Secondary | ICD-10-CM | POA: Diagnosis not present

## 2020-06-11 DIAGNOSIS — S52122D Displaced fracture of head of left radius, subsequent encounter for closed fracture with routine healing: Secondary | ICD-10-CM

## 2020-06-11 DIAGNOSIS — R739 Hyperglycemia, unspecified: Secondary | ICD-10-CM

## 2020-06-11 DIAGNOSIS — R1312 Dysphagia, oropharyngeal phase: Secondary | ICD-10-CM | POA: Diagnosis not present

## 2020-06-11 DIAGNOSIS — S0181XD Laceration without foreign body of other part of head, subsequent encounter: Secondary | ICD-10-CM | POA: Diagnosis not present

## 2020-06-11 DIAGNOSIS — R1311 Dysphagia, oral phase: Secondary | ICD-10-CM | POA: Diagnosis present

## 2020-06-11 DIAGNOSIS — R030 Elevated blood-pressure reading, without diagnosis of hypertension: Secondary | ICD-10-CM | POA: Diagnosis not present

## 2020-06-11 DIAGNOSIS — S52122S Displaced fracture of head of left radius, sequela: Secondary | ICD-10-CM | POA: Diagnosis not present

## 2020-06-11 DIAGNOSIS — R451 Restlessness and agitation: Secondary | ICD-10-CM | POA: Diagnosis present

## 2020-06-11 DIAGNOSIS — S069XAA Unspecified intracranial injury with loss of consciousness status unknown, initial encounter: Secondary | ICD-10-CM | POA: Diagnosis present

## 2020-06-11 DIAGNOSIS — T82898A Other specified complication of vascular prosthetic devices, implants and grafts, initial encounter: Secondary | ICD-10-CM

## 2020-06-11 DIAGNOSIS — Y92410 Unspecified street and highway as the place of occurrence of the external cause: Secondary | ICD-10-CM

## 2020-06-11 HISTORY — PX: IR REPLC GASTRO/COLONIC TUBE PERCUT W/FLUORO: IMG2333

## 2020-06-11 LAB — GLUCOSE, CAPILLARY
Glucose-Capillary: 101 mg/dL — ABNORMAL HIGH (ref 70–99)
Glucose-Capillary: 101 mg/dL — ABNORMAL HIGH (ref 70–99)
Glucose-Capillary: 109 mg/dL — ABNORMAL HIGH (ref 70–99)
Glucose-Capillary: 126 mg/dL — ABNORMAL HIGH (ref 70–99)
Glucose-Capillary: 75 mg/dL (ref 70–99)
Glucose-Capillary: 75 mg/dL (ref 70–99)

## 2020-06-11 MED ORDER — FENTANYL CITRATE (PF) 100 MCG/2ML IJ SOLN
INTRAMUSCULAR | Status: AC
  Filled 2020-06-11: qty 2

## 2020-06-11 MED ORDER — ONDANSETRON HCL 4 MG/2ML IJ SOLN: 4.0000 mg | Freq: Four times a day (QID) | INTRAMUSCULAR | Status: DC | PRN

## 2020-06-11 MED ORDER — MIDAZOLAM HCL 2 MG/2ML IJ SOLN
INTRAMUSCULAR | Status: AC | PRN
  Administered 2020-06-11 (×2): 1 mg via INTRAVENOUS

## 2020-06-11 MED ORDER — APIXABAN 5 MG PO TABS
5.0000 mg | ORAL_TABLET | Freq: Two times a day (BID) | ORAL | Status: DC
  Administered 2020-06-11 – 2020-07-13 (×64): 5 mg
  Filled 2020-06-11 (×64): qty 1

## 2020-06-11 MED ORDER — OSMOLITE 1.5 CAL PO LIQD
1000.0000 mL | ORAL | Status: DC
  Administered 2020-06-11 – 2020-06-12 (×2): 1000 mL
  Filled 2020-06-11 (×2): qty 1000

## 2020-06-11 MED ORDER — LIDOCAINE VISCOUS HCL 2 % MT SOLN
OROMUCOSAL | Status: AC
  Filled 2020-06-11: qty 15

## 2020-06-11 MED ORDER — METOPROLOL TARTRATE 5 MG/5ML IV SOLN
5.0000 mg | Freq: Three times a day (TID) | INTRAVENOUS | Status: DC
  Administered 2020-06-11: 5 mg via INTRAVENOUS
  Filled 2020-06-11: qty 5

## 2020-06-11 MED ORDER — IOHEXOL 300 MG/ML  SOLN
50.0000 mL | Freq: Once | INTRAMUSCULAR | Status: AC | PRN
  Administered 2020-06-11: 20 mL

## 2020-06-11 MED ORDER — PROSOURCE TF PO LIQD
45.0000 mL | Freq: Three times a day (TID) | ORAL | Status: DC
  Administered 2020-06-11 – 2020-07-01 (×59): 45 mL
  Filled 2020-06-11 (×56): qty 45

## 2020-06-11 MED ORDER — FENTANYL CITRATE (PF) 100 MCG/2ML IJ SOLN
INTRAMUSCULAR | Status: AC | PRN
Start: 2020-06-11 — End: 2020-06-11
  Administered 2020-06-11: 50 ug via INTRAVENOUS

## 2020-06-11 MED ORDER — FOLIC ACID 1 MG PO TABS
1.0000 mg | ORAL_TABLET | Freq: Every day | ORAL | Status: DC
  Administered 2020-06-12 – 2020-06-27 (×16): 1 mg
  Filled 2020-06-11 (×16): qty 1

## 2020-06-11 MED ORDER — THIAMINE HCL 100 MG/ML IJ SOLN
100.0000 mg | Freq: Every day | INTRAMUSCULAR | Status: DC
  Filled 2020-06-11 (×10): qty 1

## 2020-06-11 MED ORDER — THIAMINE HCL 100 MG PO TABS
100.0000 mg | ORAL_TABLET | Freq: Every day | ORAL | Status: DC
  Administered 2020-06-12 – 2020-06-27 (×16): 100 mg
  Filled 2020-06-11 (×16): qty 1

## 2020-06-11 MED ORDER — ACETAMINOPHEN 325 MG PO TABS
325.0000 mg | ORAL_TABLET | ORAL | Status: DC | PRN
  Administered 2020-06-15 – 2020-07-10 (×13): 650 mg
  Filled 2020-06-11 (×15): qty 2

## 2020-06-11 MED ORDER — LIDOCAINE-EPINEPHRINE (PF) 1 %-1:200000 IJ SOLN
INTRAMUSCULAR | Status: AC | PRN
  Administered 2020-06-11: 5 mL

## 2020-06-11 MED ORDER — OXYCODONE HCL 5 MG/5ML PO SOLN
5.0000 mg | ORAL | Status: DC | PRN
  Administered 2020-06-11 – 2020-06-29 (×33): 5 mg
  Filled 2020-06-11 (×38): qty 5

## 2020-06-11 MED ORDER — DOCUSATE SODIUM 50 MG/5ML PO LIQD
50.0000 mg | Freq: Two times a day (BID) | ORAL | Status: DC | PRN
  Administered 2020-06-17 – 2020-07-07 (×5): 50 mg
  Filled 2020-06-11 (×5): qty 10

## 2020-06-11 MED ORDER — VITAMIN D 25 MCG (1000 UNIT) PO TABS
2000.0000 [IU] | ORAL_TABLET | Freq: Every day | ORAL | Status: DC
  Administered 2020-06-12 – 2020-07-13 (×32): 2000 [IU]
  Filled 2020-06-11 (×32): qty 2

## 2020-06-11 MED ORDER — ONDANSETRON 4 MG PO TBDP: 4.0000 mg | ORAL_TABLET | Freq: Four times a day (QID) | ORAL | Status: DC | PRN

## 2020-06-11 MED ORDER — LIDOCAINE-EPINEPHRINE 1 %-1:100000 IJ SOLN
INTRAMUSCULAR | Status: AC
  Filled 2020-06-11: qty 1

## 2020-06-11 MED ORDER — MIDAZOLAM HCL 2 MG/2ML IJ SOLN
INTRAMUSCULAR | Status: AC
  Filled 2020-06-11: qty 2

## 2020-06-11 MED ORDER — METHOCARBAMOL 500 MG PO TABS
500.0000 mg | ORAL_TABLET | Freq: Three times a day (TID) | ORAL | Status: DC | PRN
  Administered 2020-06-14 – 2020-06-27 (×7): 500 mg
  Filled 2020-06-11 (×7): qty 1

## 2020-06-11 MED ORDER — PANTOPRAZOLE SODIUM 40 MG PO PACK
40.0000 mg | PACK | Freq: Every day | ORAL | Status: DC
  Administered 2020-06-11 – 2020-07-08 (×28): 40 mg
  Filled 2020-06-11 (×25): qty 20

## 2020-06-11 MED ORDER — POLYETHYLENE GLYCOL 3350 17 G PO PACK
17.0000 g | PACK | Freq: Every day | ORAL | Status: DC | PRN
  Administered 2020-06-22 – 2020-07-01 (×4): 17 g
  Filled 2020-06-11 (×4): qty 1

## 2020-06-11 MED ORDER — ADULT MULTIVITAMIN W/MINERALS CH
1.0000 | ORAL_TABLET | Freq: Every day | ORAL | Status: DC
  Administered 2020-06-12 – 2020-07-09 (×28): 1
  Filled 2020-06-11 (×28): qty 1

## 2020-06-11 MED ORDER — FREE WATER
200.0000 mL | Status: DC
  Administered 2020-06-12 (×4): 200 mL
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
## 2020-06-11 NOTE — Progress Notes (Signed)
Report given to [place], RN on [facility] [place]. Room still being cleaned will transfer patient once clean.

## 2020-06-11 NOTE — Progress Notes (Signed)
Patient ID: Beth [name], female   DOB: [birth year], 34 y.o.   MRN: [id number] Notified by RN that she is having trouble giving meds per PEG again. I pulled back collar a bit but cannot advance it. PEG study OK yesterday and remains at 3cm at skin. In light of issues, will ask IR to exchange it.  Georganna Skeans, MD, MPH, FACS Please use [email or web address] to contact on call provider
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
## 2020-06-11 NOTE — Progress Notes (Signed)
Patient back on unit. Resting in bed with eyes closed. Vital signs stable. Sitter at bedside. PEG insertion noted to have minimal amt of bloody drainage will monitor drainage in 1 hour and continue with plan of care.

## 2020-06-11 NOTE — TOC Transition Note (Signed)
Transition of Care [facility]) - CM/SW Discharge Note   Patient Details  Name: Beth [name] MRN: [id number] Date of Birth: Mar 23, 1986  Transition of Care [facility]) CM/SW Contact:  Ella Bodo, RN Phone Number: 06/11/2020, 2:55 PM   Clinical Narrative:  Pt medically stable for discharge to CIR, and insurance authorization has been received by admissions coordinator.  Plan dc to CIR when bed available.      Final next level of care: IP Rehab Facility Barriers to Discharge: Barriers Resolved   Patient Goals and CMS Choice   CMS Medicare.gov Compare Post Acute Care list provided to:: Patient Represenative (must comment) (Parent) Choice offered to / list presented to : Parent                        Discharge Plan and Services     Post Acute Care Choice: IP Rehab                               Social Determinants of Health (SDOH) Interventions     Readmission Risk Interventions Readmission Risk Prevention Plan 06/11/2020  Post Dischage Appt Not Complete  Appt Comments Pt discharged to [facility]  Medication Screening Complete  Transportation Screening Complete   Reinaldo Raddle, RN, BSN  Trauma/Neuro ICU Case Manager [phone]

## 2020-06-11 NOTE — Procedures (Signed)
Pre procedural Dx: Dysphagia, poorly functioning feeding tube. Post procedural Dx: Same  Successful fluoroscopic guided replacement of exisitng 22 Fr gastrostomy tube.   The feeding tube is ready for immediate use.  EBL: Trace  Complications: None immediate.  Ronny Bacon, MD Pager #: [phone]

## 2020-06-11 NOTE — Progress Notes (Signed)
Order given to restart PEG tube use. Placement verified. Flushed without difficulty. Tube feedings restarted and medications given through PEG without any signs of discomfort.
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
## 2020-06-11 NOTE — Progress Notes (Signed)
This RN attempted to administer medications via PEG patient exhibits signs of pain and discomfort. Attempted to gravity flow through PEG but unable to. Notified Dr. Grandville Silos in room to assess PEG. Verbal order given to hold tube feeding and medications through PEG. Patient will have PEG replaced.

## 2020-06-11 NOTE — Progress Notes (Signed)
The patient is still rubbing and guarding her stomach during medications administration and at random intervals. She has been restless and agitated the whole night setting the bed alarm to go off frequently. Oxycodone PRN per peg tube and Morphine  IV for breakthrough pain administered for s/s of stomach pain  and she seems to be resting quietly at this time. Will continue to monitor
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
## 2020-06-11 NOTE — Progress Notes (Signed)
Patient ID: Beth [name], female   DOB: 11/17/1985, 34 y.o.   MRN: [id number] BP 108/81 (BP Location: Right Arm)   Pulse 97   Temp 98.9 F (37.2 C) (Axillary)   Resp 18   Ht 5\' 6"  (1.676 m)   Wt 80.6 kg   SpO2 97%   BMI 28.68 kg/m  Currently sleeping. I have observed movement earlier in the day Stereotypical head turning side to side Has been tracking with eyes.  stable

## 2020-06-11 NOTE — Progress Notes (Signed)
Inpatient Rehabilitation Medication Review by a Pharmacist  A complete drug regimen review was completed for this patient to identify any potential clinically significant medication issues.  Clinically significant medication issues were identified:  No  Check AMION for pharmacist assigned to patient if future medication questions/issues arise during this admission.  Time spent performing this drug regimen review (minutes):  [place], PharmD, BCPS, [facility] Clinical Pharmacist 06/11/2020 8:56 PM

## 2020-06-11 NOTE — Progress Notes (Signed)
Patient off unit to IR for PEG replacement.

## 2020-06-11 NOTE — Progress Notes (Signed)
Inpatient Rehabilitation-Admissions Coordinator   Received medical clearance from attending service for admit to CIR today. Notified pt's mother of bed offer and she has accepted. Reviewed insurance benefit letter and consent form with mother. Left copies in her room. All questions answered.   RN and [facility] team notified of plan for admit today.   Raechel Ache, OTR/L  Rehab Admissions Coordinator  [phone] 06/11/2020 2:59 PM

## 2020-06-12 ENCOUNTER — Encounter (HOSPITAL_COMMUNITY): Payer: Self-pay | Admitting: Physical Medicine & Rehabilitation

## 2020-06-12 ENCOUNTER — Inpatient Hospital Stay (HOSPITAL_COMMUNITY): Payer: Medicaid Other | Admitting: Speech Pathology

## 2020-06-12 ENCOUNTER — Other Ambulatory Visit: Payer: Self-pay

## 2020-06-12 ENCOUNTER — Inpatient Hospital Stay (HOSPITAL_COMMUNITY): Payer: Medicaid Other | Admitting: Occupational Therapy

## 2020-06-12 ENCOUNTER — Inpatient Hospital Stay (HOSPITAL_COMMUNITY): Payer: Medicaid Other

## 2020-06-12 DIAGNOSIS — S069X4S Unspecified intracranial injury with loss of consciousness of 6 hours to 24 hours, sequela: Secondary | ICD-10-CM

## 2020-06-12 LAB — CBC WITH DIFFERENTIAL/PLATELET
Abs Immature Granulocytes: 0.02 10*3/uL (ref 0.00–0.07)
Basophils Absolute: 0.1 10*3/uL (ref 0.0–0.1)
Basophils Relative: 1 %
Eosinophils Absolute: 0.4 10*3/uL (ref 0.0–0.5)
Eosinophils Relative: 5 %
HCT: 34.5 % — ABNORMAL LOW (ref 36.0–46.0)
Hemoglobin: 11 g/dL — ABNORMAL LOW (ref 12.0–15.0)
Immature Granulocytes: 0 %
Lymphocytes Relative: 19 %
Lymphs Abs: 1.8 10*3/uL (ref 0.7–4.0)
MCH: 29.5 pg (ref 26.0–34.0)
MCHC: 31.9 g/dL (ref 30.0–36.0)
MCV: 92.5 fL (ref 80.0–100.0)
Monocytes Absolute: 0.8 10*3/uL (ref 0.1–1.0)
Monocytes Relative: 8 %
Neutro Abs: 6.1 10*3/uL (ref 1.7–7.7)
Neutrophils Relative %: 67 %
Platelets: 491 10*3/uL — ABNORMAL HIGH (ref 150–400)
RBC: 3.73 MIL/uL — ABNORMAL LOW (ref 3.87–5.11)
RDW: 12.3 % (ref 11.5–15.5)
WBC: 9.1 10*3/uL (ref 4.0–10.5)
nRBC: 0 % (ref 0.0–0.2)

## 2020-06-12 LAB — COMPREHENSIVE METABOLIC PANEL
ALT: 40 U/L (ref 0–44)
AST: 26 U/L (ref 15–41)
Albumin: 3 g/dL — ABNORMAL LOW (ref 3.5–5.0)
Alkaline Phosphatase: 219 U/L — ABNORMAL HIGH (ref 38–126)
Anion gap: 8 (ref 5–15)
BUN: 17 mg/dL (ref 6–20)
CO2: 28 mmol/L (ref 22–32)
Calcium: 9.5 mg/dL (ref 8.9–10.3)
Chloride: 102 mmol/L (ref 98–111)
Creatinine, Ser: 0.5 mg/dL (ref 0.44–1.00)
GFR, Estimated: >60 mL/min (ref >60)
Glucose, Bld: 109 mg/dL — ABNORMAL HIGH (ref 70–99)
Potassium: 3.9 mmol/L (ref 3.5–5.1)
Sodium: 138 mmol/L (ref 135–145)
Total Bilirubin: 0.2 mg/dL — ABNORMAL LOW (ref 0.3–1.2)
Total Protein: 7.1 g/dL (ref 6.5–8.1)

## 2020-06-12 LAB — GLUCOSE, CAPILLARY
Glucose-Capillary: 111 mg/dL — ABNORMAL HIGH (ref 70–99)
Glucose-Capillary: 127 mg/dL — ABNORMAL HIGH (ref 70–99)
Glucose-Capillary: 87 mg/dL (ref 70–99)
Glucose-Capillary: 96 mg/dL (ref 70–99)

## 2020-06-12 MED ORDER — FREE WATER
200.0000 mL | Status: DC
  Administered 2020-06-12 – 2020-07-01 (×110): 200 mL

## 2020-06-12 MED ORDER — OSMOLITE 1.5 CAL PO LIQD
355.0000 mL | Freq: Three times a day (TID) | ORAL | Status: DC
  Administered 2020-06-12 – 2020-07-01 (×75): 355 mL
  Filled 2020-06-12 (×5): qty 474
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
## 2020-06-12 NOTE — Progress Notes (Signed)
Patient admitted to [facility]hab from [facility]. Arrived in bed by nurse and sitter, no family present. Restless and flailing back and forth in bed. Wrist restraint on but not connected. Bites right wrist ace wrap frequently, requiring frequent redirection. Peg tube covered with abdominal binder, requires repositioning often due to restless behavior. 1:1 sitter discontinue once telesitter was initiated. PainAD assessment done and medicated for pain accordingly. Patient does show to have relief for brief periods of time.

## 2020-06-12 NOTE — Discharge Instructions (Addendum)
Inpatient Rehab Discharge Instructions  [facility] Discharge date and time: No discharge date for patient encounter.   Activities/Precautions/ Functional Status: Activity: Weightbearing as tolerated left upper extremity Diet: Regular Wound Care: Routine skin checks Functional status:  ___ No restrictions     ___ Walk up steps independently ___ 24/7 supervision/assistance   ___ Walk up steps with assistance ___ Intermittent supervision/assistance  ___ Bathe/dress independently ___ Walk with walker     _x__ Bathe/dress with assistance ___ Walk Independently    ___ Shower independently ___ Walk with assistance    ___ Shower with assistance ___ No alcohol     ___ Return to work/school ________  COMMUNITY REFERRALS UPON DISCHARGE:   Home Health:   PT     OT     ST    RN    SNA     SW                Agency: [facility] Phone:[phone] *will begin with inhome therapies until pt is able to be accepted into their outpatient therapy program.   Outpatient: PT     OT   ST    Other: neuropsychology, orthotic, physiatry, recreation therapy, and podiatry             Agency: [facility] Phone: [phone]             Appointment Date/Time: *will be scheduled once assessed for appropriateness*  Medical Equipment/Items Ordered: 3in1 bedside commode, tub transfer bench, and wheelchair                                                 Agency/Supplier: [employer] [phone]  GENERAL COMMUNITY RESOURCES FOR PATIENT/FAMILY: Hospital Follow-up with Dr. Nolon Nations at [facility] on Monday, January 3 at 11:15am  Special Instructions: No driving smoking or alcohol  Flush PEG tube 50 mL every 12 hours  Patient will need removal of PEG tube mid January 2022   My questions have been answered and I understand these instructions. I will adhere to these goals and the provided educational materials after my discharge from the hospital.  Patient/Caregiver Signature _______________________________ Date  __________  Clinician Signature _______________________________________ Date __________  Please bring this form and your medication list with you to all your follow-up doctor's appointments.   ====================================================================  Information on my medicine - ELIQUIS (apixaban)  This medication education was reviewed with me or my healthcare representative as part of my discharge preparation.   Why was Eliquis prescribed for you? Eliquis was prescribed to treat blood clots that may have been found in the veins of your legs (deep vein thrombosis) or in your lungs (pulmonary embolism) and to reduce the risk of them occurring again.  What do You need to know about Eliquis ? The dose is ONE 5 mg tablet taken TWICE daily.  Eliquis may be taken with or without food.   Try to take the dose about the same time in the morning and in the evening. If you have difficulty swallowing the tablet whole please discuss with your pharmacist how to take the medication safely.  Take Eliquis exactly as prescribed and DO NOT stop taking Eliquis without talking to the doctor who prescribed the medication.  Stopping may increase your risk of developing a new blood clot.  Refill your prescription before you run out.  After discharge,  you should have regular check-up appointments with your healthcare provider that is prescribing your Eliquis.    What do you do if you miss a dose? If a dose of ELIQUIS is not taken at the scheduled time, take it as soon as possible on the same day and twice-daily administration should be resumed. The dose should not be doubled to make up for a missed dose.  Important Safety Information A possible side effect of Eliquis is bleeding. You should call your healthcare provider right away if you experience any of the following: ? Bleeding from an injury or your nose that does not stop. ? Unusual colored urine (red or dark brown) or unusual  colored stools (red or black). ? Unusual bruising for unknown reasons. ? A serious fall or if you hit your head (even if there is no bleeding).  Some medicines may interact with Eliquis and might increase your risk of bleeding or clotting while on Eliquis. To help avoid this, consult your healthcare provider or pharmacist prior to using any new prescription or non-prescription medications, including herbals, vitamins, non-steroidal anti-inflammatory drugs (NSAIDs) and supplements.  This website has more information on Eliquis (apixaban): http://www.eliquis.com/eliquis/home    ===========================================================  Deep Vein Thrombosis    Deep vein thrombosis (DVT) is a condition in which a blood clot forms in a deep vein, such as a lower leg, thigh, or arm vein. A clot is blood that has thickened into a gel or solid. This condition is dangerous. It can lead to serious and even life-threatening complications if the clot travels to the lungs and causes a blockage (pulmonary embolism). It can also damage veins in the leg. This can result in leg pain, swelling, discoloration, and sores (post-thrombotic syndrome).  What are the causes? This condition may be caused by:  A slowdown of blood flow.  Damage to a vein.  A condition that causes blood to clot more easily, such as an inherited clotting disorder.   What increases the risk? The following factors may make you more likely to develop this condition: 1. Being overweight. 2. Being older, especially over age 25. 61. Sitting or lying down for more than four hours. 4. Being in the hospital. 5. Lack of physical activity (sedentary lifestyle). 6. Pregnancy, being in childbirth, or having recently given birth. 7. Taking medicines that contain estrogen, such as medicines to prevent pregnancy. 8. Smoking. 9. A history of any of the following: ? Blood clots or a blood clotting disease. ? Peripheral vascular  disease. ? Inflammatory bowel disease. ? Cancer. ? Heart disease. ? Genetic conditions that affect how your blood clots, such as Factor V Leiden mutation. ? Neurological diseases that affect your legs (leg paresis). ? A recent injury, such as a car accident. ? Major or lengthy surgery. ? A central line placed inside a large vein.  What are the signs or symptoms? Symptoms of this condition include:  Swelling, pain, or tenderness in an arm or leg.  Warmth, redness, or discoloration in an arm or leg. If the clot is in your leg, symptoms may be more noticeable or worse when you stand or walk. Some people may not develop any symptoms.  How is this diagnosed? This condition is diagnosed with: 1. A medical history and physical exam. 2. Tests, such as: ? Blood tests. These are done to check how well your blood clots. ? Ultrasound. This is done to check for clots. ? Venogram. For this test, contrast dye is injected into a vein and  X-rays are taken to check for any clots  How is this treated? Treatment for this condition depends on:  The cause of your DVT.  Your risk for bleeding or developing more clots.  Any other medical conditions that you have. Treatment may include: 1. Taking a blood thinner (anticoagulant). This type of medicine prevents clots from forming. It may be taken by mouth, injected under the skin, or injected through an IV (catheter). 2. Injecting clot-dissolving medicines into the affected vein (catheter-directed thrombolysis). 3. Having surgery. Surgery may be done to: ? Remove the clot. ? Place a filter in a large vein to catch blood clots before they reach the lungs. Some treatments may be continued for up to six months.  Follow these instructions at home: If you are taking blood thinners: 1. Take the medicine exactly as told by your health care provider. Some blood thinners need to be taken at the same time every day. Do not skip a dose. 2. Talk with your  health care provider before you take any medicines that contain aspirin or NSAIDs. These medicines increase your risk for dangerous bleeding. 3. Ask your health care provider about foods and drugs that could change the way the medicine works (may interact). Avoid those things if your health care provider tells you to do so. 4. Blood thinners can cause easy bruising and may make it difficult to stop bleeding. Because of this: ? Be very careful when using knives, scissors, or other sharp objects. ? Use an electric razor instead of a blade. ? Avoid activities that could cause injury or bruising, and follow instructions about how to prevent falls. 5. Wear a medical alert bracelet or carry a card that lists what medicines you take.  General instructions  Take over-the-counter and prescription medicines only as told by your health care provider.  Return to your normal activities as told by your health care provider. Ask your health care provider what activities are safe for you.  Wear compression stockings if recommended by your health care provider.  Keep all follow-up visits as told by your health care provider. This is important.  How is this prevented? To lower your risk of developing this condition again: 1. For 30 or more minutes every day, do an activity that: ? Involves moving your arms and legs. ? Increases your heart rate. 2. When traveling for longer than four hours: ? Exercise your arms and legs every hour. ? Drink plenty of water. ? Avoid drinking alcohol. 3. Avoid sitting or lying for a long time without moving your legs. 4. If you have surgery or you are hospitalized, ask about ways to prevent blood clots. These may include taking frequent walks or using anticoagulants. 5. Stay at a healthy weight. 6. If you are a woman who is older than age 63, avoid unnecessary use of medicines that contain estrogen, such as some birth control pills. 7. Do not use any products that contain  nicotine or tobacco, such as cigarettes and e-cigarettes. This is especially important if you take estrogen medicines. If you need help quitting, ask your health care provider.  Contact a health care provider if:  You miss a dose of your blood thinner.  Your menstrual period is heavier than usual.  You have unusual bruising.  Get help right away if: 1. You have: ? New or increased pain, swelling, or redness in an arm or leg. ? Numbness or tingling in an arm or leg. ? Shortness of breath. ? Chest pain. ?  A rapid or irregular heartbeat. ? A severe headache or confusion. ? A cut that will not stop bleeding. 2. There is blood in your vomit, stool, or urine. 3. You have a serious fall or accident, or you hit your head. 4. You feel light-headed or dizzy. 5. You cough up blood.  These symptoms may represent a serious problem that is an emergency. Do not wait to see if the symptoms will go away. Get medical help right away. Call your local emergency services (911 in the [place] Do not drive yourself to the hospital. Summary  Deep vein thrombosis (DVT) is a condition in which a blood clot forms in a deep vein, such as a lower leg, thigh, or arm vein.  Symptoms can include swelling, warmth, pain, and redness in your leg or arm.  This condition may be treated with a blood thinner (anticoagulant medicine), medicine that is injected to dissolve blood clots,compression stockings, or surgery.  If you are prescribed blood thinners, take them exactly as told. This information is not intended to replace advice given to you by your health care provider. Make sure you discuss any questions you have with your health care provider. Document Revised: 06/23/2017 Document Reviewed: 12/09/2016 Elsevier Patient Education  [employer].

## 2020-06-12 NOTE — Progress Notes (Signed)
Pt continues to require use of tele sitter monitoring for behaviors: confusion, AMS, pulling at tubes/lines.
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
## 2020-06-12 NOTE — Progress Notes (Signed)
Inpatient Rehabilitation  Patient information reviewed and entered into eRehab system by Jovonta Levit M. Berit Raczkowski, M.A., CCC/SLP, PPS Coordinator.  Information including medical coding, functional ability and quality indicators will be reviewed and updated through discharge.    
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
## 2020-06-12 NOTE — Progress Notes (Signed)
Rectal tube discontinued per order. Tip intact. No signs of injury. 60 ml of NS removed from balloon. Pt tolerated well. 700 ml of brown colored stool noted in bag.   Purewick also removed at this time.   Peri care performed.

## 2020-06-13 ENCOUNTER — Inpatient Hospital Stay (HOSPITAL_COMMUNITY): Payer: Medicaid Other

## 2020-06-13 ENCOUNTER — Inpatient Hospital Stay (HOSPITAL_COMMUNITY): Payer: Medicaid Other | Admitting: Speech Pathology

## 2020-06-13 LAB — GLUCOSE, CAPILLARY
Glucose-Capillary: 121 mg/dL — ABNORMAL HIGH (ref 70–99)
Glucose-Capillary: 149 mg/dL — ABNORMAL HIGH (ref 70–99)
Glucose-Capillary: 74 mg/dL (ref 70–99)
Glucose-Capillary: 96 mg/dL (ref 70–99)
Glucose-Capillary: 99 mg/dL (ref 70–99)

## 2020-06-13 NOTE — Progress Notes (Signed)
Occupational Therapy Session Note  Patient Details  Name: Beth [name] MRN: [id number] Date of Birth: Jul 02, 1986  Today's Date: 06/13/2020 OT Individual Time: 1300-1330 OT Individual Time Calculation (min): 30 min  and Today's Date: 06/13/2020 OT Missed Time: 30 Minutes Missed Time Reason: Patient fatigue   Short Term Goals: Week 1:  OT Short Term Goal 1 (Week 1): Pt will participate 15 mins without attempts to return to supine OT Short Term Goal 2 (Week 1): Pt will engage in bathing with max assist OT Short Term Goal 3 (Week 1): Pt will complete UB dressing with max assist OT Short Term Goal 4 (Week 1): Pt will complete toilet transfer with max assist  Skilled Therapeutic Interventions/Progress Updates:    1;1. Pt received in bed extremely restless and intermttntly nodding yes/no. Pt shown picture of fiance and able to name appropriately. Unable to do second time with picture of sister as speech was garbled. Pt able to follow 1, 1-step command to "roll towards me" for OT to position chuck pad under patient to scoot pt to [facility]. OT dons missing sock total A. Pt positioned at EOB with total A, but within 30 seconds pt pushing/lies back down in supine around OT sitting next to her and swings LEs up into bed. Pt able to state, "I dont want to right now" when OT offers wash cloth to wash her face. Pt missed 30 min skilled OT d/t refusal   Therapy Documentation Precautions:  Precautions Precautions: Fall, Other (comment) Precaution Comments: Lt radial head fx; PIP fx Rt little finger, and MCP fx Rt ring finger; unrestricted ROM LUE per Dr Doreatha Martin note 10/29 Required Braces or Orthoses: Splint/Cast Splint/Cast: ulnar gutter splint (RUE); Lt elbow bean bag splint for elbow extension;  Lt UE NWB; peg Restrictions Weight Bearing Restrictions: Yes RUE Weight Bearing: Non weight bearing LUE Weight Bearing: Non weight bearing General:   Vital Signs:  Pain: Pain Assessment Pain Scale:  Faces Faces Pain Scale: No hurt ADL: ADL Eating: NPO Upper Body Bathing: Dependent Where Assessed-Upper Body Bathing: Bed level Lower Body Bathing: Dependent (+2) Where Assessed-Lower Body Bathing: Bed level Upper Body Dressing: Dependent Where Assessed-Upper Body Dressing: Bed level Lower Body Dressing: Dependent Where Assessed-Lower Body Dressing: Bed level Toileting: Dependent Vision   Perception    Praxis   Exercises:   Other Treatments:     Therapy/Group: Individual Therapy  Tonny Branch 06/13/2020, 1:29 PM

## 2020-06-13 NOTE — Evaluation (Signed)
Speech Language Pathology Assessment and Plan  Patient Details  Name: Murielle Stang MRN: [id number] Date of Birth: 1985/10/01  SLP Diagnosis: Cognitive Impairments;Dysphagia;Speech and Language deficits  Rehab Potential: Good ELOS: 4-5 weeks    Today's Date: 06/13/2020 SLP Individual Time: 1000-1045 SLP Individual Time Calculation (min): 45 min   Hospital Problem: Principal Problem:   TBI (traumatic brain injury) ([place])  Past Medical History: History reviewed. No pertinent past medical history. Past Surgical History:  Past Surgical History:  Procedure Laterality Date  . IR [place] TUBE PERCUT W/FLUORO  06/11/2020  . ORIF ULNAR FRACTURE Left 05/22/2020   Procedure: OPEN REDUCTION INTERNAL FIXATION (ORIF) ULNAR FRACTURE;  Surgeon: Shona Needles, MD;  Location: [place];  Service: Orthopedics;  Laterality: Left;  . PEG PLACEMENT N/A 05/22/2020   Procedure: PERCUTANEOUS ENDOSCOPIC GASTROSTOMY (PEG) PLACEMENT;  Surgeon: Jesusita Oka, MD;  Location: [place];  Service: General;  Laterality: N/A;  . TRACHEOSTOMY TUBE PLACEMENT N/A 05/22/2020   Procedure: TRACHEOSTOMY;  Surgeon: Jesusita Oka, MD;  Location: [place];  Service: General;  Laterality: N/A;    Assessment / Plan / Recommendation Clinical Impression  Patient presents with what appears to be a primary cognitive-based dysphagia as her alertness, attention are significantly impaired, leading to limited participation overall. Patient accepted small bites of ice cream and small straw sips of thin liquids (water, soda). No overt coughing observed and although patient is not consistently verbalizing, when she does, her voice sounds clear, vocal intensity is [facility]. Recommend patient remain NPO but as her alertness and ability to participate improves, she should not have significant issues with advancing to oral diet.    Skilled Therapeutic Interventions          Bedside swallow evaluation  SLP Assessment  Patient will need  skilled [facility] during CIR admission    Recommendations  SLP Diet Recommendations: NPO Medication Administration: Via alternative means Oral Care Recommendations: Oral care QID;Staff/trained caregiver to provide oral care Patient destination: Home Follow up Recommendations: 24 hour supervision/assistance;Home Health SLP;Outpatient SLP;Skilled Nursing facility Equipment Recommended: None recommended by SLP    SLP Frequency 3 to 5 out of 7 days   SLP Duration  SLP Intensity  SLP Treatment/Interventions 4-5 weeks  Minumum of 1-2 x/day, 30 to 90 minutes  Cognitive remediation/compensation;Cueing hierarchy;Environmental controls;Functional tasks;Internal/external aids;Dysphagia/aspiration precaution training;Speech/Language facilitation;Patient/family education     Care Tool Cognition Expression of Ideas and Wants     Understanding Verbal and Non-Verbal Content     Memory/Recall Ability *first 3 days only       PMSV Assessment  PMSV Trial    Bedside Swallowing Assessment General Date of Onset: 06/15/20 Previous Swallow Assessment: 06/10/20 Diet Prior to this Study: PEG tube;NPO Temperature Spikes Noted: No Respiratory Status: Room air History of Recent Intubation: Yes Length of Intubations (days): 23 days Date extubated: 06/06/20 Behavior/Cognition: Confused;Uncooperative;Distractible;Lethargic/Drowsy;Requires cueing;Doesn't follow directions;Fusing/Irritable Oral Cavity - Dentition: Adequate natural dentition Self-Feeding Abilities: Total assist Vision:  (difficult to assess, patient keeps eyes closed most of the time) Patient Positioning: Postural control interferes with function Baseline Vocal Quality: Other (comment) (limited verbalizations, however voice appears largely [facility]) Volitional Cough: Cognitively unable to elicit Volitional Swallow: Unable to elicit  Oral Care Assessment   Ice Chips Ice chips: Not tested Thin Liquid Thin  Liquid: Impaired Presentation: Straw Oral Phase Impairments: Poor awareness of bolus Other Comments: patient accepted limited amount of liquids through straw but was able to adequately suck and did not exhibit any overt coughing,  throat clearing Nectar Thick   Honey Thick   Puree Puree: Impaired Presentation: Spoon Oral Phase Impairments: Poor awareness of bolus Other Comments: Patient accepted small amounts of ice cream without overt oral or pharyngeal difficulties Solid Solid: Not tested BSE Assessment Risk for Aspiration Impact on safety and function: Mild aspiration risk Other Related Risk Factors: Cognitive impairment  Short Term Goals: Week 1: SLP Short Term Goal 1 (Week 1): Patient will demonstrate sustained attention for increments of 45-60 seconds with max A cues. SLP Short Term Goal 2 (Week 1): Patient will participate in PO trials with SLP with maxA. SLP Short Term Goal 3 (Week 1): Patient will respond to basic biographical and immediate environment yes/no questions with 70% accuracy. SLP Short Term Goal 4 (Week 1): Patient will initiate to perform basic level functional tasks (reaching out to pick up cup, etc) at least 3 times in a session with modA cues.  Refer to Care Plan for Long Term Goals  Recommendations for other services: Neuropsych  Discharge Criteria: Patient will be discharged from SLP if patient refuses treatment 3 consecutive times without medical reason, if treatment goals not met, if there is a change in medical status, if patient makes no progress towards goals or if patient is discharged from hospital.  The above assessment, treatment plan, treatment alternatives and goals were discussed and mutually agreed upon: No family available/patient unable  Sonia Baller, MA, CCC-SLP Speech Therapy

## 2020-06-13 NOTE — Progress Notes (Signed)
Physical Therapy Session Note  Patient Details  Name: Beth [name] MRN: [id number] Date of Birth: 12/03/1985  Today's Date: 06/13/2020 PT Individual Time: 5009-3818 PT Individual Time Calculation (min): 45 min   Short Term Goals: Week 1:  PT Short Term Goal 1 (Week 1): Patient will participate in meangingful therapy for >5 mins PT Short Term Goal 2 (Week 1): Patient will roll R/L with CGA consistently PT Short Term Goal 3 (Week 1): Patient will transition supine <> sit with MaxA x1 PT Short Term Goal 4 (Week 1): Patient will transfer bed<> wc MaxA x1 with LRAD  Skilled Therapeutic Interventions/Progress Updates:     Patient in bed with B mitts donned, TV on with door open, eyes open and alert, demonstrating restless behaviors (tossing and turning in the bed), upon PT arrival. Patient alert and agreeable to PT session. Patient denied pain, shook her head no when asked, during session.  Turned off TV, closed door, and left lights off to reduced stimulation in the room for improved restlessness. Patient non-verbal at beginning of session, only nodding yes/no and moaning. Appeared to nod yes/no appropriately to orientation questions, however, not consistent with responding to therapist. Patient incontinent of urine in brief. Required significantly increased time, >15 min and +2 assist to don brief due to restless movement and patient not able to follow 1-step cues. Rolled on her stomach when directed to roll on her back, and would actively resist rolling to the R without a second person assist. Peri-care and doffing/donning of incontinence brief performed with total A.  Patient unable to follow cues to come to sitting and spontaneously would bring her legs on the bed after PT brought them off and unable to safely come to sitting with 1 person assist. Came to sitting with max-total A +2 for lower extremity and trunk management x2. Assisted patient at her shoulders and patient wrapped her R forearm  around PT's shoulder to assist initially, however inconsistent with maintaining self assist. In sitting patient actively pushing back and to her R side to return to lying, improved with PT sitting EOB with patient leaning on PT for support. Patient tolerated sitting EOB x2-3 min x2 trials with lying rest break in between and posterior LOB x1 requirng max +2 to assist patient back to sitting. Required max of 1-2 for sitting balance.  Attempted sit to/from stand from EOB with B knees blocked and max A-total +2, however, patient actively pushing back with her legs and unable to come to full stand safely and returned to sitting.   Performed gentile PROM for improved mobility and muscle stretch of L wrist, elbow and shoulder. Continues to have limited elbow extension lacking approximately 20 degrees, and limited shoulder ER and abduction past neutral, shoulder flexion >90.   Patient fell asleep during ROM and lying in the bed calmly resting. Patient missed 30 min of skilled PT due to fatigue, RN made aware. Will attempt to make-up missed time this afternoon, as able.  Patient in the bed with breaks locked, bed alarm set, and all needs within reach.   Discussed rail pads or enclosure bed for patient with RN due to restlessness in the bed. Plan to monitor patient throughout the day and discuss at end of day.    Therapy Documentation Precautions:  Precautions Precautions: Fall, Other (comment) Precaution Comments: Lt radial head fx; PIP fx Rt little finger, and MCP fx Rt ring finger; unrestricted ROM LUE per Dr Doreatha Martin note 10/29 Required Braces or Orthoses: Splint/Cast Splint/Cast:  ulnar gutter splint (RUE); Lt elbow bean bag splint for elbow extension;  Lt UE NWB; peg Restrictions Weight Bearing Restrictions: Yes RUE Weight Bearing: Non weight bearing LUE Weight Bearing: Non weight bearing   Therapy/Group: Individual Therapy  Nickolaus Bordelon L Tiwan Schnitker PT, DPT  06/13/2020, 9:04 AM

## 2020-06-13 NOTE — Progress Notes (Signed)
Occupational Therapy Session Note  Patient Details  Name: Beth [name] MRN: [id number] Date of Birth: 01-May-1986  Today's Date: 06/13/2020 OT Individual Time: 1500-1515 OT Individual Time Calculation (min): 15 min    Short Term Goals: Week 1:  OT Short Term Goal 1 (Week 1): Pt will participate 15 mins without attempts to return to supine OT Short Term Goal 2 (Week 1): Pt will engage in bathing with max assist OT Short Term Goal 3 (Week 1): Pt will complete UB dressing with max assist OT Short Term Goal 4 (Week 1): Pt will complete toilet transfer with max assist  Skilled Therapeutic Interventions/Progress Updates:    1:1. Pt received in bed with OT and PT present as +2 for make up time. Pt agreeable to getting to EOB. Pt requires +2 A for all mobility Sup>sitting EOB<>standing at EOB with L knee blocked. Pt remains sitting EOB with multiple attempts to lie down with encouragement and total A to sit upright. Pt restless throughout but did initiate sit to stand. Pt states, "lay down" while standing and pt able to bring Les up into bed. Education on upright positioning and improving posture/balance provided. Exited session with pt seated in bed, exit alarm on and call light in reach.    Therapy Documentation Precautions:  Precautions Precautions: Fall, Other (comment) Precaution Comments: Lt radial head fx; PIP fx Rt little finger, and MCP fx Rt ring finger; unrestricted ROM LUE per Dr Doreatha Martin note 10/29 Required Braces or Orthoses: Splint/Cast Splint/Cast: ulnar gutter splint (RUE); Lt elbow bean bag splint for elbow extension;  Lt UE NWB; peg Restrictions Weight Bearing Restrictions: Yes RUE Weight Bearing: Non weight bearing LUE Weight Bearing: Non weight bearing General: General OT Amount of Missed Time: 30 Minutes Vital Signs: Therapy Vitals Temp: 98.3 F (36.8 C) Temp Source: Axillary Pulse Rate: (!) 105 Resp: 14 BP: 115/81 Oxygen Therapy SpO2: 100 % Pain:    ADL: ADL Eating: NPO Upper Body Bathing: Dependent Where Assessed-Upper Body Bathing: Bed level Lower Body Bathing: Dependent (+2) Where Assessed-Lower Body Bathing: Bed level Upper Body Dressing: Dependent Where Assessed-Upper Body Dressing: Bed level Lower Body Dressing: Dependent Where Assessed-Lower Body Dressing: Bed level Toileting: Dependent Vision   Perception    Praxis   Exercises:   Other Treatments:     Therapy/Group: Individual Therapy  Tonny Branch 06/13/2020, 3:42 PM

## 2020-06-13 NOTE — Plan of Care (Signed)
  Problem: Consults Goal: RH GENERAL PATIENT EDUCATION Description: See Patient Education module for education specifics. Outcome: Progressing Goal: Skin Care Protocol Initiated - if Braden Score 18 or less Description: If consults are not indicated, leave blank or document N/A Outcome: Progressing Goal: Nutrition Consult-if indicated Outcome: Progressing Goal: Diabetes Guidelines if Diabetic/Glucose > 140 Description: If diabetic or lab glucose is > 140 mg/dl - Initiate Diabetes/Hyperglycemia Guidelines & Document Interventions  Outcome: Progressing   Problem: RH BOWEL ELIMINATION Goal: RH STG MANAGE BOWEL WITH ASSISTANCE Description: STG Manage Bowel with Assistance. Outcome: Progressing   Problem: RH BLADDER ELIMINATION Goal: RH STG MANAGE BLADDER WITH ASSISTANCE Description: STG Manage Bladder With Assistance Outcome: Progressing   Problem: RH SKIN INTEGRITY Goal: RH STG SKIN FREE OF INFECTION/BREAKDOWN Description: No new skin impairments during this admission Outcome: Progressing Goal: RH STG MAINTAIN SKIN INTEGRITY WITH ASSISTANCE Description: STG Maintain Skin Integrity With Assistance. Outcome: Progressing   Problem: RH SAFETY Goal: RH STG ADHERE TO SAFETY PRECAUTIONS W/ASSISTANCE/DEVICE Description: STG Adhere to Safety Precautions With Assistance/Device. Outcome: Progressing   Problem: RH PAIN MANAGEMENT Goal: RH STG PAIN MANAGED AT OR BELOW PT'S PAIN GOAL Outcome: Progressing   Problem: RH KNOWLEDGE DEFICIT GENERAL Goal: RH STG INCREASE KNOWLEDGE OF SELF CARE AFTER HOSPITALIZATION Outcome: Progressing   Problem: Consults Goal: RH BRAIN INJURY PATIENT EDUCATION Description: Description: See Patient Education module for eduction specifics Outcome: Progressing

## 2020-06-14 DIAGNOSIS — S069X0D Unspecified intracranial injury without loss of consciousness, subsequent encounter: Secondary | ICD-10-CM

## 2020-06-14 LAB — GLUCOSE, CAPILLARY
Glucose-Capillary: 143 mg/dL — ABNORMAL HIGH (ref 70–99)
Glucose-Capillary: 73 mg/dL (ref 70–99)
Glucose-Capillary: 90 mg/dL (ref 70–99)
Glucose-Capillary: 92 mg/dL (ref 70–99)
Glucose-Capillary: 99 mg/dL (ref 70–99)

## 2020-06-14 MED ORDER — QUETIAPINE FUMARATE 25 MG PO TABS
25.0000 mg | ORAL_TABLET | Freq: Every evening | ORAL | Status: DC | PRN
  Administered 2020-06-14: 25 mg
  Filled 2020-06-14: qty 1
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
## 2020-06-14 NOTE — Plan of Care (Signed)
°  Problem: Consults Goal: Skin Care Protocol Initiated - if Braden Score 18 or less Description: If consults are not indicated, leave blank or document N/A Outcome: Progressing Goal: Nutrition Consult-if indicated Outcome: Progressing Goal: Diabetes Guidelines if Diabetic/Glucose > 140 Description: If diabetic or lab glucose is > 140 mg/dl - Initiate Diabetes/Hyperglycemia Guidelines & Document Interventions  Outcome: Progressing   Problem: RH BOWEL ELIMINATION Goal: RH STG MANAGE BOWEL WITH ASSISTANCE Description: STG Manage Bowel with Assistance. Outcome: Progressing   Problem: RH BLADDER ELIMINATION Goal: RH STG MANAGE BLADDER WITH ASSISTANCE Description: STG Manage Bladder With Assistance Outcome: Progressing   Problem: RH SKIN INTEGRITY Goal: RH STG SKIN FREE OF INFECTION/BREAKDOWN Description: No new skin impairments during this admission Outcome: Progressing Goal: RH STG MAINTAIN SKIN INTEGRITY WITH ASSISTANCE Description: STG Maintain Skin Integrity With Assistance. Outcome: Progressing   Problem: RH PAIN MANAGEMENT Goal: RH STG PAIN MANAGED AT OR BELOW PT'S PAIN GOAL Outcome: Progressing   Problem: RH KNOWLEDGE DEFICIT GENERAL Goal: RH STG INCREASE KNOWLEDGE OF SELF CARE AFTER HOSPITALIZATION Outcome: Progressing   Problem: Consults Goal: RH BRAIN INJURY PATIENT EDUCATION Description: Description: See Patient Education module for eduction specifics Outcome: Progressing   Problem: Consults Goal: RH GENERAL PATIENT EDUCATION Description: See Patient Education module for education specifics. Outcome: Not Progressing Note: Patient unable to understand or comprehend education at this time   Problem: RH SAFETY Goal: RH STG ADHERE TO SAFETY PRECAUTIONS W/ASSISTANCE/DEVICE Description: STG Adhere to Safety Precautions With Assistance/Device. Outcome: Not Progressing Note: Patient is at an increased need for further safety precautions. Currently biting at mittens  and hand splint. Patient able to get on al fours in bed and needed increased adjustments in bed so as to prevent falls.

## 2020-06-14 NOTE — Progress Notes (Signed)
Physical Therapy Session Note  Patient Details  Name: Beth [name] MRN: [id number] Date of Birth: 1986/06/18  Today's Date: 06/14/2020 PT Individual Time: 0925-0940 PT Individual Time Calculation (min): 15 min   Short Term Goals: Week 1:  PT Short Term Goal 1 (Week 1): Patient will participate in meangingful therapy for >5 mins PT Short Term Goal 2 (Week 1): Patient will roll R/L with CGA consistently PT Short Term Goal 3 (Week 1): Patient will transition supine <> sit with MaxA x1 PT Short Term Goal 4 (Week 1): Patient will transfer bed<> wc MaxA x1 with LRAD  Skilled Therapeutic Interventions/Progress Updates:     PT walking past patient's room and observed patient with her hips over the bed rail and at significant risk of falling out of the bed. PT entered patient's room to block patient's hips and return her to lying on the bed. Noted patient and her bed were saturated in urine. PT provided max-total A for rolling R/L to doff sheets, incontinence brief, and shirt with total A. RN entered with extra sheets. Cleaned the bed and performed peri-care with total A. Patient performed rolling again with max-total A to place new sheets don incontinence brief and a new gown. Provided one step cues for patient to roll, attempted both goal directed cues and step-by step cues with minimal initiation. Patient did bend her knees following cues x1. Patient extremely restless in the bed throughout. Expressed concerns to RN about patient's safety in a regular bed due to restlessness and confusion. Also, informed her of the near fall that initiated this session. Patient in bed with RN in the room at end of session with breaks locked, bed alarm set, and all needs within reach.    Therapy Documentation Precautions:  Precautions Precautions: Fall, Other (comment) Precaution Comments: Lt radial head fx; PIP fx Rt little finger, and MCP fx Rt ring finger; unrestricted ROM LUE per Dr Doreatha Martin note 10/29 Required  Braces or Orthoses: Splint/Cast Splint/Cast: ulnar gutter splint (RUE); Lt elbow bean bag splint for elbow extension;  Lt UE NWB; peg Restrictions Weight Bearing Restrictions: Yes RUE Weight Bearing: Non weight bearing LUE Weight Bearing: Non weight bearing   Therapy/Group: Individual Therapy  Beth [name] PT, DPT  06/14/2020, 12:48 PM
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
## 2020-06-15 ENCOUNTER — Inpatient Hospital Stay (HOSPITAL_COMMUNITY): Payer: Medicaid Other | Admitting: Physical Therapy

## 2020-06-15 ENCOUNTER — Inpatient Hospital Stay (HOSPITAL_COMMUNITY): Payer: Medicaid Other | Admitting: Occupational Therapy

## 2020-06-15 ENCOUNTER — Inpatient Hospital Stay (HOSPITAL_COMMUNITY): Payer: Medicaid Other | Admitting: Speech Pathology

## 2020-06-15 ENCOUNTER — Encounter (HOSPITAL_COMMUNITY): Payer: Self-pay | Admitting: Physical Medicine & Rehabilitation

## 2020-06-15 DIAGNOSIS — S069X9S Unspecified intracranial injury with loss of consciousness of unspecified duration, sequela: Secondary | ICD-10-CM

## 2020-06-15 DIAGNOSIS — F0281 Dementia in other diseases classified elsewhere with behavioral disturbance: Secondary | ICD-10-CM

## 2020-06-15 DIAGNOSIS — R1312 Dysphagia, oropharyngeal phase: Secondary | ICD-10-CM

## 2020-06-15 DIAGNOSIS — S52122S Displaced fracture of head of left radius, sequela: Secondary | ICD-10-CM

## 2020-06-15 LAB — GLUCOSE, CAPILLARY
Glucose-Capillary: 108 mg/dL — ABNORMAL HIGH (ref 70–99)
Glucose-Capillary: 74 mg/dL (ref 70–99)
Glucose-Capillary: 81 mg/dL (ref 70–99)
Glucose-Capillary: 83 mg/dL (ref 70–99)
Glucose-Capillary: 86 mg/dL (ref 70–99)

## 2020-06-15 MED ORDER — PROPRANOLOL HCL 10 MG PO TABS
10.0000 mg | ORAL_TABLET | Freq: Three times a day (TID) | ORAL | Status: DC
  Administered 2020-06-15 – 2020-06-21 (×15): 10 mg
  Filled 2020-06-15 (×20): qty 1

## 2020-06-15 MED ORDER — LORAZEPAM 2 MG/ML IJ SOLN
0.5000 mg | Freq: Once | INTRAMUSCULAR | Status: AC
  Administered 2020-06-15: 0.5 mg via INTRAMUSCULAR
  Filled 2020-06-15: qty 1

## 2020-06-15 MED ORDER — LORAZEPAM 0.5 MG PO TABS
0.5000 mg | ORAL_TABLET | Freq: Once | ORAL | Status: AC
  Administered 2020-06-15: 0.5 mg

## 2020-06-15 MED ORDER — QUETIAPINE FUMARATE 50 MG PO TABS
50.0000 mg | ORAL_TABLET | Freq: Every day | ORAL | Status: DC
  Administered 2020-06-15: 50 mg
  Filled 2020-06-15: qty 1

## 2020-06-15 MED ORDER — LORAZEPAM 0.5 MG PO TABS
0.5000 mg | ORAL_TABLET | Freq: Once | ORAL | Status: DC
  Filled 2020-06-15: qty 1

## 2020-06-15 MED ORDER — QUETIAPINE FUMARATE 50 MG PO TABS
50.0000 mg | ORAL_TABLET | Freq: Every evening | ORAL | Status: DC | PRN
  Administered 2020-06-17: 50 mg
  Filled 2020-06-15 (×2): qty 1

## 2020-06-15 NOTE — Care Management (Signed)
[facility] Individual Statement of Services  Patient Name:  Beth [name]  Date:  06/15/2020  Welcome to the [facility].  Our goal is to provide you with an individualized program based on your diagnosis and situation, designed to meet your specific needs.  With this comprehensive rehabilitation program, you will be expected to participate in at least 3 hours of rehabilitation therapies Monday-Friday, with modified therapy programming on the weekends.  Your rehabilitation program will include the following services:  Physical Therapy (PT), Occupational Therapy (OT), Speech Therapy (ST), 24 hour per day rehabilitation nursing, Therapeutic Recreaction (TR), Psychology, Neuropsychology, Care Coordinator, Rehabilitation Medicine, Nutrition Services, Pharmacy Services and Other  Weekly team conferences will be held on Tuesdays to discuss your progress.  Your Inpatient Rehabilitation Care Coordinator will talk with you frequently to get your input and to update you on team discussions.  Team conferences with you and your family in attendance may also be held.  Expected length of stay: 4-5 weeks   Overall anticipated outcome: Minimal Assistance  Depending on your progress and recovery, your program may change. Your Inpatient Rehabilitation Care Coordinator will coordinate services and will keep you informed of any changes. Your Inpatient Rehabilitation Care Coordinator's name and contact numbers are listed  below.  The following services may also be recommended but are not provided by the [facility] will be made to provide these services after discharge if needed.  Arrangements include referral to agencies that provide these services.  Your insurance has been verified to be:  Medicaid Advantage  out of state/BCBS Medicaid Advantage  Your primary doctor is:  No PCP  Pertinent information will be shared with your doctor and your insurance company.  Inpatient Rehabilitation Care Coordinator:  Cathleen Corti [phone] or [phone]  Information discussed with and copy given to patient by: Rana Snare, 06/15/2020, 10:15 AM

## 2020-06-15 NOTE — Progress Notes (Signed)
Orthopedic Tech Progress Note Patient Details:  Beth [name] April 14, 1986 [id number]  Ortho Devices Type of Ortho Device: Abdominal binder Ortho Device/Splint Interventions: Ordered, Application   Post Interventions Patient Tolerated: Well Instructions Provided: Care of device, Poper ambulation with device, Adjustment of device   [name] P [name] 06/15/2020, 1:19 PM
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
## 2020-06-15 NOTE — Plan of Care (Addendum)
Behavioral Plan  UPDATED 07/07/2020  [place] Level: VI  Behavior to decrease/ eliminate:  Restlessness Lethargy/decreased arousal Agitation, swinging at staff  Changes to environment:  Lights on during day to encourage wakefullness No TV Limit # of visitors in the room to 2 at a time, max of 3 staff members (no students) Enjoys classical music (Mozart) (can play on computer in her room) NO IPAD or Cell phone in the enclosure bed with her  Interventions: Enclosure bed Telesitter Abdominal binder Sleep/wake chart Timed toileting every 2 hours Before family leaves for the day, make sure patient is back in bed  Recommendations for interactions with patient: Don't pull on upper extremities when repositioning or transfers Nursing should only be doing bedside commode toilet transfers with patient    Attendees:  Weston Anna, SLP Dorthula Nettles, RN Apolinar Junes, PT Leretha Pol, OT Canary Brim, PT Excell Seltzer, PT Cherylynn Ridges, OT Tereasa Coop, PT Tereasa Coop, PT Cherylynn Ridges. OT

## 2020-06-15 NOTE — Progress Notes (Addendum)
Patient ID: Beth [name], female   DOB: [birth year], 34 y.o.   MRN: [id number]  SW completed phone assessment with [name] [phone]). Her mother, step father, fiance, [name] Her mother plans on staying here. [name] [phone]) and their three children.   *Pt mother called back to inform that [name]   Loralee Pacas, MSW, [place] Office: [phone] Cell: [phone] Fax: [phone]

## 2020-06-15 NOTE — Progress Notes (Signed)
Speech Language Pathology Daily Session Note  Patient Details  Name: Beth [name] MRN: [id number] Date of Birth: October 16, 1985  Today's Date: 06/15/2020 SLP Individual Time: 1300-1340 SLP Individual Time Calculation (min): 40 min and Today's Date: 06/15/2020 SLP Missed Time: 20 Minutes Missed Time Reason: Patient fatigue  Short Term Goals: Week 1: SLP Short Term Goal 1 (Week 1): Patient will demonstrate sustained attention for increments of 45-60 seconds with max A cues. SLP Short Term Goal 2 (Week 1): Patient will participate in PO trials with SLP with maxA. SLP Short Term Goal 3 (Week 1): Patient will respond to basic biographical and immediate environment yes/no questions with 70% accuracy. SLP Short Term Goal 4 (Week 1): Patient will initiate to perform basic level functional tasks (reaching out to pick up cup, etc) at least 3 times in a session with modA cues.  Skilled Therapeutic Interventions: Skilled treatment session focused on cognitive goals. Upon arrival, patient was awake in bed with one mitt removed. Patient had been incontinent of urine and required total +2 for self-care due to excessive restlessness and ability to follow commands. Patient's speech was mostly unintelligible throughout the session with intermittent excessive counting noted as well as intermittent appropriate verbal response such as "plesae stop." Patient did follow basic oral commands during oral care such as keep your mouth open with minimal attempts made at biting the suction toothbrush and "pucker up" when SLP donned chapstick. However, total A was needed to remove a washcloth from her face. Patient with intermittent moaning, suspect due to pain, therefore, RN administered medications. Patient demonstrated increased restlessness at end of session with excessive counting, moaning and pushing against the netting in the enclosure bed. SLP turned down the lights and turned on mozart classical music. Patient left secure  in enclosure bed. Continue with current plan of care.        Pain Pain Assessment Pain Scale: Faces Pain Score: 0-No pain Faces Pain Scale: Hurts a little bit Pain Type: Surgical pain Patients Stated Pain Goal: Other (Comment) Pain Intervention(s): Medication (See eMAR)  Therapy/Group: Individual Therapy  Yamir Carignan, [place] 06/15/2020, 3:11 PM

## 2020-06-15 NOTE — Progress Notes (Signed)
Spoke with Linna Hoff, PA related to increased restlessness after all prior interventions. PRN ativan ordered and given at 0516. Enclosure bed ordered, patient placed in bed for her safety. Called Mariane Baumgarten, patient's mother related to placing patient in enclosure. Also, made her aware of event of this shift. Currently patient is resting/sleeping. Will have oncoming shift check vitals, since she's calm. [name] A

## 2020-06-15 NOTE — Progress Notes (Signed)
Occupational Therapy Session Note  Patient Details  Name: Beth [name] MRN: [id number] Date of Birth: 05-29-1986  Today's Date: 06/15/2020 OT Individual Time: 1440-1455 OT Individual Time Calculation (min): 15 min    Short Term Goals: Week 1:  OT Short Term Goal 1 (Week 1): Pt will participate 15 mins without attempts to return to supine OT Short Term Goal 2 (Week 1): Pt will engage in bathing with max assist OT Short Term Goal 3 (Week 1): Pt will complete UB dressing with max assist OT Short Term Goal 4 (Week 1): Pt will complete toilet transfer with max assist  Skilled Therapeutic Interventions/Progress Updates:    Pt sidelying asleep in enclosure bed with classical music playing lightly. OT provided simple VCs to allow for gentle arousal from sleep, however pt did not awaken.  OT gently assessed right hand orthosis fit and positioning, noting pressure points at thenar eminence and middle phalanx of ring and small.  Also noted ace wrap fitting tightly and positioning thumb in adducted position, therefore rewrapped ace bandage to allow for neutral position of thumb and increased comfort while still providing support to orthosis. Plan to make splint adjustments next session.  Hand mitt reapplied, enclosure bed secured, telesitter and bed alarm on. Missed treatment time: 45 minutes due to lethargy.  Therapy Documentation Precautions:  Precautions Precautions: Fall, Other (comment) Precaution Comments: Lt radial head fx; PIP fx Rt little finger, and MCP fx Rt ring finger; unrestricted ROM LUE per Dr Doreatha Martin note 10/29 Required Braces or Orthoses: Splint/Cast Splint/Cast: ulnar gutter splint (RUE); Lt elbow bean bag splint for elbow extension;  Lt UE NWB; peg Restrictions Weight Bearing Restrictions: Yes RUE Weight Bearing: Non weight bearing LUE Weight Bearing: Non weight bearing   Therapy/Group: Individual Therapy  Ezekiel Slocumb 06/15/2020, 3:54 PM
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
## 2020-06-15 NOTE — Progress Notes (Signed)
Patient continued to be agitated during the morning hours and into the afternoon.  Patient finally settled down and started resting at 1430.  Patient continued to rest peacefully.  RN advised NT to not get vitals or CBG due to patient not able to sleep for the past 48 hours.  Patient's husband present at bedside and informed of how the past few days have gone.  Patient's husband agreed to not bother her and let her rest.

## 2020-06-15 NOTE — Progress Notes (Signed)
Physical Therapy Session Note  Patient Details  Name: Beth [name] MRN: [id number] Date of Birth: [birth year]  Today's Date: 06/15/2020 PT Individual Time: 0800-0900 PT Individual Time Calculation (min): 60 min   Short Term Goals: Week 1:  PT Short Term Goal 1 (Week 1): Patient will participate in meangingful therapy for >5 mins PT Short Term Goal 2 (Week 1): Patient will roll R/L with CGA consistently PT Short Term Goal 3 (Week 1): Patient will transition supine <> sit with MaxA x1 PT Short Term Goal 4 (Week 1): Patient will transfer bed<> wc MaxA x1 with LRAD  Skilled Therapeutic Interventions/Progress Updates:    Pt received sidelying in enclosure bed having pulled off her soiled (urine) brief with pieces of her brief scattered around inside of enclosure bed. Bed also found to be soaked through with urine. Assisted pt with doffing brief and performing pericare dependently. Pt frequently rolling L/R in bed independently and restlessly but then at other times falls asleep and unable to respond to therapist or assist with mobility. Set up cot bed in pt's room in attempt to have pt transfer out of enclosure bed for cleaning. Pt able to sit up to long-sitting position at edge of enclosure bed but unable to follow cues or understand purpose of getting out of bed. Pt requires assist x 2 to safely achieve long-sitting position but unable to maintain. Pt returns to sidelying in enclosure bed. Attempted to clean enclosure bed cushion with bleach wipes as able and placed sheet under patient. Assisted pt with donning clean pants at bed level with assist x 2. Pt also assist x 2 to dependently doff soiled tshirt and don new clean shirt. Sidelying BP assessed at 111/83 at end of session. Pt left in sidelying in bed in care of nursing for medication administration. Played Mozart classical music throughout session in attempt to soothe patient.   Therapy Documentation Precautions:  Precautions Precautions:  Fall, Other (comment) Precaution Comments: Lt radial head fx; PIP fx Rt little finger, and MCP fx Rt ring finger; unrestricted ROM LUE per Dr Doreatha Martin note 10/29 Required Braces or Orthoses: Splint/Cast Splint/Cast: ulnar gutter splint (RUE); Lt elbow bean bag splint for elbow extension;  Lt UE NWB; peg Restrictions Weight Bearing Restrictions: Yes RUE Weight Bearing: Non weight bearing LUE Weight Bearing: Non weight bearing   Therapy/Group: Individual Therapy   Excell Seltzer, PT, DPT  06/15/2020, 12:25 PM

## 2020-06-15 NOTE — Progress Notes (Signed)
Constantly turning in bed. Rambling conversation, occasionally has an appropriate phrase or word. Unable to redirect. Bilateral mittens in place. Biting at mittens. ? Related to lip bleeding, intermittently. Patient received 10mg 's of Oxy IR at 2202. Dr. Letta Pate paged, seroquel given at 2230. Minimal to no relief with seroquel, continues to toss and turn in bed. At 0001, PRN robaxin and tylenol given. Continues to be in perpetual motion. Telesitter in place to alert staff. While changing patient, she bit this RN. Might need to consider enclosure bed. At [place], paged Dr. Letta Pate R/T BP and HR elevated, ? Accuracy because of constantly moving. Order for ativan via tube, med given at 0032, no change in patients behavior. At times seems to grimace when RUE is touched. Splint with ace wrap in place. PRN Oxy IR given at 0359. [name] A

## 2020-06-16 ENCOUNTER — Ambulatory Visit (HOSPITAL_COMMUNITY): Payer: Medicaid Other

## 2020-06-16 ENCOUNTER — Encounter (HOSPITAL_COMMUNITY): Payer: Medicaid Other | Admitting: Psychology

## 2020-06-16 ENCOUNTER — Inpatient Hospital Stay (HOSPITAL_COMMUNITY): Payer: Medicaid Other | Admitting: Speech Pathology

## 2020-06-16 ENCOUNTER — Inpatient Hospital Stay (HOSPITAL_COMMUNITY): Payer: Medicaid Other | Admitting: Occupational Therapy

## 2020-06-16 LAB — GLUCOSE, CAPILLARY
Glucose-Capillary: 129 mg/dL — ABNORMAL HIGH (ref 70–99)
Glucose-Capillary: 132 mg/dL — ABNORMAL HIGH (ref 70–99)
Glucose-Capillary: 158 mg/dL — ABNORMAL HIGH (ref 70–99)
Glucose-Capillary: 76 mg/dL (ref 70–99)
Glucose-Capillary: 86 mg/dL (ref 70–99)
Glucose-Capillary: 96 mg/dL (ref 70–99)

## 2020-06-16 MED ORDER — QUETIAPINE FUMARATE 50 MG PO TABS
100.0000 mg | ORAL_TABLET | Freq: Every day | ORAL | Status: DC
  Administered 2020-06-16 – 2020-07-08 (×23): 100 mg
  Filled 2020-06-16 (×24): qty 2

## 2020-06-16 MED ORDER — METHYLPHENIDATE HCL 5 MG PO TABS
5.0000 mg | ORAL_TABLET | Freq: Two times a day (BID) | ORAL | Status: DC
  Administered 2020-06-16 – 2020-06-25 (×19): 5 mg
  Filled 2020-06-16 (×19): qty 1

## 2020-06-16 NOTE — Patient Care Conference (Signed)
Inpatient RehabilitationTeam Conference and Plan of Care Update Date: 06/16/2020   Time: 10:43 AM    Patient Name: Beth [name]      Medical Record Number: [id number]  Date of Birth: 22-Nov-1985 Sex: Female         Room/Bed: 4W14C/4W14C-01 Payor Info: Payor: [place] / Plan: BCBS MEDICAID ADVANTAGE / Product Type: *No Product type* /    Admit Date/Time:  06/11/2020  8:36 PM  Primary Diagnosis:  TBI (traumatic brain injury) [facility])  Hospital Problems: Principal Problem:   TBI (traumatic brain injury) [facility])    Expected Discharge Date: Expected Discharge Date:  (4 Weeks)  Team Members Present: Physician leading conference: Dr. Alger Simons Care Coodinator Present: Erlene Quan, BSW;Amro Winebarger Creig Hines, RN, BSN, CRRN Nurse Present: Benjie Karvonen, RN PT Present: Tereasa Coop, PT OT Present: Cherylynn Ridges, OT SLP Present: Weston Anna, SLP PPS Coordinator present : Ileana Ladd, Burna Mortimer, SLP     Current Status/Progress Goal Weekly Team Focus  Bowel/Bladder   incontinent of B/B . Last BM-11/19  a fewer episodes of incontinence  assess B/B Q shift and PRN   Swallow/Nutrition/ Hydration   NPO with PEG  Min A  maximize attention for PO trials of Dys. 1 textures with thin liquids   ADL's   Total  +2  Min/CGA overall  arousal, self-care retraining, transfers, cognition   Mobility   Max to TotalA +2 for bed mobility  MinA  Arousal, cognition, bed mobility, transfers   Communication   Max-Total A-mostly unintelligible with language of confusion  Min A  attending to basic yes/no questions or encourage simple decision making   Safety/Cognition/ Behavioral Observations  [place] Level IV-Total A  Min-Mod A  decrease restlessness, initiation and attention to basic self-care tasks, allow care and provide safe opportunities to allow purposeful behavior   Pain   pain6/10( faces)  pain less<3  assess pain Q shift and PRN   Skin   PEG tube, surgical  scar, excoriation elbow, eccymosis face.  remain skin intack  assess skin  Q shift and PRN     Discharge Planning:  Return to [facility] [place] to home with her fiance and three children. Support from mother and stepfather. Family is currently here in [place].   Team Discussion: Incontinent B/B, skin looks good. OT reports having patient up to the chair, is a total assist to get up. Max assist +2 for transfers. PT reports patient is a max assist +2 for bed mobility. SLP reports patient is restless, has unintelligible speech, is NPO, and needs oral care 4 times per day. Patient on target to meet rehab goals: yes, currently working on patient's sleep/wake cycle, and behavior control.  *See Care Plan and progress notes for long and short-term goals.   Revisions to Treatment Plan:  Not at this time.  Teaching Needs: Begin family education when appropriate.  Current Barriers to Discharge: Inaccessible home environment, Decreased caregiver support, Medical stability, Home enviroment access/layout, Incontinence, Wound care, Lack of/limited family support, Weight bearing restrictions, Medication compliance, Behavior, Nutritional means and sleep/wake cycle.  Possible Resolutions to Barriers: Offer timed toileting schedule, provide quiet environment at HS to induce sleep, educate on weight bearing precautions, offer nutritional supplements, maintain enclosure bed for patient safety, continue current medications, MD will adjust when necessary. Educate wound care and dressing changes, provide emotional support for patient and family.      Medical Summary Current Status: severe traumatic brain injury. working on establishing sleep-wake cycle. often lethargic  during the day, true RLAS IV, PEG  Barriers to Discharge: Medical stability   Possible Resolutions to [employer] Focus: sleep/wake restoration, behavioral control. enclosure bed. daily review of labs and patient data   Continued Need for Acute  Rehabilitation Level of Care: The patient requires daily medical management by a physician with specialized training in physical medicine and rehabilitation for the following reasons: Direction of a multidisciplinary physical rehabilitation program to maximize functional independence : Yes Medical management of patient stability for increased activity during participation in an intensive rehabilitation regime.: Yes Analysis of laboratory values and/or radiology reports with any subsequent need for medication adjustment and/or medical intervention. : Yes   I attest that I was present, lead the team conference, and concur with the assessment and plan of the team.   Cristi Loron 06/16/2020, 2:18 PM
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
## 2020-06-16 NOTE — Progress Notes (Signed)
Physical Therapy Session Note  Patient Details  Name: Beth [name] MRN: [id number] Date of Birth: Aug 17, 1985  Today's Date: 06/16/2020 PT Individual Time: 1335-1415 PT Individual Time Calculation (min): 40 min   Short Term Goals: Week 1:  PT Short Term Goal 1 (Week 1): Patient will participate in meangingful therapy for >5 mins PT Short Term Goal 2 (Week 1): Patient will roll R/L with CGA consistently PT Short Term Goal 3 (Week 1): Patient will transition supine <> sit with MaxA x1 PT Short Term Goal 4 (Week 1): Patient will transfer bed<> wc MaxA x1 with LRAD  Skilled Therapeutic Interventions/Progress Updates:     Patient in enclosure bed with nursing changing her brief upon PT arrival. Patient alert with intermittent lethargy/fatigue with eyes closed, but responsive to therapist when asked to open her eyes.   She followed simple one-step commands today with increased time for initiation. She demonstrated a pian response to L upper/lower extremity, also delayed, but no response to painful stimulus on the L. Followed cues for volitional movement of L first 3 digits and arm and L knee extension in sitting. Required repetitive cues to initiate some volitional knee extension on the R  and unable to demonstrate volitional movement of her R upper extremity in sitting today. Noted what appears to be R neglect with decreased awareness of therapist on R side and decreased head rotation to the R. Can look R with her eyes with mod cues and increased time.  Therapeutic Activity: Bed Mobility: Patient performed rolling R/L with total A +2 to don pants with total A bed level. She performed supine to/from sit with total A +2 x1 and mod-max A +2 on second trial. Provided verbal cues for timing, sequencing throughout. Provided increased time after cues on second trial with improved initiation from the patient. Patient unable to follow cues for weight bearing precautions of B upper extremities due to  cognitive deficits. Patient sat EOB with slumped posture and poor head control with neck flexed. Attempted working on sitting balance with poor participation when first sitting EOB. Patient stated "my stomach hurts" and patient spontaneously initiated returning to R side-lying, required max A for trunk control. Upon sitting up a second time, initiated transfer to TIS w/c quickly to reduce fatigue/pain in sitting, and improve participation.  Transfers: Patient performed squat pivot bed<>TIS w/c with max A +2 x1 and mod A +2 x1. Provided verbal cues for initiation and sequencing, better on second trial due to providing increased time for patient to initiate and motivation of returning to bed. She performed sit to stand x1 from TIS w/c with max A +2, blocking B knees for safety. She stood ~15 seconds before asking if she could sit, followed by spontaneously sitting in the w/c. She required max A +2 for balance with strong posterior bias with decreased hip and knee extension R>L.  Improved slightly with facilitation at hips and posterior facilitation at B tibias.   Patient sat in the TIS w/c >15 min in ~25 deg tilt due to decreased trunk control, performed motor and sensory assessment, see details above. Patient asked where she was, orientated patient to time, location, and situation. Patient attending to information and making eye contact with therapist. RN provided medications and nutrition via PEG with patient in sitting. Patient able to state her children's names and ages and the city she is from. Patient becoming inpatient about getting back to bed as nursing was providing medications. Able to be redirected and waited for RN  to finish without further behaviors. She grabbed the syringe while it was in the PEG to bring it to her mouth like a drink, easily redirected, may be demonstrating increased interest with PO intake. Re-wrapped ACE around R hand splint due to ACE being too tight and pulling her pinky into a  flexed position.   Patient in secured enclosure bed with R mit donned, RN in the room and Telesitter in place at end of session.   Therapy Documentation Precautions:  Precautions Precautions: Fall, Other (comment) Precaution Comments: Lt radial head fx; PIP fx Rt little finger, and MCP fx Rt ring finger; unrestricted ROM LUE per Dr Doreatha Martin note 10/29 Required Braces or Orthoses: Splint/Cast Splint/Cast: ulnar gutter splint (RUE); Lt elbow bean bag splint for elbow extension;  Lt UE NWB; peg Restrictions Weight Bearing Restrictions: Yes RUE Weight Bearing: Non weight bearing LUE Weight Bearing: Non weight bearing   Therapy/Group: Individual Therapy  Beth [name] L Beth [name] PT, DPT  06/16/2020, 4:24 PM

## 2020-06-16 NOTE — Progress Notes (Signed)
Speech Language Pathology Daily Session Note  Patient Details  Name: Beth [name] MRN: [id number] Date of Birth: 05-16-1986  Today's Date: 06/16/2020 SLP Individual Time: 2683-4196 SLP Individual Time Calculation (min): 40 min  Short Term Goals: Week 1: SLP Short Term Goal 1 (Week 1): Patient will demonstrate sustained attention for increments of 45-60 seconds with max A cues. SLP Short Term Goal 2 (Week 1): Patient will participate in PO trials with SLP with maxA. SLP Short Term Goal 3 (Week 1): Patient will respond to basic biographical and immediate environment yes/no questions with 70% accuracy. SLP Short Term Goal 4 (Week 1): Patient will initiate to perform basic level functional tasks (reaching out to pick up cup, etc) at least 3 times in a session with modA cues.  Skilled Therapeutic Interventions: Skilled treatment session focused on cognitive and dysphagia goals. SLP facilitated session by providing total A for oral care via the suction toothbrush with patient able to follow basic commands for cues. Patient required total +2 assist to sit EOB and patient consumed 1 straw sip of thin liquids with subtle throat clearing noted. Patient also self-fed ~3 bites of applesauce with Mod A verbal cues without overt s/s of aspiration. Recommend ongoing trials with SLP only. Patient with decreased restlessness today and attempted to answer most basic yes/no questions with head nods. When attempting to check vocal quality after trials, patient able to verbalize her name X 2. Overall, patient with decreased restlessness and increased ability to follow basic commands and participate in basic, purposeful tasks. Patient left supine in enclosure bed. Continue with current plan of care.      Pain Pain Assessment Pain Scale: Faces Faces Pain Scale: No hurt  Therapy/Group: Individual Therapy  Praveen Coia 06/16/2020, 11:41 AM

## 2020-06-16 NOTE — Consult Note (Signed)
  Patient put in neuropsych schedule but it was a mistake and patient not ready to be seen by me yet.  Beth [name]

## 2020-06-16 NOTE — Progress Notes (Signed)
Pt slept until 4 am. Pt constantly turning in bed, making noises, yelling out from time to time. Very difficult to redirect. Pain management in progress, effective.Tube feeding and free water tolerated well. No acute distress. Pt remains in in enclosure bed, tele sitter in progress.

## 2020-06-16 NOTE — Progress Notes (Signed)
Patient ID: Beth [name], female   DOB: Feb 07, 1986, 35 y.o.   MRN: [id number]  SW called [name] [phone]) to provide updates from team conference, and d/c date ELOS of 4 weeks. SW informed there will continue to be updates.Loralee Pacas, MSW, [place] Office: [phone] Cell: [phone] Fax: [phone]

## 2020-06-16 NOTE — Progress Notes (Signed)
Orthopedic Tech Progress Note Patient Details:  Beth [name] 1985/10/20 [id number] Was called to bring patient another abdominal binder. The one from yesterday was soiled. Ortho Devices Type of Ortho Device: Abdominal binder Ortho Device/Splint Location: stomach Ortho Device/Splint Interventions: Other (comment)   Post Interventions Patient Tolerated: Well Instructions Provided: Care of device   Janit Pagan 06/16/2020, 9:12 AM

## 2020-06-16 NOTE — Progress Notes (Signed)
Occupational Therapy Session Note  Patient Details  Name: Beth [name] MRN: [id number] Date of Birth: Nov 13, 1985  Today's Date: 06/16/2020 OT Individual Time: 0962-8366 OT Individual Time Calculation (min): 53 min   Short Term Goals: Week 1:  OT Short Term Goal 1 (Week 1): Pt will participate 15 mins without attempts to return to supine OT Short Term Goal 2 (Week 1): Pt will engage in bathing with max assist OT Short Term Goal 3 (Week 1): Pt will complete UB dressing with max assist OT Short Term Goal 4 (Week 1): Pt will complete toilet transfer with max assist  Skilled Therapeutic Interventions/Progress Updates:    Pt supine in bed upon OT arrival. Skilled COTX with OT clinical specialist. Pt noted to be restless in bed, but did have some appropriate interactions with OTs initially by responding to salutations. Pt incontinent of urine in bed which had saturated mattress. Worked on rolling with total A to L and R for total A +2 peri-care and brief change. Total A +2 to come to sitting EOB. Pt then able to maintain sitting balance with max A of 1. Pt initiated mobility to try to get to chair, but needed max+2 to complete transfer. Once in wc, pt needed max A to maintain sitting balance as she began scooting hips forward. Total A +2 to change shirt 2/2 saturated with urine. Looked for purposeful movements throughout session, but none were noted. BP 115/86 Pulse: 97.Worked on safe positioning in wc by adding head rest and full lap tray. Pt also needs gait belt to act as seat belt to keep hips back in TIS wc Pt left tilted in wc at the door by the nurses station. Pt too lethargic to participate further in OT session. Nursing aware of patient status and discussed maximove transfer back to bed when ready.  Therapy Documentation Precautions:  Precautions Precautions: Fall, Other (comment) Precaution Comments: Lt radial head fx; PIP fx Rt little finger, and MCP fx Rt ring finger; unrestricted ROM LUE  per Dr Doreatha Martin note 10/29 Required Braces or Orthoses: Splint/Cast Splint/Cast: ulnar gutter splint (RUE); Lt elbow bean bag splint for elbow extension;  Lt UE NWB; peg Restrictions Weight Bearing Restrictions: Yes RUE Weight Bearing: Non weight bearing LUE Weight Bearing: Non weight bearing Pain: Pain Assessment Pain Scale: Faces Pain Score: Asleep Faces Pain Scale: No hurt   Therapy/Group: Individual Therapy  Valma Cava 06/16/2020, 9:06 AM

## 2020-06-17 ENCOUNTER — Inpatient Hospital Stay (HOSPITAL_COMMUNITY): Payer: Medicaid Other

## 2020-06-17 ENCOUNTER — Inpatient Hospital Stay (HOSPITAL_COMMUNITY): Payer: Medicaid Other | Admitting: Physical Therapy

## 2020-06-17 LAB — GLUCOSE, CAPILLARY
Glucose-Capillary: 63 mg/dL — ABNORMAL LOW (ref 70–99)
Glucose-Capillary: 71 mg/dL (ref 70–99)
Glucose-Capillary: 79 mg/dL (ref 70–99)
Glucose-Capillary: 84 mg/dL (ref 70–99)
Glucose-Capillary: 92 mg/dL (ref 70–99)
Glucose-Capillary: 95 mg/dL (ref 70–99)

## 2020-06-17 NOTE — Progress Notes (Signed)
Occupational Therapy Session Note  Patient Details  Name: Beth [name] MRN: [id number] Date of Birth: 06-02-1986  Today's Date: 06/17/2020 OT Individual Time: 1300-1340 OT Individual Time Calculation (min): 40 min    Short Term Goals: Week 1:  OT Short Term Goal 1 (Week 1): Pt will participate 15 mins without attempts to return to supine OT Short Term Goal 2 (Week 1): Pt will engage in bathing with max assist OT Short Term Goal 3 (Week 1): Pt will complete UB dressing with max assist OT Short Term Goal 4 (Week 1): Pt will complete toilet transfer with max assist  Skilled Therapeutic Interventions/Progress Updates:    Pt asleep in enclosure bed, agreeable throughout session to passive hand positioning of right hand as needed for orthosis adjustment.  OT noted significant redness along thenar border with small scab superior portion of thenar border.  Per PT, orthosis was taken off by PT during morning session due to concerns of skin integrity. Upon OT arriving pt had ace wrap partially donned around wrist.  Pt required total dependence to doff ace wrap.  OT modified current custom forearm based ulnar gutter to eliminate pressure area over thenar border to improve skin integrity and pt tolerance.  Also modified orthosis to allow for ring finger PIP joint flexion and reduce pressure area at this spot as well.  Applied new straps for improved support.  Pt appearing to tolerate orthosis well when re-donned.  Hand mit donned to prevent pt self doffing orthosis.  Pt asleep in bed, enclosure secured, bed alarm on, telesitter on.    Therapy Documentation Precautions:  Precautions Precautions: Fall, Other (comment) Precaution Comments: Lt radial head fx; PIP fx Rt little finger, and MCP fx Rt ring finger; unrestricted ROM LUE per Dr Doreatha Martin note 10/29 Required Braces or Orthoses: Splint/Cast Splint/Cast: ulnar gutter splint (RUE); Lt elbow bean bag splint for elbow extension;  Lt UE NWB;  peg Restrictions Weight Bearing Restrictions: Yes RUE Weight Bearing: Non weight bearing LUE Weight Bearing: Non weight bearing   Therapy/Group: Individual Therapy  Beth [name] 06/17/2020, 3:18 PM
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
## 2020-06-17 NOTE — Progress Notes (Signed)
Physical Therapy Session Note  Patient Details  Name: Beth [name] MRN: [id number] Date of Birth: [birth year]  Today's Date: 06/17/2020 PT Individual Time: 5009-3818 PT Individual Time Calculation (min): 69 min   Short Term Goals: Week 1:  PT Short Term Goal 1 (Week 1): Patient will participate in meangingful therapy for >5 mins PT Short Term Goal 2 (Week 1): Patient will roll R/L with CGA consistently PT Short Term Goal 3 (Week 1): Patient will transition supine <> sit with MaxA x1 PT Short Term Goal 4 (Week 1): Patient will transfer bed<> wc MaxA x1 with LRAD  Skilled Therapeutic Interventions/Progress Updates:     Pt received in [place] bed and agreeable to therapy. Reports no pain but at times biting at ace bandage on R wrist and hand. Easily redirectable and OT planning to provide new splint later in day. Supine to sit with modA. While seated EOB, pt impulsively lays back down and allowed to rest for several minutes. Supine to sit additional time with modA. Pt performs stand pivot transfer to tilt-in-space WC with modA +1 and pt providing good initiation for sit to stand. Pt sits in tilt in space with PT providing close supervision and providing redirection when pt moves restlessly in [place]. PT provides warm washcloth and pt able to wipe face with R hand.  WC transport to ortho gym for time management. Gait training performed with maxA +2 and +3 for WC follow. Pt ambulates with 3 musketeer technique, with PT blocking L knee and providing maxA for progress of L leg. Pt able to progress R leg with verbal cueing, but R stride length initially very short due to lack of lateral weight shift to the L. Pt ambulates 15' on initial attempt. Following extended seated rest break pt ambulates additional 20' with mirror positioned for visual feedback. R stride length improves with swing through gait pattern and increased facilitation of L lateral weight shift, and increased blocking of L knee for stability.    PT readjusts pt's foot rests for improved ergonomics. Following rest break, pt transfers to mat table with modA +2 and squat pivot technique. NMR provided for sitting balance with pt able to attend to task for 1-2 minutes, demonstrating restlessness and tendency to lean toward R. Pt then impulsively lays posteriorly and rests in supine with knees in hooklying position. Pt assisted to seated position with modA +2 and squat pivot from mat>WC>bed with modA +2. Pt left supine in posey bed.   Therapy Documentation Precautions:  Precautions Precautions: Fall, Other (comment) Precaution Comments: Lt radial head fx; PIP fx Rt little finger, and MCP fx Rt ring finger; unrestricted ROM LUE per Dr Doreatha Martin note 10/29 Required Braces or Orthoses: Splint/Cast Splint/Cast: ulnar gutter splint (RUE); Lt elbow bean bag splint for elbow extension;  Lt UE NWB; peg Restrictions Weight Bearing Restrictions: Yes RUE Weight Bearing: Non weight bearing LUE Weight Bearing: Non weight bearing  Therapy/Group: Individual Therapy  Breck Coons, PT, DPT 06/17/2020, 12:29 PM
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
## 2020-06-17 NOTE — Progress Notes (Addendum)
Physical Therapy Session Note  Patient Details  Name: Beth [name] MRN: [id number] Date of Birth: [birth year]  Today's Date: 06/17/2020 PT Individual Time: 0800-0945 PT Individual Time Calculation (min): 105 min   Short Term Goals: Week 1:  PT Short Term Goal 1 (Week 1): Patient will participate in meangingful therapy for >5 mins PT Short Term Goal 2 (Week 1): Patient will roll R/L with CGA consistently PT Short Term Goal 3 (Week 1): Patient will transition supine <> sit with MaxA x1 PT Short Term Goal 4 (Week 1): Patient will transfer bed<> wc MaxA x1 with LRAD  Skilled Therapeutic Interventions/Progress Updates:     Patient in enclosure bed upon PT arrival. Patient with increased alertness and asking "Can you get me out of here?" when PT entered the room. Patient followed one step commands throughout session, noted mild grimacing with management of L upper extremity, continues to demonstrate L side neglect and hemiplegia upper>lower extremity, and noted to have increased arousal, initiation, and impulsivity this session.    Patient incontinent of bladder upon PT arrival. Patient's brief, clothing, and bed soiled, RN made aware and discussed timed toileting and fluid intake to reduce reoccurrence.   Therapeutic Activity: Bed Mobility: Patient performed supine to/from sit with mod-min of 1-2. Provided verbal cues for initiation, and bringing her legs off the bed before sitting up. Patient tends to bring her legs off then put them back on the bed.  Bathing/Dressing: Doffed/donned pants, shirt, incontinence brief, performed peri-care and upper body bathing at the sink with max-total A. Patient followed simple cues to place her R upper extremity into the shirt and B lower extremities into pants. Patient also participated with hand over hand assist to wash her face at the sink.  Transfers: Patient performed squat pivot bed<>TIS w/c with mod A +2 for safety with improved patient initiation this  session. Provided cues for timing and sequencing. She performed sit to/from stand x1 with mod-max A +2 using 3 musketeer technique to avoid weight bearing and for improved trunk control. Provided verbal cues for initiation, blocked B knees to prevent buckling, required block on L due to decreased attention/strength on L.   Gait Training:  Patient ambulated 8 feet using 3 musketeer technique to avoid weight bearing through upper extremities  with max A +2 and total A for L leg advancement, foot placement, and blocking R knee in stance. Ambulated with increased L trunk flexion, decreased L knee and hip extension in stance, and decreased L weight shift with spontaneous stepping with R before shifting L. Provided verbal cues for sequencing throughout and manual facilitation for weight shifting, attention to L leg, and safety, as patient stated, "I need to sit" x2 with increased frustration the second time and initiated sitting before she was at the chair, requiring total A to turn and sit at the end.  Wheelchair Mobility:  Patient was transported in the w/c with total A throughout session for energy conservation and time management. Cleaned new TIS for patient during session. Adjusted leg rests and changed out and adjusted the head rest for the patient. Required increased time due to complexity of chair parts. Patient tolerated sitting in TIS w/c >30 min during adjustments with cues for L attention and orientation questions. Patient unable to answer any orientation questions correctly. Reoriented patient during session.   RN came in to provide patient medications and nutritional supplement. Removed ACE wrap from patient's R hand to assess hand splint. Noted redness and skin breakdown with blistering along  patients palm and around patient's thumb from splint and ACE wrap. Also noted the splint and patient's hand were very dirty. Cleaned patient's hand and splint and reapplied new ACE wrap without splint due to  skin breakdown. RN and Dr. Ranell Patrick made aware, Horris Latino OT made aware as well and planning to adjust or remake the splint this afternoon.   Neuromuscular Re-ed: Patient performed the following sitting balance and L attention activities: -sitting EOB working on midline orientation x3 min, patient with mild L lean and decreased trunk control with slumped posture, able to self correct today with cues, but would return to leaning on therapist for support  -seated in TIS performed sensitization and gentle PROM of L upper extremity for increased attention and range, continues to have hard stop at end range, continues to lack any muscle activation with cues, noted mild synergistic flexor tone when patient yawns -seated in TIS reaching with R hand to visual targets, following cues 100% of the time with increased time for initiation and reaching target >50% of the time, undershooting due to fatigue  Patient in secured enclosure bed with Telesitter in place at end of session with breaks locked and all needs within reach.   Therapy Documentation Precautions:  Precautions Precautions: Fall, Other (comment) Precaution Comments: Lt radial head fx; PIP fx Rt little finger, and MCP fx Rt ring finger; unrestricted ROM LUE per Dr Doreatha Martin note 10/29 Required Braces or Orthoses: Splint/Cast Splint/Cast: ulnar gutter splint (RUE); Lt elbow bean bag splint for elbow extension;  Lt UE NWB; peg Restrictions Weight Bearing Restrictions: Yes RUE Weight Bearing: Non weight bearing LUE Weight Bearing: Non weight bearing   Therapy/Group: Individual Therapy  Jonel Weldon L Symphony Demuro PT, DPT  06/17/2020, 12:23 PM

## 2020-06-17 NOTE — Progress Notes (Addendum)
Patient with bruises under left thumb as well as pressure area on palmer surface with blister at base of thumb. Dr. Fredna Dow contacted about bruises and blistering on patients left hand. He ordered follow up X rays and recommended brace adjustment by OT. Foam dressings placed for padding/pressure relief. He will follow up with patient a next few days.

## 2020-06-18 DIAGNOSIS — Z931 Gastrostomy status: Secondary | ICD-10-CM

## 2020-06-18 DIAGNOSIS — F981 Encopresis not due to a substance or known physiological condition: Secondary | ICD-10-CM

## 2020-06-18 DIAGNOSIS — R000 Tachycardia, unspecified: Secondary | ICD-10-CM

## 2020-06-18 DIAGNOSIS — T07XXXA Unspecified multiple injuries, initial encounter: Secondary | ICD-10-CM

## 2020-06-18 DIAGNOSIS — S069X4D Unspecified intracranial injury with loss of consciousness of 6 hours to 24 hours, subsequent encounter: Secondary | ICD-10-CM

## 2020-06-18 DIAGNOSIS — G479 Sleep disorder, unspecified: Secondary | ICD-10-CM

## 2020-06-18 LAB — GLUCOSE, CAPILLARY
Glucose-Capillary: 108 mg/dL — ABNORMAL HIGH (ref 70–99)
Glucose-Capillary: 114 mg/dL — ABNORMAL HIGH (ref 70–99)
Glucose-Capillary: 154 mg/dL — ABNORMAL HIGH (ref 70–99)
Glucose-Capillary: 77 mg/dL (ref 70–99)
Glucose-Capillary: 78 mg/dL (ref 70–99)
Glucose-Capillary: 80 mg/dL (ref 70–99)
Glucose-Capillary: 86 mg/dL (ref 70–99)

## 2020-06-18 MED ORDER — MELATONIN 3 MG PO TABS
1.5000 mg | ORAL_TABLET | Freq: Every day | ORAL | Status: DC
  Administered 2020-06-18: 1.5 mg via ORAL
  Filled 2020-06-18: qty 1

## 2020-06-18 NOTE — Progress Notes (Signed)
Call from [facility] mother, checking in on status of member. RN verbalized overall calm demeanor, with plan to administer 2200 meds as scheduled and check for continence. Questions addressed. No further questions at present.
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
## 2020-06-19 ENCOUNTER — Inpatient Hospital Stay (HOSPITAL_COMMUNITY): Payer: Medicaid Other

## 2020-06-19 ENCOUNTER — Inpatient Hospital Stay (HOSPITAL_COMMUNITY): Payer: Medicaid Other | Admitting: Speech Pathology

## 2020-06-19 ENCOUNTER — Inpatient Hospital Stay (HOSPITAL_COMMUNITY): Payer: Medicaid Other | Admitting: Occupational Therapy

## 2020-06-19 DIAGNOSIS — D62 Acute posthemorrhagic anemia: Secondary | ICD-10-CM

## 2020-06-19 LAB — GLUCOSE, CAPILLARY
Glucose-Capillary: 85 mg/dL (ref 70–99)
Glucose-Capillary: 89 mg/dL (ref 70–99)
Glucose-Capillary: 78 mg/dL (ref 70–99)
Glucose-Capillary: 95 mg/dL (ref 70–99)
Glucose-Capillary: 82 mg/dL (ref 70–99)
Glucose-Capillary: 158 mg/dL — ABNORMAL HIGH (ref 70–99)

## 2020-06-19 MED ORDER — MELATONIN 3 MG PO TABS
3.0000 mg | ORAL_TABLET | Freq: Every day | ORAL | Status: DC
  Administered 2020-06-19: 3 mg via ORAL
  Filled 2020-06-19: qty 1

## 2020-06-19 MED ORDER — IOHEXOL 300 MG/ML  SOLN
50.0000 mL | Freq: Once | INTRAMUSCULAR | Status: AC | PRN
  Administered 2020-06-19: 50 mL

## 2020-06-19 MED ORDER — MELATONIN 3 MG PO TABS
3.0000 mg | ORAL_TABLET | Freq: Every day | ORAL | Status: DC
  Administered 2020-06-20 – 2020-07-08 (×19): 3 mg
  Filled 2020-06-19 (×19): qty 1

## 2020-06-19 NOTE — Progress Notes (Signed)
Orthopedic Tech Progress Note Patient Details:  Beth [name] 11/29/1985 [id number] Called in order to [employer] for a RESTING HAND SPLINT Patient ID: Beth [name], female   DOB: 11/19/1985, 34 y.o.   MRN: [id number]   Janit Pagan 06/19/2020, 12:23 PM

## 2020-06-19 NOTE — Progress Notes (Signed)
Physical Therapy Weekly Progress Note  Patient Details  Name: Beth [name] MRN: [id number] Date of Birth: 09/07/1985  Beginning of progress report period: June 12, 2020 End of progress report period: June 19, 2020  Today's Date: 06/19/2020 PT Individual Time: 0900-1000 PT Individual Time Calculation (min): 60 min   Patient has met 2 of 3 short term goals.  Patient will good progress with improved arousal, ability to follow one-step commands, participation on therapeutic activities, verbal communication, and tolerance of therapy schedule throughout this week. Patient continues to present with decreased awareness, activity tolerance, attention, poor self monitoring and safety awareness, decreased orientation, L inattention, and restlessness with mild agitation. She currently performs bed mobility and transfers with max-mod A of 1-2 assist, sit to stand with mod A +2, and gait up to 25 ft with max A +2 3 Musketeer technique to maintain upper extremity weight bearing and provide increased trunk control.   Patient continues to demonstrate the following deficits muscle weakness and muscle joint tightness, decreased cardiorespiratoy endurance, impaired timing and sequencing, abnormal tone, decreased coordination and decreased motor planning, decreased midline orientation, decreased attention to left, left side neglect and decreased motor planning, decreased initiation, decreased attention, decreased awareness, decreased problem solving, decreased safety awareness, decreased memory and delayed processing and decreased sitting balance, decreased standing balance, decreased postural control, hemiplegia, decreased balance strategies and difficulty maintaining precautions and therefore will continue to benefit from skilled PT intervention to increase functional independence with mobility.  Patient progressing toward long term goals..  Continue plan of care.  PT Short Term Goals Week 1:  PT Short  Term Goal 1 (Week 1): Patient will participate in meangingful therapy for >5 mins PT Short Term Goal 1 - Progress (Week 1): Met PT Short Term Goal 2 (Week 1): Patient will roll R/L with CGA consistently PT Short Term Goal 2 - Progress (Week 1): Progressing toward goal PT Short Term Goal 3 (Week 1): Patient will transition supine <> sit with MaxA x1 PT Short Term Goal 3 - Progress (Week 1): Met PT Short Term Goal 4 (Week 1): Patient will transfer bed<> wc MaxA x1 with LRAD PT Short Term Goal 4 - Progress (Week 1): Partly met (Inconsitent max A +1 to mod A +2) Week 2:  PT Short Term Goal 1 (Week 2): Patient will perform bed mobility with mod A consistently. PT Short Term Goal 2 (Week 2): Patient will participate in functional activities >10 min intervals during therapy sessions. PT Short Term Goal 3 (Week 2): Patient will perform transfers bed<>w/c with mod A consistently. PT Short Term Goal 4 (Week 2): Patient will ambulate >50 ft with max +2 assist.  Skilled Therapeutic Interventions/Progress Updates:     Patient in enclosure upon PT arrival. Patient lethargic and asking to sleep at beginning of session. Remained intermittently lethargic throughout session, improved with mobility, and patient able to remain engaged when provided seated and lying rest breaks between activities.  Therapeutic Activity: Bed Mobility: Patient performed supine to/from sit with mod-max A of 1 with a second person SBA for safety in the enclosure bed and on a mat table. Provided verbal cues for sequencing and facilitation for trunk and lower extremity management throughout. Sitting EOB with max-min A for sitting balance, patient doffed a hospital gown with total A, donned a shirt with max A, initiated placing R arm in the shirt and bringing the shirt overhead, donned socks and pants with max-total A.  Transfers: Patient performed stand pivot bed>TIS with max A +  2 and squat pivot TIS>bed and TIS>mat table with mod-max A +2  with fluctuation of patient initiation during transfers. She performed sit to/from stand x2 with mod A +2 blocking her L knee to prevent buckling with 3 musketeer technique for improved trunk support and to maintain upper extremity weight bearing precautions. Provided verbal cues for timing, sequencing, hip/trink and L knee extension, and controlled descent for safety.  Gait Training:  Patient ambulated 25 feet x2 using 3 Musketeer technique with max A +2 and total A progressing to mod A for L limb advancement and max A progressing to min A for R limb advancement due to decreased initiation and L inattention. Ambulated with increased trunk flexion with decreased trunk control, B knee flexion L>R, and decreased initiation that improved during second trial. Provided multimodal cues for trunk/head extension, sequencing, initiation, weight shifting, and reciprocal stepping. Provided pictures of family on back of tall mirror in front of her for increased motivation, improved initiation with cues to ambulate to her son.   Wheelchair Mobility:  Patient was transported in the w/c with total A throughout session for energy conservation and time management.  Neuromuscular Re-ed: Patient performed the following activities seated in TIS or sitting EOB focused on purposeful initiation, L attention, midline orientation, cognitive remediation: -sitting in TIS and EOM with max-min A for sitting balance patient looking at pictures of family on her L, patient initially not verbally responsive, but nodding "yes" "no" appropriately to questions about the people in the pictures, eventually verbalized some simple 1-3 word phrases about her son -patient handed a medium sized, mesh ball back and forth to therapist on her L with min cues, only used her R hand despite cues and hand-over-hand assist to use her L, performed task both in TIS w/c and sitting EOM with mod A-CGA for sitting balance  Patient seated in TIS with RN in  the room to provide patient's meds at end of session with breaks locked, gait belt around patient's waist to reduce sacral sitting and sliding out of the chair for safety.   Therapy Documentation Precautions:  Precautions Precautions: Fall, Other (comment) Precaution Comments: Lt radial head fx; PIP fx Rt little finger, and MCP fx Rt ring finger; unrestricted ROM LUE per Dr Doreatha Martin note 10/29 Required Braces or Orthoses: Splint/Cast Splint/Cast: ulnar gutter splint (RUE); Lt elbow bean bag splint for elbow extension;  Lt UE NWB; peg Restrictions Weight Bearing Restrictions: Yes RUE Weight Bearing: Non weight bearing LUE Weight Bearing: Non weight bearing  Therapy/Group: Individual Therapy  Ladaija Dimino L Ezrael Sam PT, DPT  06/19/2020, 4:01 PM

## 2020-06-19 NOTE — Progress Notes (Addendum)
Occupational Therapy Weekly Progress Note  Patient Details  Name: Tricha Ruggirello MRN: [id number] Date of Birth: 08/24/1985  Beginning of progress report period: June 12, 2020 End of progress report period: June 19, 2020  Today's Date: 06/19/2020 OT Individual Time: 4098-1191 OT Individual Time Calculation (min): 86 min    Patient has met 1 of 4 short term goals.  Pt is making slow, but steady progress towards her OT goals. Pt has been limited at times 2/2 lethargy as she is a [place] III, emerging into [place] IV towards the end of the week where she has been exhibiting much more purposeful behaviors. Pt continues to wax and wane with alertness, but is overall much more alert the past few days. Pt's restlessness continues, but is also getting better. Pt can transfer with as little as max A of 1, but still needs Max+2 most of the time for safety. Continue current POC.   Patient continues to demonstrate the following deficits: muscle weakness and muscle joint tightness, impaired timing and sequencing, abnormal tone, unbalanced muscle activation, ataxia, decreased coordination and decreased motor planning, decreased midline orientation, decreased attention to right and decreased motor planning, decreased initiation, decreased attention, decreased awareness, decreased problem solving, decreased safety awareness, decreased memory and delayed processing and decreased sitting balance, decreased standing balance, decreased postural control, hemiplegia and decreased balance strategies and therefore will continue to benefit from skilled OT intervention to enhance overall performance with BADL and Reduce care partner burden.  Patient progressing toward long term goals..  Continue plan of care.  OT Short Term Goals Week 1:  OT Short Term Goal 1 (Week 1): Pt will participate 15 mins without attempts to return to supine OT Short Term Goal 1 - Progress (Week 1): Met OT Short Term Goal 2 (Week 1): Pt  will engage in bathing with max assist OT Short Term Goal 2 - Progress (Week 1): Progressing toward goal OT Short Term Goal 3 (Week 1): Pt will complete UB dressing with max assist OT Short Term Goal 3 - Progress (Week 1): Progressing toward goal OT Short Term Goal 4 (Week 1): Pt will complete toilet transfer with max assist OT Short Term Goal 4 - Progress (Week 1): Progressing toward goal Week 2:  OT Short Term Goal 1 (Week 2): Pt will consistently participate in 3 grooming tasks during one therapy session OT Short Term Goal 2 (Week 2): Pt will complete toilet transfer with max A of 1 person OT Short Term Goal 3 (Week 2): maintain sitting balance for 3 minutes with mod A of 1  Skilled Therapeutic Interventions/Progress Updates:    Pt greeted seated in TIS wc, more awake and shook head "yes" to participating in OT session. OT brought pt to the sink in TIS wc and handed her a wash cloth. Pt able to follow commands to wash her face appropriately. OT then set-up wall suction oral care and pt able to perform her own oral care with only cues for task termination. Pt completed toilet transfer to raised commode over toilet with max A of 2. Pt needed +2 for clothing management and to wait for clothing to be down prior to sitting. Pt unable to attend to task enough to actually void. She needed mod/max A for sitting balance while seated on commode as well. At one point, she lifted her R LE to place on commode seat and needed max cues for safety. Pt unable to void and needed +2 for clothing management after attempting. Pt brought down  to therapy gym and worked on visual scanning with BITS. Pt needed max cues initially to push large circles. Pt was able to attend to three buttons at a time after practice and min cues. Pt needed multiple rest breaks 2/2 fatigue. OT provided gentle ROM to L elbow, with elbow contracted at 50. OT also provided ROM to L hand. OT removed R hand gutter splint and checked skin under splint.  OT used heat gun to adjust one more area of pressure and added foam dressing back to protect skin from previous pressure injury. Pt pivoted back to bed with max A +2 and left semi-reclined in bed with needs met.   Therapy Documentation Precautions:  Precautions Precautions: Fall, Other (comment) Precaution Comments: Lt radial head fx; PIP fx Rt little finger, and MCP fx Rt ring finger; unrestricted ROM LUE per Dr Doreatha Martin note 10/29 Required Braces or Orthoses: Splint/Cast Splint/Cast: ulnar gutter splint (RUE); Lt elbow bean bag splint for elbow extension;  Lt UE NWB; peg Restrictions Weight Bearing Restrictions: Yes RUE Weight Bearing: Non weight bearing LUE Weight Bearing: Non weight bearing Pain: Patient winces with ROM of L shoulder/elbow/hand. Rest and repositioned for comfort.  Therapy/Group: Individual Therapy  Valma Cava 06/19/2020, 12:59 PM

## 2020-06-19 NOTE — Progress Notes (Signed)
Speech Language Pathology Daily Session Note  Patient Details  Name: Latayvia Mandujano MRN: [id number] Date of Birth: 1986-06-20  Today's Date: 06/19/2020 SLP Individual Time: 1510-1530 SLP Individual Time Calculation (min): 20 min  Short Term Goals: Week 2: SLP Short Term Goal 1 (Week 2): Patient will consume trials of Dys. 1 textures and thin liquids with minimal overt s/s of aspiration over 3 sessions to asses readiness for MBS. SLP Short Term Goal 2 (Week 2): Patient will demonstrate sustained attention to a functional task for ~2 minutes with Max verbal and tactile cues for redirection. SLP Short Term Goal 3 (Week 2): Patient will answer basic yes/no questions in regards to wants/needs in 50% of opportunities. SLP Short Term Goal 4 (Week 2): Patient will participate in 2 functional tasks within a session in 50% of opportunities with Max verbal cues. SLP Short Term Goal 5 (Week 2): Patient will follow basic commands in 50% of opportunities with Max verbal cues.  Skilled Therapeutic Interventions:   Patient seen in room for skilled ST session focusing on basic level cognitive function. Patient very restless and became increasingly more agitated. She allowed for SLP to put vaseline on her lips as they are very dry. She responded appropriately to questions about family members with SLP showing her photos that are in her room. She replied "no" incorrectly when shown photos of her son and fiance, but not with her daughter. She responded to question "how old is your daughter?" saying "54" (seems accurate based on photo). Patient then started to repeat that "in trouble" and would request SLP take off her restraint mitt on hand. She would call out to people who were not present and could not be redirected away from this behavior. Patient continues to benefit from skilled SLP intervention to maximize cognitive-linguistic and swallow function prior to discharge.  Pain Pain Assessment Pain Scale:  Faces Faces Pain Scale: No hurt  Therapy/Group: Individual Therapy   Sonia Baller, MA, CCC-SLP Speech Therapy

## 2020-06-19 NOTE — Progress Notes (Signed)
Physical Therapy Session Note  Patient Details  Name: Beth [name] MRN: [id number] Date of Birth: [birth year]  Today's Date: 06/19/2020 PT Individual Time: 6433-2951 PT Individual Time Calculation (min): 33 min   Short Term Goals: Week 1:  PT Short Term Goal 1 (Week 1): Patient will participate in meangingful therapy for >5 mins PT Short Term Goal 1 - Progress (Week 1): Met PT Short Term Goal 2 (Week 1): Patient will roll R/L with CGA consistently PT Short Term Goal 2 - Progress (Week 1): Progressing toward goal PT Short Term Goal 3 (Week 1): Patient will transition supine <> sit with MaxA x1 PT Short Term Goal 3 - Progress (Week 1): Met PT Short Term Goal 4 (Week 1): Patient will transfer bed<> wc MaxA x1 with LRAD PT Short Term Goal 4 - Progress (Week 1): Partly met (Inconsitent max A +1 to mod A +2)  Skilled Therapeutic Interventions/Progress Updates:   Handoff from ST at end of her session due to pt's bed being still wet from being cleaned and pt up in TIS w/c becoming increasingly restless and wanting to remove lap tray. Removed lap tray for safety and to attempt to calm her down by changing scenery and using calming voice to redirect - pt kept asking about her grandma and "help me." taking her around the unit in the w/c increased restlessness, so just stayed near nurses station and then in her room to allow bed to dry, redirecting as able. Discussed with RN about plan for KUB (need for a regular bed) and safety recommendations. Once pt saw enclosure bed set up for her with blankets and pillow, initiated transferring impulsively to the bed with +2 assist for safety as pt went into sidelying position. Noted to have soiled pants and brief soaked in urine. +2 assist for changing of brief and clothing and total assist for hygiene. Pt calmed down once in the bed, closing eyes and allowed mitt to be placed on R wrist to protect splint. Enclosure bed secured.  Therapy Documentation Precautions:   Precautions Precautions: Fall, Other (comment) Precaution Comments: Lt radial head fx; PIP fx Rt little finger, and MCP fx Rt ring finger; unrestricted ROM LUE per Dr Doreatha Martin note 10/29 Required Braces or Orthoses: Splint/Cast Splint/Cast: ulnar gutter splint (RUE); Lt elbow bean bag splint for elbow extension;  Lt UE NWB; peg Restrictions Weight Bearing Restrictions: Yes RUE Weight Bearing: Non weight bearing LUE Weight Bearing: Non weight bearing   Pain:  Does not appear to have pain. Biting at splint/ACE wrap on R wrist at times. Mitt applied to wrist at end of session.   Therapy/Group: Individual Therapy  Canary Brim Ivory Broad, PT, DPT, CBIS  06/19/2020, 2:45 PM
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
## 2020-06-19 NOTE — Progress Notes (Signed)
Speech Language Pathology Weekly Progress and Session Note  Patient Details  Name: Beth [name] MRN: [id number] Date of Birth: 02/09/1986  Beginning of progress report period: June 11, 2020 End of progress report period: June 19, 2020  Today's Date: 06/19/2020 SLP Individual Time: 1300-1400 SLP Individual Time Calculation (min): 60 min  Short Term Goals: Week 1: SLP Short Term Goal 1 (Week 1): Patient will demonstrate sustained attention for increments of 45-60 seconds with max A cues. SLP Short Term Goal 1 - Progress (Week 1): Met SLP Short Term Goal 2 (Week 1): Patient will participate in PO trials with SLP with maxA. SLP Short Term Goal 2 - Progress (Week 1): Met SLP Short Term Goal 3 (Week 1): Patient will respond to basic biographical and immediate environment yes/no questions with 70% accuracy. SLP Short Term Goal 3 - Progress (Week 1): Not met SLP Short Term Goal 4 (Week 1): Patient will initiate to perform basic level functional tasks (reaching out to pick up cup, etc) at least 3 times in a session with modA cues. SLP Short Term Goal 4 - Progress (Week 1): Met    New Short Term Goals: Week 2: SLP Short Term Goal 1 (Week 2): Patient will consume trials of Dys. 1 textures and thin liquids with minimal overt s/s of aspiration over 3 sessions to asses readiness for MBS. SLP Short Term Goal 2 (Week 2): Patient will demonstrate sustained attention to a functional task for ~2 minutes with Max verbal and tactile cues for redirection. SLP Short Term Goal 3 (Week 2): Patient will answer basic yes/no questions in regards to wants/needs in 50% of opportunities. SLP Short Term Goal 4 (Week 2): Patient will participate in 2 functional tasks within a session in 50% of opportunities with Max verbal cues. SLP Short Term Goal 5 (Week 2): Patient will follow basic commands in 50% of opportunities with Max verbal cues.  Weekly Progress Updates: Patient has made some functional gains and  has met 3 of 4 STGs this reporting period. Currently, patient demonstrates behaviors consistent with a Rancho Level IV and requires overall Max-Total A multimodal cues for focused attention, following basic 1-step commands, and orientation. Patient demonstrates a mild improvement in restlessness with increased ability to focus attention to participate in very basic tasks like oral care and self-feeding of trials. Patient is consuming trials of Dys. 1 textures with thin liquids with intermittent overt s/s of aspiration, however, patient is not appropriate for an objective swallow assessment due to her current cognitive functioning. Patient and family education ongoing. Patient would benefit from continued skilled SLP intervention to maximize her cognitive and swallowing function and overall functional independence prior to discharge.      Intensity: Minumum of 1-2 x/day, 30 to 90 minutes Frequency: 3 to 5 out of 7 days Duration/Length of Stay: 4 weeks Treatment/Interventions: Cognitive remediation/compensation;Cueing hierarchy;Environmental controls;Functional tasks;Internal/external aids;Dysphagia/aspiration precaution training;Speech/Language facilitation;Patient/family education   Daily Session  Skilled Therapeutic Interventions: Skilled treatment session focused on cognitive goals. Upon arrival, patient was supine in bed. Patient reported multiple times that she was tired and appeared only mildly restless in bed. Patient required hand over hand assist to initiate self-tasks without success, however, patient followed basic commands in order to allow SLP to complete oral care, etc. SLP also provided passive orientation throughout session. Towards the middle of the session, patient became extremely restless in bed while moaning but was unable to report what was wrong. The patient's PEG tube than began leaking large amounts of stomach  content, RN aware. Patient required total A for dressing and for the  transfer to the wheelchair while her bed was cleaned. Patient became restless in chair with mild verbal agitation that was eventually redirected. Patient left upright in the wheelchair with an OT and PT present. Continue with current plan of care.      Pain Pain Assessment Pain Score: No/Denies Pain   Therapy/Group: Individual Therapy  Beth [name] 06/19/2020, 6:37 AM

## 2020-06-19 NOTE — Progress Notes (Signed)
During patient rounds patient presented with laceration to left lower lip. On examination patient mitten presented with blood on right mitt. Patient presented biting at mitt and pulling with teeth at Velcro to mitt. MD Kirsteins made aware. Bumpers added to bed and new mitt placed on right hand. Sanda Linger, LPN

## 2020-06-20 ENCOUNTER — Inpatient Hospital Stay (HOSPITAL_COMMUNITY): Payer: Medicaid Other | Admitting: Speech Pathology

## 2020-06-20 ENCOUNTER — Inpatient Hospital Stay (HOSPITAL_COMMUNITY): Payer: Medicaid Other

## 2020-06-20 DIAGNOSIS — K121 Other forms of stomatitis: Secondary | ICD-10-CM

## 2020-06-20 DIAGNOSIS — R739 Hyperglycemia, unspecified: Secondary | ICD-10-CM

## 2020-06-20 LAB — GLUCOSE, CAPILLARY
Glucose-Capillary: 111 mg/dL — ABNORMAL HIGH (ref 70–99)
Glucose-Capillary: 121 mg/dL — ABNORMAL HIGH (ref 70–99)
Glucose-Capillary: 125 mg/dL — ABNORMAL HIGH (ref 70–99)
Glucose-Capillary: 132 mg/dL — ABNORMAL HIGH (ref 70–99)
Glucose-Capillary: 78 mg/dL (ref 70–99)
Glucose-Capillary: 80 mg/dL (ref 70–99)
Glucose-Capillary: 84 mg/dL (ref 70–99)

## 2020-06-20 MED ORDER — BLISTEX MEDICATED EX OINT
1.0000 "application " | TOPICAL_OINTMENT | Freq: Every day | CUTANEOUS | Status: DC
  Administered 2020-06-20 – 2020-06-21 (×6): 1 via TOPICAL
  Filled 2020-06-20 (×2): qty 6.3

## 2020-06-20 NOTE — Progress Notes (Signed)
Physical Therapy Session Note  Patient Details  Name: Beth [name] MRN: [id number] Date of Birth: [birth year]  Today's Date: 06/20/2020 PT Individual Time: 1000-1100 PT Individual Time Calculation (min): 60 min   Short Term Goals: Week 2:  PT Short Term Goal 1 (Week 2): Patient will perform bed mobility with mod A consistently. PT Short Term Goal 2 (Week 2): Patient will participate in functional activities >10 min intervals during therapy sessions. PT Short Term Goal 3 (Week 2): Patient will perform transfers bed<>w/c with mod A consistently. PT Short Term Goal 4 (Week 2): Patient will ambulate >50 ft with max +2 assist.  Skilled Therapeutic Interventions/Progress Updates:     Patient in enclosure bed asleep upon PT arrival. Patient aroused nodding to questions with eyes closed to verbal and tactile stimuli and agreeable to PT session. No outward signs of pain throughout session, RN reports patient was medicated for pain prior to session.   Therapeutic Activity: Bed Mobility: Patient indicating that she was cold in the bed, agreeable to put personal clothing on. Patient initially pushing back when PT attempted to assist her to sitting. Removed paper scrub pants and threaded B lower extremities in sweat pants bed level. Patient performed supine to/from sit with mod A with increased time for initiation to sit up, heavy trunk assist to prevent weight bearing through upper extremities. When returning to the bed the patient dove into lying once sitting EOB, required assist for safety. Provided verbal cues for initiation and sequencing throughout. Sitting EOB with min-mod A for sitting balance, patient doffed hospital gown with total A and donned a sweatshirt with max-mod A, requiring total A for L arm management, then assisted with pulling the shirt over her head and put her R arm through with set up assist. Provided mod cues throughout.  Transfers: Patient performed sit to/from stand from the bed,  required +2 assist for pulling pants up in standing with max A of 1 person for balance. She performed stand pivot bed<>TIS w/c and TIS<>BSC over the toilet with use of arm rests and/or grab bar with max-mod A +1 and a second person for lower body clothing manamgnet and peri-care. Patient was continent of bladder during toileting, and initiated toileting by stating, "I need to pee." Provided verbal cues for timing, sequencing, control of impulsive sitting during peri-care, and L foot placement management throughout transfers. Blocked L knee in standing at all times due to L inattention.  Neuromuscular Re-ed: Patient performed the following activities seated in the TIS w/c: -identifying the color of a small ball (green, red, blue) and placing it in a cup, patient did not participate in color identification, but did reach for the ball on her L with her R hand and place it in the cup with cues, and grabbed one ball out of the cup following cues; attempted to engage L hand with hand over hand assist without volitional initiation from patient -attempted to use BITS system, however, therapist had difficulty setting up appropriate task, however, patient recalled previous trial and did reach out to touch the screen with her R hand when a circle appeared x2 -patient performed horizontal head turns in sitting with slow initiation and max cues to the L x5, patient closed her eyes intermittently throughout, but denied dizziness  Patient in secured enclosure bed at end of session with breaks locked, Telesitter in the room, and all needs within reach.    Therapy Documentation Precautions:  Precautions Precautions: Fall, Other (comment) Precaution Comments: Lt radial head  fx; PIP fx Rt little finger, and MCP fx Rt ring finger; unrestricted ROM LUE per Dr Doreatha Martin note 10/29 Required Braces or Orthoses: Splint/Cast Splint/Cast: ulnar gutter splint (RUE); Lt elbow bean bag splint for elbow extension;  Lt UE NWB;  peg Restrictions Weight Bearing Restrictions: Yes RUE Weight Bearing: Non weight bearing LUE Weight Bearing: Non weight bearing   Therapy/Group: Individual Therapy  Donnae Michels L Anzlee Hinesley PT, DPT  06/20/2020, 12:46 PM

## 2020-06-20 NOTE — Progress Notes (Signed)
Speech Language Pathology Daily Session Notes  Patient Details  Name: Beth [name] MRN: [id number] Date of Birth: 06/15/1986  Today's Date: 06/20/2020  Session 1: SLP Individual Time: 0700-0730 SLP Individual Time Calculation (min): 30 min   Session 2: SLP Individual Time: 1962-2297 SLP Individual Time Calculation (min): 25 min  Short Term Goals: Week 2: SLP Short Term Goal 1 (Week 2): Patient will consume trials of Dys. 1 textures and thin liquids with minimal overt s/s of aspiration over 3 sessions to asses readiness for MBS. SLP Short Term Goal 2 (Week 2): Patient will demonstrate sustained attention to a functional task for ~2 minutes with Max verbal and tactile cues for redirection. SLP Short Term Goal 3 (Week 2): Patient will answer basic yes/no questions in regards to wants/needs in 50% of opportunities. SLP Short Term Goal 4 (Week 2): Patient will participate in 2 functional tasks within a session in 50% of opportunities with Max verbal cues. SLP Short Term Goal 5 (Week 2): Patient will follow basic commands in 50% of opportunities with Max verbal cues.  Skilled Therapeutic Interventions:  Session 1: Skilled treatment session focused on cognitive and dysphagia goals. Upon arrival, patient was awake but appeared lethargic while supine in enclosure bed. Patient sat EOB with total +2 assist due to fatigue. SLP facilitated session by providing Mod verbal and visual cues for initiation and thoroughness with oral care via the suction toothbrush. Patient attempted to wipe her lips but appeared to grimace in pain (lips extremely dry and appear to have blisters) and also pointed at her lips when asked if she was in pain (physician aware). Patient also initiated sticking out her finger for SLP to apply chap stick with hand over hand assist needed for placement. Patient consumed trials of ice chips without overt s/s of aspiration but with delayed AP transit, suspect due to fatigue and poor  awareness of bolus. Patient with minimal verbal output and utilized gestures for communication and frequently rested her head on clinician while sitting upright. Patient returned to supine in enclosure bed. Continue with current plan of care.   Session 2: Skilled treatment session focused on cognitive goals. Upon arrival, patient was awake in bed. SLP facilitated session by providing Max A verbal and tactile cues for initiation and thoroughness of oral care via the suction toothbrush. SLP also applied ointment to the patient's lips as they are extremely dry and starting to develop blisters. However, patient's lips appear mildly improved compared to this morning with patient allowing warm compresses with eventual removal of some dry skin. Patient unable to name family members in pictures but when asked yes/no questions regarding their relationship, patient was only 20% accurate. Patient continues to demonstrate lethargy throughout the day which appears mildly increased in the afternoon. Patient left secure in enclosure bed. Continue with current plan of care.   Pain No/Denies Pain   Therapy/Group: Individual Therapy  Fatuma Dowers, [place] 06/20/2020, 9:30 AM
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
## 2020-06-20 NOTE — Progress Notes (Signed)
Occupational Therapy Session Note  Patient Details  Name: Beth [name] MRN: [id number] Date of Birth: 03-Jun-1986  Today's Date: 06/20/2020 OT Individual Time: 7322-0254 OT Individual Time Calculation (min): 55 min    Short Term Goals: Week 1:  OT Short Term Goal 1 (Week 1): Pt will participate 15 mins without attempts to return to supine OT Short Term Goal 1 - Progress (Week 1): Met OT Short Term Goal 2 (Week 1): Pt will engage in bathing with max assist OT Short Term Goal 2 - Progress (Week 1): Progressing toward goal OT Short Term Goal 3 (Week 1): Pt will complete UB dressing with max assist OT Short Term Goal 3 - Progress (Week 1): Progressing toward goal OT Short Term Goal 4 (Week 1): Pt will complete toilet transfer with max assist OT Short Term Goal 4 - Progress (Week 1): Progressing toward goal  Skilled Therapeutic Interventions/Progress Updates:    1:1. Pt received in enclosure bed with half of bed unzipped! Alerted RN staff to error. Pt asleep and easily aroused. Pt able to follow 1 step commands to lifting Legs and rolling in B directions to advance pants past hips. Pt completes stand pivot transfer EOB<>TIS with pt initiating without OT counting but not in an impulsive manner. Pt completes BITs visual scanning in dark tx room with dots centrally focused and pt requiring 20 seconds and max VC for initiating reach forward to hit the dot and 50% accuracy. Pt completes dynamic reaching task reaching laterally and crossing midline with RUE to obtain horseshoe and hook it on arm of chair. Pt requires prolinged frequent rest breaks d/t attention deficits/fatigue. Exited session with pt seated in enclosure bed, exit alarm on and call light in reach   Therapy Documentation Precautions:  Precautions Precautions: Fall, Other (comment) Precaution Comments: Lt radial head fx; PIP fx Rt little finger, and MCP fx Rt ring finger; unrestricted ROM LUE per Dr Doreatha Martin note 10/29 Required Braces  or Orthoses: Splint/Cast Splint/Cast: ulnar gutter splint (RUE); Lt elbow bean bag splint for elbow extension;  Lt UE NWB; peg Restrictions Weight Bearing Restrictions: Yes RUE Weight Bearing: Non weight bearing LUE Weight Bearing: Non weight bearing General:   Vital Signs: Therapy Vitals Temp: 98.1 F (36.7 C) Temp Source: Oral Pulse Rate: 89 Resp: 17 BP: (!) 92/57 Patient Position (if appropriate): Lying Oxygen Therapy SpO2: 99 % O2 Device: Room Air Pain:   ADL: ADL Eating: NPO Upper Body Bathing: Dependent Where Assessed-Upper Body Bathing: Bed level Lower Body Bathing: Dependent (+2) Where Assessed-Lower Body Bathing: Bed level Upper Body Dressing: Dependent Where Assessed-Upper Body Dressing: Bed level Lower Body Dressing: Dependent Where Assessed-Lower Body Dressing: Bed level Toileting: Dependent Vision   Perception    Praxis   Exercises:   Other Treatments:     Therapy/Group: Individual Therapy  Tonny Branch 06/20/2020, 6:45 AM

## 2020-06-20 NOTE — Progress Notes (Signed)
Physical Therapy Note  Patient Details  Name: Beth [name] MRN: [id number] Date of Birth: 02/11/1986 Today's Date: 06/20/2020    Shortly after PT session. Noted family member in the room with patient with enclosure bed open. Entered the room and family member introduced themselves as the patient's aunt. Noted that she was using a curling iron to curl the patient's hair with the enclosure bed open. Educated on safety concerns with family opening the enclosure bed and around items that could burn the patient at this time in the patient's recovery due to patient's restlessness and decreased awareness. Her aunt stated understanding, but continued to work on the patient's hair. Patient resting calmly. Her aunt stated that she would be done soon and close the enclosure bed after. RN notified and will provide reinforcement of safety education with family and ensure patient is secured safely in the bed.    Razi Hickle L Adaia Matthies PT, DPT  06/20/2020, 11:05 AM

## 2020-06-21 ENCOUNTER — Inpatient Hospital Stay (HOSPITAL_COMMUNITY): Payer: Medicaid Other

## 2020-06-21 DIAGNOSIS — K9423 Gastrostomy malfunction: Secondary | ICD-10-CM

## 2020-06-21 DIAGNOSIS — B002 Herpesviral gingivostomatitis and pharyngotonsillitis: Secondary | ICD-10-CM

## 2020-06-21 LAB — GLUCOSE, CAPILLARY
Glucose-Capillary: 95 mg/dL (ref 70–99)
Glucose-Capillary: 142 mg/dL — ABNORMAL HIGH (ref 70–99)
Glucose-Capillary: 87 mg/dL (ref 70–99)
Glucose-Capillary: 85 mg/dL (ref 70–99)
Glucose-Capillary: 136 mg/dL — ABNORMAL HIGH (ref 70–99)

## 2020-06-21 MED ORDER — ONDANSETRON HCL 4 MG/2ML IJ SOLN: 4.0000 mg | Freq: Four times a day (QID) | INTRAMUSCULAR | Status: DC | PRN

## 2020-06-21 MED ORDER — VALACYCLOVIR HCL 500 MG PO TABS
1000.0000 mg | ORAL_TABLET | Freq: Two times a day (BID) | ORAL | Status: AC
  Administered 2020-06-22 – 2020-07-01 (×19): 1000 mg
  Filled 2020-06-21 (×19): qty 2

## 2020-06-21 MED ORDER — ONDANSETRON HCL 4 MG PO TABS: 4.0000 mg | ORAL_TABLET | Freq: Four times a day (QID) | ORAL | Status: DC | PRN

## 2020-06-21 MED ORDER — VALACYCLOVIR HCL 500 MG PO TABS
1000.0000 mg | ORAL_TABLET | Freq: Two times a day (BID) | ORAL | Status: DC
  Administered 2020-06-21: 1000 mg via ORAL
  Filled 2020-06-21 (×2): qty 2

## 2020-06-21 MED ORDER — BLISTEX MEDICATED EX OINT
1.0000 "application " | TOPICAL_OINTMENT | Freq: Three times a day (TID) | CUTANEOUS | Status: DC
  Administered 2020-06-21 – 2020-07-13 (×65): 1 via TOPICAL
  Filled 2020-06-21: qty 6.3

## 2020-06-21 NOTE — Progress Notes (Signed)
Physical Therapy Session Note  Patient Details  Name: Beth [name] MRN: [id number] Date of Birth: 1985/10/14  Today's Date: 06/21/2020 PT Individual Time: 1445-1525 PT Individual Time Calculation (min): 40 min   Short Term Goals: Week 2:  PT Short Term Goal 1 (Week 2): Patient will perform bed mobility with mod A consistently. PT Short Term Goal 2 (Week 2): Patient will participate in functional activities >10 min intervals during therapy sessions. PT Short Term Goal 3 (Week 2): Patient will perform transfers bed<>w/c with mod A consistently. PT Short Term Goal 4 (Week 2): Patient will ambulate >50 ft with max +2 assist.  Skilled Therapeutic Interventions/Progress Updates:     Patient in enclosure bed with NT in the room upon PT arrival. Patient alert and initially resistant to performing mobility due to fatigue, but agreeable with increased time/encouragement. Patient denied pain during session. NT took patient's vitals at beginning of session, first time with low BP, however, patient moving throughout, second time vitals WNL, see flowseet. Provided gentle L elbow extension ROM while vitals were bing taken. Lacking ~30 deg of elbow extension with hard end feel, also noted increased spasticity with elbow extension with increased velocity of movement with catch and release throughout 50% of range.  Therapeutic Activity: Bed Mobility: Patient performed rolling R/L with min A and increased time for initiation. She performed supine to sit with mod A with lower extremities blocked by a second person to prevent patient returning them onto the bed and assist under B shoulders to prevent upper extremity weight bearing. Provided verbal cues for sequencing and timing, patient with good initiation of trunk control and placed L arm around therapist to assist with pulling up. Patient sat EOB with min A progressing to CGA with improved midline orientation after an initial L lean with cues. Improved trunk  extension in sitting as well this session. Transfers: Patient performed sit to/from stand x1 with mod-min A +2 while blocking L knee in standing, patient stood >30 sec and able to extend L knee with cues and improved posture in standing with facilitation and cues for hip and trunk extension. Patient then performed pivot to TIS w/c, required max-total A for stepping and max cues for sequencing, patient with eyes closed during and unaware of TIS w/c being behind her. Required mod-max A +2 to complete transfers. She performed stand pivot TIS w/c>bed with mod-max A with poor control for descent due to patient attempting to lay in the bed from standing rather than sit due to fatigue. Provided verbal cues for safety and sequencing throughout.  Attempted to take patient out of the room during session, patient stated "I want to stay in here," and PT accommodated the patient's request to encourage patient to advocate for her preferences with staff.   Neuromuscular Re-ed: Patient performed the following activities sitting in TIS w/c: -looking at pictures of family on her L for improve L attention and recall, patient named family (name and relationship) provided correct responses in 1-3 trials x8 pictures -provided sensory feedback using light touch, rough washcloth, and tapping to L arm, cued patient to look at her L arm during to promote increased attention to her L upper extremity -cued patient to squeeze therapist's hand, patient able to initiate volitional grasp 1/8 trials -place an empty cup in patient's hand with total A due to lack of thumb extension, patient able to maintain grasp with wrist supported 2/5 trials, patient grabbed the cup from her L hand and bring it to her mouth,  as if to drink, x2 Patient perseverated on "she is my heart" and "I love my sister" throughout activitiesand required mod-max cues to redirect patient to each task  Patient with increased verbal communication, L attention, and  arousal throughout session today. Continues to present with decreased attention, verbal perseveration, decreased attention to L upper extremity, decreased orientation (provided orientation throughtout, stated she was in [place] and recalled flying to [place] after told where she was), and decreased awareness with all mobility and activities.    Patient in secured enclosure bed with Telesitter in place at end of session with breaks locked and all needs within reach.    Therapy Documentation Precautions:  Precautions Precautions: Fall, Other (comment) Precaution Comments: Lt radial head fx; PIP fx Rt little finger, and MCP fx Rt ring finger; unrestricted ROM LUE per Dr Doreatha Martin note 10/29 Required Braces or Orthoses: Splint/Cast Splint/Cast: ulnar gutter splint (RUE); Lt elbow bean bag splint for elbow extension;  Lt UE NWB; peg Restrictions Weight Bearing Restrictions: Yes RUE Weight Bearing: Non weight bearing LUE Weight Bearing: Non weight bearing   Therapy/Group: Individual Therapy  Uzair Godley L Keyen Marban PT, DPT  06/21/2020, 4:41 PM

## 2020-06-21 NOTE — Progress Notes (Signed)
Occupational Therapy Session Note  Patient Details  Name: Beth [name] MRN: [id number] Date of Birth: January 25, 1986  Today's Date: 06/21/2020 OT Individual Time: 0034-9179 OT Individual Time Calculation (min): 70 min    Short Term Goals: Week 2:  OT Short Term Goal 1 (Week 2): Pt will consistently participate in 3 grooming tasks during one therapy session OT Short Term Goal 2 (Week 2): Pt will complete toilet transfer with max A of 1 person OT Short Term Goal 3 (Week 2): maintain sitting balance for 3 minutes with mod A of 1  Skilled Therapeutic Interventions/Progress Updates:    Pt received in enclosure bed with RN present administering medication. Pt lethargic and not oriented x3 throughout session, gentle reorientation provided. Pt's husband [name] arrived shortly after session began. TBI education provided throughout session, especially L inattention, TBI overstimulation, cueing, and LUE NMR. Pt transitioned to EOB with min A. EOB she required fluctuating min-mod A for static sitting balance, fatigue/ activity tolerance playing a role in pt wanting to return to supine vs core instability to some degree. Pt completed squat pivot transfer to the w/c with max A +1, with her husband assisting in stabilizing the w/c. Pt frequently leaning to the R in the w/c and requiring and min A to return to midline. Pt often keeping eyes closed. Environment was adjusted accordingly to reduce stimulation via lights and sounds. Pt's husband began facetiming her children while pt was participating in bathing and edu was provided re need to attention deficits and over stimulation. Pt completed grooming at the sink with mod A overall. Pt requiring min cueing for termination of tasks. Max A to wash UB- pt following commands with 50% accuracy overall. Pt donned new shirt with max A, pt completing 25%-pulling overhead. Pt demonstrating improvement in automatic tasks, especially with backward chaining techniques for  dressing. Pt stood at the sink with mod A for peri hygiene (total A for this hygiene). Pt completed 3x sit <> stands- using her RUE elbow on the sink to avoid weightbearing through the wrist/hand. Her R splint was removed and skin assessed- no s/s of breakdown or irritation. Pt now quite fatigued and requesting to go back to bed. Pt completed squat pivot transfer back to bed with max A. She was left supine with all needs met, enclosure bed secured.   Therapy Documentation Precautions:  Precautions Precautions: Fall, Other (comment) Precaution Comments: Lt radial head fx; PIP fx Rt little finger, and MCP fx Rt ring finger; unrestricted ROM LUE per Dr Doreatha Martin note 10/29 Required Braces or Orthoses: Splint/Cast Splint/Cast: ulnar gutter splint (RUE); Lt elbow bean bag splint for elbow extension;  Lt UE NWB; peg Restrictions Weight Bearing Restrictions: Yes RUE Weight Bearing: Non weight bearing LUE Weight Bearing: Non weight bearing  Therapy/Group: Individual Therapy  [name] 06/21/2020, 11:56 AM

## 2020-06-21 NOTE — Progress Notes (Signed)
Pt cont to require use of enclosure bed. She continues to be intermittently confused and disoriented to place and time and situation. Pt continues to pull at line, drain, and tubes and continues to forcefully turn about in the bed.
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
## 2020-06-22 ENCOUNTER — Inpatient Hospital Stay (HOSPITAL_COMMUNITY): Payer: Medicaid Other | Admitting: Speech Pathology

## 2020-06-22 ENCOUNTER — Inpatient Hospital Stay (HOSPITAL_COMMUNITY): Payer: Medicaid Other | Admitting: Occupational Therapy

## 2020-06-22 ENCOUNTER — Ambulatory Visit (HOSPITAL_COMMUNITY): Payer: Medicaid Other

## 2020-06-22 LAB — CBC WITH DIFFERENTIAL/PLATELET
Abs Immature Granulocytes: 0.01 10*3/uL (ref 0.00–0.07)
Basophils Absolute: 0 10*3/uL (ref 0.0–0.1)
Basophils Relative: 1 %
Eosinophils Absolute: 0.7 10*3/uL — ABNORMAL HIGH (ref 0.0–0.5)
Eosinophils Relative: 12 %
HCT: 37.5 % (ref 36.0–46.0)
Hemoglobin: 12 g/dL (ref 12.0–15.0)
Immature Granulocytes: 0 %
Lymphocytes Relative: 36 %
Lymphs Abs: 2 10*3/uL (ref 0.7–4.0)
MCH: 29.4 pg (ref 26.0–34.0)
MCHC: 32 g/dL (ref 30.0–36.0)
MCV: 91.9 fL (ref 80.0–100.0)
Monocytes Absolute: 0.3 10*3/uL (ref 0.1–1.0)
Monocytes Relative: 6 %
Neutro Abs: 2.5 10*3/uL (ref 1.7–7.7)
Neutrophils Relative %: 45 %
Platelets: 312 10*3/uL (ref 150–400)
RBC: 4.08 MIL/uL (ref 3.87–5.11)
RDW: 12 % (ref 11.5–15.5)
WBC: 5.6 10*3/uL (ref 4.0–10.5)
nRBC: 0 % (ref 0.0–0.2)

## 2020-06-22 LAB — GLUCOSE, CAPILLARY
Glucose-Capillary: 85 mg/dL (ref 70–99)
Glucose-Capillary: 90 mg/dL (ref 70–99)
Glucose-Capillary: 97 mg/dL (ref 70–99)
Glucose-Capillary: 111 mg/dL — ABNORMAL HIGH (ref 70–99)
Glucose-Capillary: 77 mg/dL (ref 70–99)
Glucose-Capillary: 102 mg/dL — ABNORMAL HIGH (ref 70–99)

## 2020-06-22 MED ORDER — PROPRANOLOL HCL 10 MG PO TABS
10.0000 mg | ORAL_TABLET | Freq: Two times a day (BID) | ORAL | Status: DC
  Administered 2020-06-22: 10 mg
  Filled 2020-06-22 (×2): qty 1

## 2020-06-22 NOTE — Progress Notes (Signed)
Physical Therapy Session Note  Patient Details  Name: Beth [name] MRN: [id number] Date of Birth: 12-27-1985  Today's Date: 06/22/2020 PT Individual Time: 0800-0900 PT Individual Time Calculation (min): 60 min   Short Term Goals: Week 2:  PT Short Term Goal 1 (Week 2): Patient will perform bed mobility with mod A consistently. PT Short Term Goal 2 (Week 2): Patient will participate in functional activities >10 min intervals during therapy sessions. PT Short Term Goal 3 (Week 2): Patient will perform transfers bed<>w/c with mod A consistently. PT Short Term Goal 4 (Week 2): Patient will ambulate >50 ft with max +2 assist.  Skilled Therapeutic Interventions/Progress Updates:    Patient received supine in enclosure bed, asleep, agreeable to PT. She was found to have been incontinent of bladder. Patient initially resistant to assistance to come to sit edge of bed, but was repeatedly saying "I need help." ModA to successfully come to sit edge of bed with 2nd person blocking B LE from returning to bed. MaxA to don socks while seated edge of bed with 1 person providing up to MinA to maintain sitting balance. Patient able to initiate hip flexion B to assist with donning socks. MaxA x2 to ambulate to bathroom with intermittent assist to advance L LE with L knee blocking during stance phase. Patient requiring consistent cuing to maintain focus on task of ambulating to bathroom. With fatigue or lapse in attention to task, patient assuming forward flexed posture with crouched gait pattern. Patient did not void or have bowel movement once of toilet. With Max verbal cues and initial hand over hand assist, patient able to wash face with wash cloth. Initial poor attention to L side of face requiring hand over hand assist to bring washcloth in R hand to L side of face. Patient did not initiate functional tasks with L UE at this time. Multiple sit <> stands to complete peri hygiene with R UE. Patient able to remain  standing ~30s at a time, but consistent verbal cues needed to initiate task with R UE. Patient ambulating from toilet to wc ~35ft with MaxA x2. Manual facilitation of weight shift R. Patient able to advance L LE with only intermittent assist for placement into abduction to prevent NBOS and L knee blocking. PT propelling patient in wc to therapy gym for time management and energy conservation. She was able to stand at counter with [place] x2 to place assorted fruit into basket. She was able to pick up correct fruit when asked to pick up specific fruit 100% of the time. Short attention to task, but patient able to complete 4 fruit at a time before returning to sitting. Through repetition of the task, patient only able to attend to task for long enough to place 1 fruit at a time in standing. Patient able to stand to place cups into cabinet with R UE. Initially able to place 4 cups at a time before needing to sit, progressing to  1cup at a time due to fatigue and limited attention to task. Patient typically requiring Max verbal cues for upright posture, but emerging ability to self-adjust posture without external cuing noted throughout therapy session. Patient ambulating additional 51ft with [place] x2. Improving ability to advance L LE. Appropriate weight shift with only Min manual facilitation to weight shift R during L swing. Patient remaining up in wc, seatbelt used as positioning aid, patient left at nurses station due to need for enclosure to cleaned.    Therapy Documentation Precautions:  Precautions Precautions:  Fall, Other (comment) Precaution Comments: Lt radial head fx; PIP fx Rt little finger, and MCP fx Rt ring finger; unrestricted ROM LUE per Dr Doreatha Martin note 10/29 Required Braces or Orthoses: Splint/Cast Splint/Cast: ulnar gutter splint (RUE); Lt elbow bean bag splint for elbow extension;  Lt UE NWB; peg Restrictions Weight Bearing Restrictions: Yes RUE Weight Bearing: Non weight bearing LUE Weight  Bearing: Non weight bearing    Therapy/Group: Individual Therapy  Karoline Caldwell, PT, DPT, CBIS  06/22/2020, 7:39 AM

## 2020-06-22 NOTE — Progress Notes (Signed)
Occupational Therapy Session Note  Patient Details  Name: Tayler Heiden MRN: [id number] Date of Birth: January 17, 1986  Today's Date: 06/22/2020 OT Individual Time: 1103-1200 OT Individual Time Calculation (min): 57 min   Short Term Goals: Week 2:  OT Short Term Goal 1 (Week 2): Pt will consistently participate in 3 grooming tasks during one therapy session OT Short Term Goal 2 (Week 2): Pt will complete toilet transfer with max A of 1 person OT Short Term Goal 3 (Week 2): maintain sitting balance for 3 minutes with mod A of 1  Skilled Therapeutic Interventions/Progress Updates:    Pt greeted sidelying in bed with eyes closed. Pt lethargic and needed total A to thread and pull up pants. Pt needed total A to then come to sitting EOB. Pt more alert in sitting. Worked on sitting balance at EOB with max A, but pt was able to then wash her face with set-up A and min verbal cues. Max +2 stand-pivot to TIS wc. Pt's significant other, Hilliard Clark, entered the room. Hilliard Clark wanted to complete patients facial skin care regimen. OT brought pt to the sink in wc and was able to assist with washing off face cream mask with verbal cues for thoroughness. Hilliard Clark showed pt a video of her friends saying hello and encouraging her. OT completed skin check under splint and repositioned ulnar gutter splint. OT placed SAEBO e-stim on left shoulder. SAEBO left on for 60 minutes. OT informed Hilliard Clark of behaviors watch out for and how to remove e-stim if patient is getting irritated.  OT returned to remove SAEBO with skin intact and no adverse reactions.  [place] Stim One 330 pulse width 35 Hz pulse rate On 8 sec/ off 8 sec Ramp up/ down 2 sec Symmetrical Biphasic wave form  Max intensity 165mA at 500 Ohm load    Therapy Documentation Precautions:  Precautions Precautions: Fall, Other (comment) Precaution Comments: Lt radial head fx; PIP fx Rt little finger, and MCP fx Rt ring finger; unrestricted ROM LUE per Dr Doreatha Martin note  10/29 Required Braces or Orthoses: Splint/Cast Splint/Cast: ulnar gutter splint (RUE); Lt elbow bean bag splint for elbow extension;  Lt UE NWB; peg Restrictions Weight Bearing Restrictions: Yes RUE Weight Bearing: Non weight bearing LUE Weight Bearing: Non weight bearing Pain: Pain Assessment Pain Scale: Faces Pain Score: 0-No pain (unable to give number)   Therapy/Group: Individual Therapy  Valma Cava 06/22/2020, 12:10 PM

## 2020-06-22 NOTE — Progress Notes (Signed)
Speech Language Pathology Daily Session Note  Patient Details  Name: Faye Strohman MRN: [id number] Date of Birth: 11/13/1985  Today's Date: 06/22/2020  Session 1: SLP Individual Time: 1030-1055 SLP Individual Time Calculation (min): 25 min   Session 2: SLP Individual Time: 0175-1025 SLP Individual Time Calculation (min): 35 min Missed Time: 25 minutes, fatigue   Short Term Goals: Week 2: SLP Short Term Goal 1 (Week 2): Patient will consume trials of Dys. 1 textures and thin liquids with minimal overt s/s of aspiration over 3 sessions to asses readiness for MBS. SLP Short Term Goal 2 (Week 2): Patient will demonstrate sustained attention to a functional task for ~2 minutes with Max verbal and tactile cues for redirection. SLP Short Term Goal 3 (Week 2): Patient will answer basic yes/no questions in regards to wants/needs in 50% of opportunities. SLP Short Term Goal 4 (Week 2): Patient will participate in 2 functional tasks within a session in 50% of opportunities with Max verbal cues. SLP Short Term Goal 5 (Week 2): Patient will follow basic commands in 50% of opportunities with Max verbal cues.  Skilled Therapeutic Interventions:  Session 1: Skilled treatment session focused on cognitive goals. Upon arrival, patient was restless in the enclosure bed with intermittent moaning and perseveration on "oh my gosh." Patient indicated she was in pain, therefore, RN administered medications. SLP facilitated session by providing Max A verbal cues for initiation of self-feeding and total A for thoroughness with focused attention to task for ~20 second intervals. Patient also applied chapstick to her lips with Max tactile cues. Patient was not sat up on EOB this session due to restlessness and low BP of 90/50. Physician aware. Patient left supine in enclosure bed. Continue with current plan of care.   Session 2: Skilled treatment session focused on dysphagia and cognitive goals. Upon arrival, patient  was sitting upright in the TIS wheelchair and appeared restless. Patient had been sitting up for ~3 hours and was requesting to go back to bed. SLP attempted to redirect the patient by bringing her to the SLP office. Patient consumed minimal trials of ice chips and pureed textures without overt s/s of aspiration noted. However, patient was not opening her mouth wide enough to except full bolus and had poor awareness of ice chips due to decreased focused attention to task.  Patient yelling out, "no" and threatening to punch clinicians in the face as she was tired and wanted to get back into bed. Patient had been incontinent of urine and had soaked through her pants. Patient transferred back to the enclosure bed with Max +2 for safety due to impulsivity. Patient required total +2 for bed mobility, peri care and changing of clothes with patient voicing frustration throughout task. Patient left secured in enclosure bed and missed remaining 25 minutes of session due to fatigue. Continue with current plan of care.   Pain Pain Assessment Pain Scale: Faces Pain Score: 0-No pain (unable to give number) Faces Pain Scale: Hurts even more Pain Type: Acute pain Pain Location: Wrist Pain Orientation: Right Pain Descriptors / Indicators: Grimacing;Restless;Moaning Pain Frequency: Intermittent Pain Intervention(s): Medication (See eMAR) Multiple Pain Sites: No  Therapy/Group: Individual Therapy  Dallana Mavity 06/22/2020, 1:14 PM
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
## 2020-06-22 NOTE — Progress Notes (Signed)
Patients BP 90/59 and HR 89.  RN held Inderal due to low BP.  Dr. Ranell Patrick present at bedside and informed.  Dr. Ranell Patrick stated, "Yes that's exactly what you should do."

## 2020-06-23 ENCOUNTER — Inpatient Hospital Stay (HOSPITAL_COMMUNITY): Payer: Medicaid Other | Admitting: Speech Pathology

## 2020-06-23 ENCOUNTER — Inpatient Hospital Stay (HOSPITAL_COMMUNITY): Payer: Medicaid Other

## 2020-06-23 ENCOUNTER — Ambulatory Visit (HOSPITAL_COMMUNITY): Payer: Medicaid Other

## 2020-06-23 ENCOUNTER — Inpatient Hospital Stay (HOSPITAL_COMMUNITY): Payer: Medicaid Other | Admitting: Occupational Therapy

## 2020-06-23 LAB — GLUCOSE, CAPILLARY
Glucose-Capillary: 100 mg/dL — ABNORMAL HIGH (ref 70–99)
Glucose-Capillary: 141 mg/dL — ABNORMAL HIGH (ref 70–99)
Glucose-Capillary: 80 mg/dL (ref 70–99)
Glucose-Capillary: 87 mg/dL (ref 70–99)
Glucose-Capillary: 88 mg/dL (ref 70–99)
Glucose-Capillary: 91 mg/dL (ref 70–99)

## 2020-06-23 MED ORDER — PROPRANOLOL HCL 10 MG PO TABS
10.0000 mg | ORAL_TABLET | Freq: Every day | ORAL | Status: DC
  Administered 2020-06-24 – 2020-06-27 (×4): 10 mg
  Filled 2020-06-23 (×4): qty 1

## 2020-06-23 NOTE — Progress Notes (Signed)
A call was received from patient's father and update support provided.

## 2020-06-23 NOTE — Progress Notes (Signed)
Patient ID: Beth [name], female   DOB: 09/13/1985, 34 y.o.   MRN: [id number]   SW spoke with [name] [phone]) and [name] to provide updates from team conference, d/c date 12/18, and discussed family meeting. Family schedule is flexible with meeting time. Pt fiance would like to explore SNF options for pt. SW explained pt would be private pay since her [place] and for SNFs this will not be in network. He would like to explore these options. SW did inform on neurorestorative care as being possible option as well. Sw left packet in room for fiance to pick up.   Loralee Pacas, MSW, [place] Office: [phone] Cell: [phone] Fax: [phone]

## 2020-06-23 NOTE — Progress Notes (Signed)
Occupational Therapy Session Note  Patient Details  Name: Beth [name] MRN: [id number] Date of Birth: 14-Sep-1985  Today's Date: 06/23/2020 OT Individual Time: 5537-4827 OT Individual Time Calculation (min): 50 min    Short Term Goals: Week 2:  OT Short Term Goal 1 (Week 2): Pt will consistently participate in 3 grooming tasks during one therapy session OT Short Term Goal 2 (Week 2): Pt will complete toilet transfer with max A of 1 person OT Short Term Goal 3 (Week 2): maintain sitting balance for 3 minutes with mod A of 1  Skilled Therapeutic Interventions/Progress Updates:    Pt greeted supine in bed lethargic, but agreeable. Pt needed total A +2 to come to sitting EOB 2/2 lethargy, but more awake once she was sitting up. Pt completed squat-pivot transfer with max A+2. Pt brought into bathroom and completed stand-pivot to toilet with max  A +2 and cues to initiate. +2 needed to assist with clothing management before pt sat down on commode. Pt with continent void in commode! Pt needed +2 around her at all times while on commode 2/2 impulsivity and tendency for LOB. +Tried to engage pt in peri-care, but unable to initiate task. LB dressing completed from toilet with pt able to follow commands to lift appropriate foot into pant legs, then pt was able to assist with pulling up her R side.Pt brought to the sink in wc where she was able to assist with washing her face. Hand over hand To integrate L UE into bathing tasks but still no activation on L side. OT removed R hand splint, washed hand and splint and assessed wounds. OT re-applied foam dressing and placed 2 layers of stockinette onto hand for skin protection and improved comfort. Pt brought out In hallway and ambulated 20 feet using 3 muskateers technique and assist for L knee block and bringing L leg forward at times. Worked on pt taking larger steps with R leg. Pt shook her head "no" for ambulating further and was brought back to the room where  she completed squat-pivot back to bed with max A +2. Pt left supine in enclosure bed with needs met.  Therapy Documentation Precautions:  Precautions Precautions: Fall, Other (comment) Precaution Comments: Lt radial head fx; PIP fx Rt little finger, and MCP fx Rt ring finger; unrestricted ROM LUE per Dr Doreatha Martin note 10/29 Required Braces or Orthoses: Splint/Cast Splint/Cast: ulnar gutter splint (RUE); Lt elbow bean bag splint for elbow extension;  Lt UE NWB; peg Restrictions Weight Bearing Restrictions: Yes RUE Weight Bearing: Non weight bearing LUE Weight Bearing: Non weight bearing Pain: Pt winces in pain with mobility of L shoulder and indicated her R wrist was hurting. Rest and repositioned for comfort.   Therapy/Group: Individual Therapy  Valma Cava 06/23/2020, 9:23 AM

## 2020-06-23 NOTE — Progress Notes (Signed)
Subjective: Patient in bed.  Responsive to questions.  Per nursing still with some pain in right hand.   Objective: Vital signs in last 24 hours: Temp:  [98.4 F (36.9 C)-98.9 F (37.2 C)] 98.4 F (36.9 C) (11/30 1353) Pulse Rate:  [76-93] 93 (11/30 1353) Resp:  [17-20] 20 (11/30 1353) BP: (88-106)/(59-71) 106/59 (11/30 1353) SpO2:  [97 %-100 %] 97 % (11/30 1353)  Intake/Output from previous day: No intake/output data recorded. Intake/Output this shift: No intake/output data recorded.  Recent Labs    06/22/20 0836  HGB 12.0   Recent Labs    06/22/20 0836  WBC 5.6  RBC 4.08  HCT 37.5  PLT 312   No results for input(s): NA, K, CL, CO2, BUN, CREATININE, GLUCOSE, CALCIUM in the last 72 hours. No results for input(s): LABPT, INR in the last 72 hours.  Non tender to palpation right small finger proximal phalanx.  Small finger crosses under ring finger when making a fist full nail width.  Blister/abrasion in palm.   Assessment/Plan: XR right small finger show healing of fracture with callus formation.  No further displacement of fracture.  Will start mobilization and can be out of splint.  No heavy gripping.  Work on ROM.  Can use splint for protection as necessary.    Leanora Cover 06/23/2020, 3:44 PM

## 2020-06-23 NOTE — Patient Care Conference (Signed)
Inpatient RehabilitationTeam Conference and Plan of Care Update Date: 06/23/2020   Time: 10:42 AM    Patient Name: Beth [name]      Medical Record Number: [id number]  Date of Birth: [birth year] Sex: Female         Room/Bed: 4W14C/4W14C-01 Payor Info: Payor: [place] / Plan: [employer] MEDICAID ADVANTAGE / Product Type: *No Product type* /    Admit Date/Time:  06/11/2020  8:36 PM  Primary Diagnosis:  TBI (traumatic brain injury) [facility])  Hospital Problems: Principal Problem:   TBI (traumatic brain injury) ([place]) Active Problems:   Sinus tachycardia   Psychogenic fecal incontinence   S/P percutaneous endoscopic gastrostomy (PEG) tube placement ([place])   Sleep disturbance   Acute blood loss anemia   Hyperglycemia   Oral ulcer   PEG tube malfunction [facility])   Oral herpes    Expected Discharge Date: Expected Discharge Date: 07/11/20  Team Members Present: Physician leading conference: Dr. Leeroy Cha Care Coodinator Present: Loralee Pacas, LCSWA;Yann Biehn Creig Hines, RN, BSN, [place] Nurse Present: Other (comment) Demetrios Loll, RN) PT Present: Tereasa Coop, PT OT Present: Cherylynn Ridges, OT SLP Present: Weston Anna, SLP PPS Coordinator present : Ileana Ladd, Burna Mortimer, SLP     Current Status/Progress Goal Weekly Team Focus  Bowel/Bladder   Patient is incontinent of Bladder/Bowel, LBM  06/18/20  a fewer episodes of incontinence  Assess B/B QS/PRN, time toileting q2 hrs and prn   Swallow/Nutrition/ Hydration   NPO with PEG  Min A  ongoing trials, attention to self-feeding   ADL's   Max A, +2 for safety  min/CGA overall  self-care retraining, transfers, OOB tolerance, toileting   Mobility             Communication   Max A  Min A  intelligibility, basic expression of wants/needs   Safety/Cognition/ Behavioral Observations  [place] Level IV-Total A  Min-Mod A  attention, inititation, participation in functional tasks   Pain             Skin    PEG tube site unremarkable, eccymosis areas to face,improving, surgical scar improving  skin intact  QS/PRN assessment     Discharge Planning:  Return to [facility] [place] to home with her fiance and three children. Support from mother and stepfather. Family is currently here in [place].   Team Discussion: Incontinent B/B. Yesterday told nursing she needed to go to the bathroom. Therapy tells nursing it is safer to do a +2 stand pivot transfer when she is alert and awake. Don't use the stedy because she has no trunk control at this time. OT reports casting her arm is not appropriate due to her wounds, she is a max assist +2 for safety and the focus is on self-care retraining, transfers, OOB tolerance. Goals are Min assist to contact guard. PT reports patient is mod assist for bed mobility, mod to max assist for ambulation. SLP reports patient remains NPO, has PEG tube for nutritional support, is more restless and agitated in the afternoon. Working on trials of different textures and self-feeding. Goals are min assist.  Patient on target to meet rehab goals: yes, and team is requesting a family conference to help family understand what is appropriate and what is not.  *See Care Plan and progress notes for long and short-term goals.   Revisions to Treatment Plan:  Not at this time.  Teaching Needs: Continue with family education. Education on not overstimulating the patient.  Current Barriers to Discharge: Inaccessible  home environment, Decreased caregiver support, Medical stability, Home enviroment access/layout, Incontinence, Wound care, Lack of/limited family support, Weight bearing restrictions, Behavior and Nutritional means  Possible Resolutions to Barriers: Continue family education, Continue education to not overstimulate patient, continue sleep/wake chart, educate weight bearing precautions, educate wound care and dressing changes, educate bowel and bladder schedule, provide emotional support to  patient and family.     Medical Summary Current Status: Incontinent, impulsive, pain in right wrist, tries to bite off her splint, blisters from splint improving, hypotensive, HR well controlled  Barriers to Discharge: Wound care;Medical stability;Behavior  Barriers to Discharge Comments: Incontinent, impulsive, pain in right wrist, tries to bite off her splint, blisters from splint, hypotensive Possible Resolutions to [employer] Focus: Bowel and bladder program, enclosure bed, continue splint, decrease propanolol, monitor BP and HR TID   Continued Need for Acute Rehabilitation Level of Care: The patient requires daily medical management by a physician with specialized training in physical medicine and rehabilitation for the following reasons: Direction of a multidisciplinary physical rehabilitation program to maximize functional independence : Yes Medical management of patient stability for increased activity during participation in an intensive rehabilitation regime.: Yes Analysis of laboratory values and/or radiology reports with any subsequent need for medication adjustment and/or medical intervention. : Yes   I attest that I was present, lead the team conference, and concur with the assessment and plan of the team.   Cristi Loron 06/23/2020, 1:47 PM
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
## 2020-06-23 NOTE — Progress Notes (Signed)
Physical Therapy Session Note  Patient Details  Name: Beth [name] MRN: [id number] Date of Birth: Jun 16, 1986  Today's Date: 06/23/2020 PT Individual Time: 1100-1158 PT Individual Time Calculation (min): 58 min   Short Term Goals: Week 2:  PT Short Term Goal 1 (Week 2): Patient will perform bed mobility with mod A consistently. PT Short Term Goal 2 (Week 2): Patient will participate in functional activities >10 min intervals during therapy sessions. PT Short Term Goal 3 (Week 2): Patient will perform transfers bed<>w/c with mod A consistently. PT Short Term Goal 4 (Week 2): Patient will ambulate >50 ft with max +2 assist.  Skilled Therapeutic Interventions/Progress Updates:     Pt received supine in posey bed, agreeable to therapy with minimal encouragement. Pt indicates discomfort in abdomen. Abdominal brace appears to be source of discomfort. Brace is loosened slightly and pt appears to be more comfortable. Supine to sit with modA +2.Marland Kitchen PT provides maxA to don shoes while seated at edge of bed. Stand pivot transfer to tilt-in-space WC with modA +2. WC transport to gym for time management and energy conservation. PT attempts to have pt perform BITS visual scanning for NMR for sitting balance with cognitive overlay. Pt has little patience for BITS, however, so activity adjusted. Pt transfers stand pivot to mat table with modA +2. Pt becoming irritable while seated at edge of mat, repeated "No, no, no!". Pt requesting to rest and so PT direct pt to sit to supine with minA and pt rests for several minutes. PT utilizes distraction and calming tones and pt appears to calm self while lying on mat. Supine to sit with modA +2. Pt continues to be restless while seated. PT and +2 guide pt into standing position with 3 musketeers technique and modA +2. Pt ambulates x10' with modA +2 and +3 WC follow. Pt requesting to sit down consistently throughout ambulation, but following bout, pt presents with calmer mood  and less restlessness. Following seated rest break, pt ambulates additional bout of 20' with same assist level. PT provide manual facilitation of L leg progress and lateral weight shifting, and pt demos improved reciprocal gait pattern. L knee never achieve full extension but no overt buckling either.   ModA +2 for transfer back to posey bed. Pt left supine in posey bed with call bell within reach.  Therapy Documentation Precautions:  Precautions Precautions: Fall, Other (comment) Precaution Comments: Lt radial head fx; PIP fx Rt little finger, and MCP fx Rt ring finger; unrestricted ROM LUE per Dr Doreatha Martin note 10/29 Required Braces or Orthoses: Splint/Cast Splint/Cast: ulnar gutter splint (RUE); Lt elbow bean bag splint for elbow extension;  Lt UE NWB; peg Restrictions Weight Bearing Restrictions: Yes RUE Weight Bearing: Non weight bearing LUE Weight Bearing: Non weight bearing   Therapy/Group: Individual Therapy  Breck Coons, PT, DPT 06/23/2020, 4:08 PM

## 2020-06-23 NOTE — Progress Notes (Signed)
Speech Language Pathology Daily Session Note  Patient Details  Name: Beth [name] MRN: [id number] Date of Birth: 1985-10-16  Today's Date: 06/23/2020 SLP Individual Time: 1400-1455 SLP Individual Time Calculation (min): 55 min  Short Term Goals: Week 2: SLP Short Term Goal 1 (Week 2): Patient will consume trials of Dys. 1 textures and thin liquids with minimal overt s/s of aspiration over 3 sessions to asses readiness for MBS. SLP Short Term Goal 2 (Week 2): Patient will demonstrate sustained attention to a functional task for ~2 minutes with Max verbal and tactile cues for redirection. SLP Short Term Goal 3 (Week 2): Patient will answer basic yes/no questions in regards to wants/needs in 50% of opportunities. SLP Short Term Goal 4 (Week 2): Patient will participate in 2 functional tasks within a session in 50% of opportunities with Max verbal cues. SLP Short Term Goal 5 (Week 2): Patient will follow basic commands in 50% of opportunities with Max verbal cues.  Skilled Therapeutic Interventions: Skilled treatment session focused on cognitive and dysphagia goals. Upon arrival, patient was restless while sitting in the TIS wheelchair with her fiance present. Patient has been incontinent and had soaked through her brief with urine on the floor. Patient perseverative on "please help me." Patient required Total A for the transfer to the enclosure bed due to impulsivity. Total A was also provided for peri care and donning a new brief and gown, however, patient followed commands in 50% of opportunities. Patient given ~10 minutes to rest quietly in the enclosure bed in order to reduce restlessness. After a brief rest break, patient was given pain medication and sat EOB with Total A. Patient consumed several ice chips and self-feed ~5 bites of Dys. 1 textures without overt s/s of aspiration. Recommend ongoing trials with SLP. Patient without agitation this session and perseverative on "I love you sister"  throughout session. Patient left secured in enclosure bed with all needs within reach. Continue with current plan of care.      Pain Pain Assessment Pain Scale: Faces Pain Score: 0-No pain Faces Pain Scale: Hurts whole lot Pain Type: Acute pain;Surgical pain Pain Location: Wrist Pain Orientation: Right Pain Descriptors / Indicators: Grimacing;Moaning;Restless Pain Frequency: Intermittent Pain Intervention(s): Medication (See eMAR) Multiple Pain Sites: No  Therapy/Group: Individual Therapy  Elisah Parmer, [place] 06/23/2020, 3:02 PM

## 2020-06-23 NOTE — Progress Notes (Signed)
Patient monitor closely throughout shift, aroused at intervals, cognitive statues unchanged non verbal to questions, moaning at intervals. MAE"s x4, coloration adequate.Remain NPO, Peg site CDI,tolerate feeding, flushes and medications well.VS monitor, incontinent care Q2 hrs provided with skin care.

## 2020-06-24 ENCOUNTER — Inpatient Hospital Stay (HOSPITAL_COMMUNITY): Payer: Medicaid Other | Admitting: Occupational Therapy

## 2020-06-24 LAB — GLUCOSE, CAPILLARY
Glucose-Capillary: 100 mg/dL — ABNORMAL HIGH (ref 70–99)
Glucose-Capillary: 123 mg/dL — ABNORMAL HIGH (ref 70–99)
Glucose-Capillary: 147 mg/dL — ABNORMAL HIGH (ref 70–99)
Glucose-Capillary: 156 mg/dL — ABNORMAL HIGH (ref 70–99)
Glucose-Capillary: 84 mg/dL (ref 70–99)
Glucose-Capillary: 92 mg/dL (ref 70–99)

## 2020-06-24 MED ORDER — QUETIAPINE FUMARATE 25 MG PO TABS
25.0000 mg | ORAL_TABLET | Freq: Two times a day (BID) | ORAL | Status: DC | PRN
  Administered 2020-06-26: 25 mg via ORAL
  Filled 2020-06-24: qty 1

## 2020-06-24 MED ORDER — MAGNESIUM CITRATE PO SOLN: 1.0000 | Freq: Once | ORAL | Status: DC

## 2020-06-24 NOTE — Progress Notes (Signed)
Late entry Patient resting upon rounding with no acute distress noted. Fiance at bedside patient was able to identify and states fiance's name also answer questions appropriately related to pain and her name. Continue to monitor, sleep chart initiated, Tele-sitter and safety restraint monitoring continue with Enclosure bed. No acute distress noted at this time

## 2020-06-24 NOTE — Progress Notes (Signed)
Physical Therapy Session Note  Patient Details  Name: Beth [name] MRN: [id number] Date of Birth: 1986/05/22  Today's Date: 06/24/2020 PT Individual Time: 0916-1001 PT Individual Time Calculation (min): 45 min   Short Term Goals: Week 2:  PT Short Term Goal 1 (Week 2): Patient will perform bed mobility with mod A consistently. PT Short Term Goal 2 (Week 2): Patient will participate in functional activities >10 min intervals during therapy sessions. PT Short Term Goal 3 (Week 2): Patient will perform transfers bed<>w/c with mod A consistently. PT Short Term Goal 4 (Week 2): Patient will ambulate >50 ft with max +2 assist.  Skilled Therapeutic Interventions/Progress Updates:     Pt received seated in [facility], working with OT and RN staff present to provide medications. Pt seated in Tilt in space WC at sink performing ADLs. Pt presenting at [place] and Agitated, and requires frequent redirection to task and calming techniques, due to pt tendency to perseverate and become restless and irritable, as is expected with progression of brain healing. Pt responds well to automatic and functional tasks, such as "Let's walk to the bed". During session, pt ambulates multiple bouts with 3 musketeers technique, with maxA +2 and max manual facilitation of L leg clearance and progression, with blocking of knee during stance phase. +3 WC follow utilized for ambulation outside room. Pt ambulates bouts of 15', 100', and 100' with extended seated rest breaks.   Pt performs NMR for activity tolerance with cognitive overlay, performing peg board pattern creation based on photographs. Pt requires max verbal cues to attend to task but is able to complete activity with moderate accuracy and increased time to complete.   Pt performs toilet transfer with modA +2 and requires periodic modA +1 to maintain sitting balance while on toilet.  Pt performs sit<>supine during session with maxA+2. Following session  pt appears very fatigued and left supine in Posey bed with call bell within reach.  Therapy Documentation Precautions:  Precautions Precautions: Fall, Other (comment) Precaution Comments: Lt radial head fx; PIP fx Rt little finger, and MCP fx Rt ring finger; unrestricted ROM LUE per Dr Doreatha Martin note 10/29 Required Braces or Orthoses: Splint/Cast Splint/Cast: ulnar gutter splint (RUE); Lt elbow bean bag splint for elbow extension;  Lt UE NWB; peg Restrictions Weight Bearing Restrictions: Yes RUE Weight Bearing: Non weight bearing LUE Weight Bearing: Non weight bearing    Therapy/Group: Individual Therapy  Breck Coons, PT, DPT 06/24/2020, 3:56 PM

## 2020-06-24 NOTE — Progress Notes (Signed)
Occupational Therapy Session Note  Patient Details  Name: Beth [name] MRN: [id number] Date of Birth: 17-Jan-1986  Today's Date: 06/24/2020 OT Individual Time: 3888-2800 OT Individual Time Calculation (min): 15 min    Short Term Goals: Week 1:  OT Short Term Goal 1 (Week 1): Pt will participate 15 mins without attempts to return to supine OT Short Term Goal 1 - Progress (Week 1): Met OT Short Term Goal 2 (Week 1): Pt will engage in bathing with max assist OT Short Term Goal 2 - Progress (Week 1): Progressing toward goal OT Short Term Goal 3 (Week 1): Pt will complete UB dressing with max assist OT Short Term Goal 3 - Progress (Week 1): Progressing toward goal OT Short Term Goal 4 (Week 1): Pt will complete toilet transfer with max assist OT Short Term Goal 4 - Progress (Week 1): Progressing toward goal Week 2:  OT Short Term Goal 1 (Week 2): Pt will consistently participate in 3 grooming tasks during one therapy session OT Short Term Goal 2 (Week 2): Pt will complete toilet transfer with max A of 1 person OT Short Term Goal 3 (Week 2): maintain sitting balance for 3 minutes with mod A of 1 Week 3:   Skilled Therapeutic Interventions/Progress Updates:    1:1 Pt score of 34 on ABS.   Pt very awake with nursing staff when entered the room Pt moving around in the bed and very restless. When approached the bed asked pt if needed to go to the bathroom and pt replied with a yes. Pt was unable to directly ask to be toileted due to decr attention and executive cognition impairment. Pt readily transferred into w/c with min a +2 for safety and transported ot the bathroom; mod A +2 for safety to transfer to the toilet. Pt was able to void of urine and loose bowel as soon as sat. Point of restlessness this am directly related to toileting needs. Pt wanted to assist with hygiene - able to performed front periarea but required total A for backside in standing with +2 A for another caregiver to assist with  the hygiene. Pt pulling bowel into the front with her own method. Pt very verbal in session with perseveration on phrases of "help me."  Pt was aware of time of day (to be morning) but not aware of situation or place. Was aware of her birthday and could identify family members. See physical therapy note for continuation of therapy session.   Therapy Documentation Precautions:  Precautions Precautions: Fall, Other (comment) Precaution Comments: Lt radial head fx; PIP fx Rt little finger, and MCP fx Rt ring finger; unrestricted ROM LUE per Dr Doreatha Martin note 10/29 Required Braces or Orthoses: Splint/Cast Splint/Cast: ulnar gutter splint (RUE); Lt elbow bean bag splint for elbow extension;  Lt UE NWB; peg Restrictions Weight Bearing Restrictions: Yes RUE Weight Bearing: Non weight bearing LUE Weight Bearing: Non weight bearing General:   Vital Signs: Therapy Vitals Pulse Rate: 87 Pain:  did c/o stomach discomfort able to be resolved with toileting  Therapy/Group: Individual Therapy  Willeen [facility] 06/24/2020, 10:13 AM

## 2020-06-24 NOTE — Progress Notes (Signed)
Patient ID: Beth [name], female   DOB: 1986-03-07, 34 y.o.   MRN: [id number]  SW spoke with [name] [phone]) to schedule family meeting. Scheduled for Tuesday, Dec 7 at 11:30am- who will be present is mother, fiance Mortimer Fries, step father Parks Neptune, and aunt Phineas Real.  Loralee Pacas, MSW, [place] Office: [phone] Cell: [phone] Fax: [phone]
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
## 2020-06-24 NOTE — Progress Notes (Signed)
Occupational Therapy Session Note  Patient Details  Name: Beth [name] MRN: [id number] Date of Birth: Nov 13, 1985  Today's Date: 06/24/2020 OT Individual Time: 9211-9417 OT Individual Time Calculation (min): 60 min    Short Term Goals: Week 1:  OT Short Term Goal 1 (Week 1): Pt will participate 15 mins without attempts to return to supine OT Short Term Goal 1 - Progress (Week 1): Met OT Short Term Goal 2 (Week 1): Pt will engage in bathing with max assist OT Short Term Goal 2 - Progress (Week 1): Progressing toward goal OT Short Term Goal 3 (Week 1): Pt will complete UB dressing with max assist OT Short Term Goal 3 - Progress (Week 1): Progressing toward goal OT Short Term Goal 4 (Week 1): Pt will complete toilet transfer with max assist OT Short Term Goal 4 - Progress (Week 1): Progressing toward goal Week 2:  OT Short Term Goal 1 (Week 2): Pt will consistently participate in 3 grooming tasks during one therapy session OT Short Term Goal 2 (Week 2): Pt will complete toilet transfer with max A of 1 person OT Short Term Goal 3 (Week 2): maintain sitting balance for 3 minutes with mod A of 1  Skilled Therapeutic Interventions/Progress Updates:    1:1. Pt received in bed agreeable to OT. Pt tolerates OT buddy taping RUE 4th and 5th digits. Pt completes mod-MAX +2 sqaut pivot/stand pivot transfers EOB<>TIS<>BSC/EOM. Pt with continent urine void into toilet but attempting to wipe as pt begins to urinate requeing A to dry hands. Pt washes hands at sink with MIN A for incorporating RUE. Pt completes oral care at sink with mod VC for sequencing steps/appropriately spitting into sink. Pt sits EOM (after total A to assume sitting from lying on mat after transfer) and selects blue leggos from bucket with max VC/encouragement for participation. Pt requires rest breaks for full patient participation and almost completely accurate till last leggo, where pt perseverates on red leggos. Pt sits in TIS with  break in between activities scanning for dots for visual motor skills-9.3 sec reaction time and tracing 6 figures with finger on touch screen covering 55% of the shape. Exited session with pt seated in bed, exit alarm on and call light in reach   Therapy Documentation Precautions:  Precautions Precautions: Fall, Other (comment) Precaution Comments: Lt radial head fx; PIP fx Rt little finger, and MCP fx Rt ring finger; unrestricted ROM LUE per Dr Doreatha Martin note 10/29 Required Braces or Orthoses: Splint/Cast Splint/Cast: ulnar gutter splint (RUE); Lt elbow bean bag splint for elbow extension;  Lt UE NWB; peg Restrictions Weight Bearing Restrictions: Yes RUE Weight Bearing: Non weight bearing LUE Weight Bearing: Non weight bearing General:   Vital Signs: Therapy Vitals Temp: 98.4 F (36.9 C) Pulse Rate: 85 Resp: 13 BP: (!) 127/99 Patient Position (if appropriate): Lying Oxygen Therapy SpO2: 100 % O2 Device: Room Air Pain:   ADL: ADL Eating: NPO Upper Body Bathing: Dependent Where Assessed-Upper Body Bathing: Bed level Lower Body Bathing: Dependent (+2) Where Assessed-Lower Body Bathing: Bed level Upper Body Dressing: Dependent Where Assessed-Upper Body Dressing: Bed level Lower Body Dressing: Dependent Where Assessed-Lower Body Dressing: Bed level Toileting: Dependent Vision   Perception    Praxis   Exercises:   Other Treatments:     Therapy/Group: Individual Therapy  Tonny Branch 06/24/2020, 4:00 PM
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
## 2020-06-24 NOTE — Progress Notes (Addendum)
Speech Language Pathology Daily Session Note  Patient Details  Name: Beth [name] MRN: [id number] Date of Birth: 1986-05-11  Today's Date: 06/24/2020 SLP Individual Time: 1150-1240 SLP Individual Time Calculation (min): 50 min  Short Term Goals: Week 2: SLP Short Term Goal 1 (Week 2): Patient will consume trials of Dys. 1 textures and thin liquids with minimal overt s/s of aspiration over 3 sessions to asses readiness for MBS. SLP Short Term Goal 2 (Week 2): Patient will demonstrate sustained attention to a functional task for ~2 minutes with Max verbal and tactile cues for redirection. SLP Short Term Goal 3 (Week 2): Patient will answer basic yes/no questions in regards to wants/needs in 50% of opportunities. SLP Short Term Goal 4 (Week 2): Patient will participate in 2 functional tasks within a session in 50% of opportunities with Max verbal cues. SLP Short Term Goal 5 (Week 2): Patient will follow basic commands in 50% of opportunities with Max verbal cues.  Skilled Therapeutic Interventions: Skilled treatment session focused on dysphagia and cognitive goals. Upon arrival, patient was awake while in the enclosure bed. Patient reported she did not need to use the bathroom but was transferred to the commode with total +2 assist for safety for timed toileting. Patient was continent of bladder. SLP facilitated session by providing trials of thin liquids via straw. Patient demonstrated poor awareness of the bolus but did not demonstrate any overt s/s of aspiration. Patient declined trials of pureed textures. Recommend ongoing trials with SLP only. Patient with verbal agitation throughout session by frequently yelling out "no" and threatening to punch clinician (no attempts made). Patient also required total hand over hand assist for initiation of a basic calendar task and Max verbal cues for initiation of basic self-care tasks such as applying lotion and chapstick. Patient transferred back to  enclosure bed due to fatigue, patient missed remaining 10 minutes of session. Continue with current plan of care.      Pain No/Denies Pain   Therapy/Group: Individual Therapy  [name] 06/24/2020, 3:12 PM

## 2020-06-25 ENCOUNTER — Inpatient Hospital Stay (HOSPITAL_COMMUNITY): Payer: Medicaid Other | Admitting: Occupational Therapy

## 2020-06-25 ENCOUNTER — Inpatient Hospital Stay (HOSPITAL_COMMUNITY): Payer: Medicaid Other | Admitting: Speech Pathology

## 2020-06-25 LAB — GLUCOSE, CAPILLARY
Glucose-Capillary: 111 mg/dL — ABNORMAL HIGH (ref 70–99)
Glucose-Capillary: 178 mg/dL — ABNORMAL HIGH (ref 70–99)
Glucose-Capillary: 89 mg/dL (ref 70–99)
Glucose-Capillary: 90 mg/dL (ref 70–99)
Glucose-Capillary: 94 mg/dL (ref 70–99)

## 2020-06-25 MED ORDER — METHYLPHENIDATE HCL 5 MG PO TABS
10.0000 mg | ORAL_TABLET | Freq: Two times a day (BID) | ORAL | Status: DC
  Administered 2020-06-25 – 2020-07-03 (×16): 10 mg
  Filled 2020-06-25 (×16): qty 2
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
## 2020-06-25 NOTE — Progress Notes (Signed)
Physical Therapy Session Note 10 min missed time due to fatigue  Patient Details  Name: Beth [name] MRN: [id number] Date of Birth: [birth year]  Today's Date: 06/25/2020 PT Individual Time: 8563-1497 PT Individual Time Calculation (min): 50 min   Short Term Goals: Week 1:  PT Short Term Goal 1 (Week 1): Patient will participate in meangingful therapy for >5 mins PT Short Term Goal 1 - Progress (Week 1): Met PT Short Term Goal 2 (Week 1): Patient will roll R/L with CGA consistently PT Short Term Goal 2 - Progress (Week 1): Progressing toward goal PT Short Term Goal 3 (Week 1): Patient will transition supine <> sit with MaxA x1 PT Short Term Goal 3 - Progress (Week 1): Met PT Short Term Goal 4 (Week 1): Patient will transfer bed<> wc MaxA x1 with LRAD PT Short Term Goal 4 - Progress (Week 1): Partly met (Inconsitent max A +1 to mod A +2) Week 2:  PT Short Term Goal 1 (Week 2): Patient will perform bed mobility with mod A consistently. PT Short Term Goal 2 (Week 2): Patient will participate in functional activities >10 min intervals during therapy sessions. PT Short Term Goal 3 (Week 2): Patient will perform transfers bed<>w/c with mod A consistently. PT Short Term Goal 4 (Week 2): Patient will ambulate >50 ft with max +2 assist. Week 3:     Skilled Therapeutic Interventions/Progress Updates:    PAIN pt does not c/o pain but impaired cognition/communication impede ability to fully assess.  No apparent pain during session.  Pt initially awake, lying in Posey Bed w/staff in room.  Pt lifts L foot for therapist to don shoe, dependent on R.  Pt supine to sit w/max assist to initiate/facilitate transition.   Sit to stand w/max assist of 2/poor initiation by pt. Gait 70f to commode w/max of 2, total assist to advance and stabilize RLE, max assist for wt shifting, max cues for attention to task.  Pt very impulsive/attempts to lean forward and sit to commode prior to reaching commode.    Dependent for clothing management.   Pt incontinent in brief but continent of urine on commode.  Pt able to perform hygiene in sitting w/set up w/washcloths/dependent for post perinium in squat, min to max assist for sitting balance on commode. Max assist when squatting. Gait 15 ft to sink as above. Stood at sink to wash and dry hands w/max of 2, set up, max cues to attend to L hand and for sequencing. Stand to sit w/max of 2 for safety due to impulsivity/poor midline.  Pt transported to quiet hall for attempted gait training.  Mult efforts/pt adamantly refuses. Pt transported to ADL room. STS at kitchen counter and standing w/mod assist, briefly engaged in transferring plastic fruit to bowl using RUE Pt not cooperative w/performing in standing. Alternated to seated activity w/pt choosing numbered cards 1-5 in sequence then w/cards placed in random, also able to identify/select numbers placed in random.  Pt then refused to advance w/activity when matching of cards presented.  Gait: 358fx 2 w/max assist of 2 and third person assist for safety/chair management due to impulsive tendency for sitting during activity.  Pt reoriented to date/place/situation throughout session.  Pt transported to hallway outside room.  Gait x 2047fo bed as described above.  In sitting w/min to mod assist for safety, pt able to kick off R shoe, refused to assist w/l shoe even when LLE placed in fig 4 for access.  Sit to supine  w/mod assist for safety/guarding. At end of session, pt secured in Poseybed.  1505: Attempted to makeup session at end of day, pt w/OT at this time.   Therapy Documentation Precautions:  Precautions Precautions: Fall, Other (comment) Precaution Comments: Lt radial head fx; PIP fx Rt little finger, and MCP fx Rt ring finger; unrestricted ROM LUE per Dr Doreatha Martin note 10/29 Required Braces or Orthoses: Splint/Cast Splint/Cast: ulnar gutter splint (RUE); Lt elbow bean bag splint for elbow  extension;  Lt UE NWB; peg Restrictions Weight Bearing Restrictions: Yes RUE Weight Bearing: Non weight bearing LUE Weight Bearing: Non weight bearing    Therapy/Group: Individual Therapy  Callie Fielding, PT   Jerrilyn Cairo 06/25/2020, 12:36 PM

## 2020-06-25 NOTE — Progress Notes (Signed)
Occupational Therapy Session Note  Patient Details  Name: Beth [name] MRN: [id number] Date of Birth: 1985/12/02  Today's Date: 06/25/2020 OT Individual Time: 1415-1530 OT Individual Time Calculation (min): 75 min    Short Term Goals: Week 2:  OT Short Term Goal 1 (Week 2): Pt will consistently participate in 3 grooming tasks during one therapy session OT Short Term Goal 2 (Week 2): Pt will complete toilet transfer with max A of 1 person OT Short Term Goal 3 (Week 2): maintain sitting balance for 3 minutes with mod A of 1  Skilled Therapeutic Interventions/Progress Updates:   Patient greeted supine in enclosure bed, awake and agreeable to therapy. Pt agreeable to go to the bathroom. Pt came to sitting EOB with min A. Mod/max A for dynamic sitting balance while trying to don shoes. Mod A of 1 stand-pivot to TIS wc. Pt completed stand-pivot to toilet with mod A, but then needed +2 for clothing management. Pt voided bladder successfully and continently in commode. She was then able to initiate and complete peri-area with set-up A. Pt stated "can I have some toilet paper?" Pt brought to the sink to wash hands with min cues to integrate L UE into grooming task. Pt able to sequence activity by getting soap, then asking for a paper towel. Pt brought to therapy gym and attempted sit<>stand while also participating in color sorting task. Pt too distracted with dual task. Pt returned to sitting and was able to correctly locate colors and patterns with min cues. Also integrated L visual scanning and L attention. OT attempted to place E-stim on wrist extensors, but pt could not leave it alone on her arm. Pt fatigued and requested to return to the room. Pt pivoted back to bed impuslively with mod of 1 and +2 for safety. OT had pt come to sidelying and pt was able to follow commands and shrug her L shoulder in gravity eliminated position! OT provided gentle ROM to L elbow as well. OT played christmas music for  patient and encouraged her to sing along. Pt was able to recall lyrics to a few christmas songs, but would only say these lyrics and not sing them. OT turned classical music on for patient and patient left in enclosure bed with needs met.    Therapy Documentation Precautions:  Precautions Precautions: Fall, Other (comment) Precaution Comments: Lt radial head fx; PIP fx Rt little finger, and MCP fx Rt ring finger; unrestricted ROM LUE per Dr Doreatha Martin note 10/29 Required Braces or Orthoses: Splint/Cast Splint/Cast: ulnar gutter splint (RUE); Lt elbow bean bag splint for elbow extension;  Lt UE NWB; peg Restrictions Weight Bearing Restrictions: No RUE Weight Bearing: Non weight bearing LUE Weight Bearing: Non weight bearing Pain:  no pain reported   Therapy/Group: Individual Therapy  Valma Cava 06/25/2020, 3:24 PM

## 2020-06-25 NOTE — Progress Notes (Signed)
Speech Language Pathology Weekly Progress and Session Note  Patient Details  Name: Hellon Vaccarella MRN: [id number] Date of Birth: 08-04-1985  Beginning of progress report period: June 18, 2020 End of progress report period: June 25, 2020  Today's Date: 06/25/2020 SLP Individual Time: 1100-1150 SLP Individual Time Calculation (min): 50 min  Short Term Goals: Week 2: SLP Short Term Goal 1 (Week 2): Patient will consume trials of Dys. 1 textures and thin liquids with minimal overt s/s of aspiration over 3 sessions to asses readiness for MBS. SLP Short Term Goal 1 - Progress (Week 2): Not met SLP Short Term Goal 2 (Week 2): Patient will demonstrate sustained attention to a functional task for ~2 minutes with Max verbal and tactile cues for redirection. SLP Short Term Goal 2 - Progress (Week 2): Not met SLP Short Term Goal 3 (Week 2): Patient will answer basic yes/no questions in regards to wants/needs in 50% of opportunities. SLP Short Term Goal 3 - Progress (Week 2): Met SLP Short Term Goal 4 (Week 2): Patient will participate in 2 functional tasks within a session in 50% of opportunities with Max verbal cues. SLP Short Term Goal 4 - Progress (Week 2): Met SLP Short Term Goal 5 (Week 2): Patient will follow basic commands in 50% of opportunities with Max verbal cues. SLP Short Term Goal 5 - Progress (Week 2): Not met    New Short Term Goals: Week 3: SLP Short Term Goal 1 (Week 3): Patient will answer basic yes/no questions in regards to wants/needs in 75% of opportunities. SLP Short Term Goal 2 (Week 3): Patient will consume trials of Dys. 1 textures and thin liquids with minimal overt s/s of aspiration over 3 sessions to asses readiness for MBS. SLP Short Term Goal 3 (Week 3): Patient will demonstrate focused attention to a functional task for ~60 seconds with Max verbal and tactile cues for redirection. SLP Short Term Goal 4 (Week 3): Patient will participate in 3 functional tasks  within a 60 minute session in 75% of opportunities with Max verbal cues. SLP Short Term Goal 5 (Week 3): Patient will follow basic commands in 50% of opportunities with Max verbal cues.  Weekly Progress Updates: Patient continues to make functional gains and has met 2 of 5 STGs this reporting period. Patient is demonstrating behaviors consistent with a Rancho Level IV and continues to require overall Max-Total A to complete functional and familiar tasks safely in regards to orientation, initiation and attention with inconsistent restlessness, fatigue and verbal agitation. However, patient is more awake this week with increased ability to participate in functional tasks and increased verbal expression. Patient has been consuming trials of Dys. 1 textures with thin liquids without overt s/s of aspiration but continues to demonstrate poor awareness of bolus and current cognitive functioning prevents patient from being able to participate efficiently in an MBS at this time. Family education ongoing. Patient would benefit from continued skilled SLP intervention to maximize her cognitive and swallowing function prior to discharge.      Intensity: Minumum of 1-2 x/day, 30 to 90 minutes Frequency: 3 to 5 out of 7 days Duration/Length of Stay: 07/11/20 Treatment/Interventions: Cognitive remediation/compensation;Cueing hierarchy;Environmental controls;Functional tasks;Internal/external aids;Dysphagia/aspiration precaution training;Speech/Language facilitation;Patient/family education   Daily Session  Skilled Therapeutic Interventions: Skilled treatment session focused on dysphagia and cognitive goals as well as family education with the patient's mother. Upon arrival, patient was awake while supine in bed. Patient required Max A to transfer to the wheelchair. Patient performed oral care,  donned chapstick and washed her face with set-up assist. Patient also consumed trials of thin liquids via straw with only one  episode of coughing out of ~8 sips. Patient consumed minimal bites of Dys. 1 textures without overt s/s of aspiration. Patient able to identify coins from a field of 4 with 100% accuracy and encouragement. Patient reporting fatigue and became increasingly frustrated when her request was not met to get back into bed. Patient transferred to the commode with +2 assist for safety and was continent of urine. Patient's mother present towards end of session and educated regarding patient's current cognitive functioning and goals of skilled SLP intervention. She verbalized understanding and asked appropriate questions. Patient left secure in enclosure bed. Continue with current plan of care.     Pain Pain Assessment Pain Score: 0-No pain  Therapy/Group: Individual Therapy  Drucilla Cumber 06/25/2020, 6:42 AM

## 2020-06-26 ENCOUNTER — Inpatient Hospital Stay (HOSPITAL_COMMUNITY): Payer: Medicaid Other | Admitting: Occupational Therapy

## 2020-06-26 ENCOUNTER — Inpatient Hospital Stay (HOSPITAL_COMMUNITY): Payer: Medicaid Other

## 2020-06-26 LAB — GLUCOSE, CAPILLARY
Glucose-Capillary: 111 mg/dL — ABNORMAL HIGH (ref 70–99)
Glucose-Capillary: 168 mg/dL — ABNORMAL HIGH (ref 70–99)
Glucose-Capillary: 81 mg/dL (ref 70–99)
Glucose-Capillary: 93 mg/dL (ref 70–99)
Glucose-Capillary: 93 mg/dL (ref 70–99)

## 2020-06-26 MED ORDER — QUETIAPINE FUMARATE 25 MG PO TABS
25.0000 mg | ORAL_TABLET | Freq: Two times a day (BID) | ORAL | Status: DC | PRN
  Administered 2020-06-27 – 2020-07-05 (×5): 25 mg
  Filled 2020-06-26 (×5): qty 1

## 2020-06-26 MED ORDER — MAGNESIUM CITRATE PO SOLN
1.0000 | Freq: Once | ORAL | Status: AC
  Administered 2020-07-03: 1
  Filled 2020-06-26: qty 296
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
## 2020-06-26 NOTE — Progress Notes (Signed)
Patient continues to sleep peacefully, respirations even and unlabored, no distress noted.

## 2020-06-26 NOTE — Progress Notes (Signed)
Occupational Therapy Weekly Progress Note  Patient Details  Name: Beth [name] MRN: [id number] Date of Birth: 1986/06/26  Beginning of progress report period: June 12, 2020 End of progress report period: June 26, 2020  Today's Date: 06/26/2020 OT Individual Time: 1100-1203 OT Individual Time Calculation (min): 63 min    Patient has met 3 of 3 short term goals.  Patient has made steady progress towards OT goals this week. Patient continues to need +2 assistance for safety, but she can transfer with as little as mod A of 1 person. Patient has been more alert this week allowing her to participate more consistently with therapies. Patient is tolerating sitting EOB and Edge of Mat with improved overall sitting balance. Pt has also demonstrated improved sequencing and initiation within BADL tasks. Continue current POC.  Patient continues to demonstrate the following deficits: muscle weakness and muscle joint tightness, impaired timing and sequencing, abnormal tone, unbalanced muscle activation, motor apraxia, ataxia, decreased coordination and decreased motor planning, , decreased midline orientation, decreased attention to left and left side neglect, decreased initiation, decreased attention, decreased awareness, decreased problem solving, decreased safety awareness, decreased memory and delayed processing and decreased sitting balance, decreased standing balance, decreased postural control, hemiplegia and decreased balance strategies and therefore will continue to benefit from skilled OT intervention to enhance overall performance with BADL and Reduce care partner burden.  Patient progressing toward long term goals..  Continue plan of care.  OT Short Term Goals Week 2:  OT Short Term Goal 1 (Week 2): Pt will consistently participate in 3 grooming tasks during one therapy session OT Short Term Goal 1 - Progress (Week 2): Met OT Short Term Goal 2 (Week 2): Pt will complete toilet transfer  with max A of 1 person OT Short Term Goal 2 - Progress (Week 2): Met OT Short Term Goal 3 (Week 2): maintain sitting balance for 3 minutes with mod A of 1 OT Short Term Goal 3 - Progress (Week 2): Met Week 3:  OT Short Term Goal 1 (Week 3): Patient will tolerate seated activity for 10 minutes OT Short Term Goal 2 (Week 3): Pt will pull up pants with mod A for balance OT Short Term Goal 3 (Week 3): Pt will complete self-ROM with max verbal and tactile cues  Skilled Therapeutic Interventions/Progress Updates:    Patient greeted semi-reclined in bed with fiance present, however, he left for therapy session. Pt perseverating on "being a strong woman" and missing her sisters today. Pt agreeable to transfer to wc with encouragement. Mod A to come to sitting EOB, then impulsive to stand, requiring +2 for safety as OT kept  Pt seated EOB, while rehab tech assisted with donning shoes. Pt then able to pivot to wc with mod A. Pt brought down th therapy gym and completed stand-pivot to therapy mat with mod A/ Worked on sitting balance on edge of mat with mirror feedback to bring pt to midline. OT used dry erase marker and had patient visually scan and locate letters, then erase with towel. Pt was able to get 90% of the letters correct, but needed cues to scan to L side. Pt tolerated sitting up on mat without trying to lay down for 15 mins today! Educated on self-ROM, but patient too distracted to follow commands. OT brought pt into sidelying for L UU NMR. Patienet with slight shoulder acivation noted, but difficulty maintaining attention to focus on L UE. Worked on counting while performing shoulder flex/ext and elbow flex/ext to tolerance.  Functional ambulation with +2 assist 20 feet in gym. Patient returned to room and completed toilet transfer with mod A and +2 for clothing management. Patient voided bladder and completed peri-care with set-up and Mod A for sitting balance. Stand-pivot back to wc>bed> mod A of 1. Pt  left semi-reclined in enclosure bed with needs met.   Therapy Documentation Precautions:  Precautions Precautions: Fall, Other (comment) Precaution Comments: Lt radial head fx; PIP fx Rt little finger, and MCP fx Rt ring finger; unrestricted ROM LUE per Dr Doreatha Martin note 10/29 Required Braces or Orthoses: Splint/Cast Splint/Cast: ulnar gutter splint (RUE); Lt elbow bean bag splint for elbow extension;  Lt UE NWB; peg Restrictions Weight Bearing Restrictions: No RUE Weight Bearing: Non weight bearing LUE Weight Bearing: Non weight bearing Pain: Pain Assessment Pain Score: 0-No pain   Therapy/Group: Individual Therapy  Valma Cava 06/26/2020, 11:56 AM

## 2020-06-26 NOTE — Progress Notes (Signed)
Patient sleeping with no distress noted.

## 2020-06-26 NOTE — Progress Notes (Addendum)
Physical Therapy Session Note  Patient Details  Name: Beth [name] MRN: [id number] Date of Birth: 03/21/1986  Today's Date: 06/26/2020 PT Individual Time: 0900-1000 and 215-315 PT Individual Time Calculation (min): 60 min and 60 min  Short Term Goals: Week 1:  PT Short Term Goal 1 (Week 1): Patient will participate in meangingful therapy for >5 mins PT Short Term Goal 1 - Progress (Week 1): Met PT Short Term Goal 2 (Week 1): Patient will roll R/L with CGA consistently PT Short Term Goal 2 - Progress (Week 1): Progressing toward goal PT Short Term Goal 3 (Week 1): Patient will transition supine <> sit with MaxA x1 PT Short Term Goal 3 - Progress (Week 1): Met PT Short Term Goal 4 (Week 1): Patient will transfer bed<> wc MaxA x1 with LRAD PT Short Term Goal 4 - Progress (Week 1): Partly met (Inconsitent max A +1 to mod A +2) Week 2:  PT Short Term Goal 1 (Week 2): Patient will perform bed mobility with mod A consistently. PT Short Term Goal 2 (Week 2): Patient will participate in functional activities >10 min intervals during therapy sessions. PT Short Term Goal 3 (Week 2): Patient will perform transfers bed<>w/c with mod A consistently. PT Short Term Goal 4 (Week 2): Patient will ambulate >50 ft with max +2 assist. Week 3:     Skilled Therapeutic Interventions/Progress Updates:    AM SESSION PAIN  Denies pain but fiddles w/R hand splint   Pt initially awake in [facility] bed.  Agreeable to getting up and going to BR.  Hand splint applied by therapist. Supine to sit w/min assist, mod assist w/sitting balance due to impulsivity/safety. Dons shoes w/max assist Sit to stand w/mod +2 Gait 26f to commode w/max assist of 2, commode transfer max assist of 1. Pt continent of urine and performs hygiene w/set up assist, min assist for balance. In sitting pt dons long sleeved shirt w/max cues for sequencing and for LUE/attention to L. Max assist for pants/attention to L, mod assist for sitting  balance. Sit to stand from commode w/mod assist and total assist to raise pants.  Gait 122fto sink/stood briefly to wash/dry hands w/max cues to attend to L hand. Sits to wc impulsively/max assist for safety. Pt transported to day room for continued session.  Pt perseverating on feeling lonely/missing mother/calling out for her mother to help her throughout session.  Attempted to redirect pt frequently.  In sitting and standing pt worked on placing randomly numbered cards from 0-9 into order.  Pt participated approx [id number] self distraction w/feeling lonely/missing family/difficult to redirect. Gait trials as follows: [id number], [id number] w/max assist of 2, max assist for wt shifting/advancement of LLE/stabilization of LLE w/stance. Husband provided wc follow and encouraged pt to continue w/gait on last trial which resulted in signficantly improved tolerance/attention to task.  At end of session, pt left oob in wc w/husband at bedside.  wc tilted and seatbelt secured for safety.   PM SESSION PAIN  No c/o Pt initially calm and quiet resting in bed. Able to raise bilat feet for donning of shoes, additional tactile and verbal cues for L. Supine to sit w/min assist. Stand pivot transfer bed to wc to L w/max assist, pt fails to advance LLE w/transfer. Pt transported to ortho gym for standing activity Pt Sit to stand at standing frame w/min assist.  Harness elevated and used to deter spontaneous sitting during activity.  Pt did attempt to sit x  1 but realized harness prevented this and then continued w/standing activity. In standing pt engaged in simple foam block puzzle w/frequent cues to attend to diagram and choose/properly place pieces.  Accuracy approx 25%.  Hand over hand assist to stabilize pieces w/L hand while placing/securing w/R.   Engaged x 3-4 min before seated rest and repeating task w/second simple puzzle.  wc propulsion using bilat feet and max verbal cues, min tactile cues for  use of LLE 15f x 2.  Pt attempting Sit to stand therefore transitioned to gait as follows:  Gait 2148fw/max assist of 1, mod assist of 1, assist for advancement and stabilization of LLE, wt shifting, trunk control.   Pt transferred to edge of bed as above, min assist sit to supine, performs w/poor safety awarness but lifts LLE into bed without assist.  At end of session, pt wrestless, attempting to unzip bed, requesting assist to get OOB.  Pt provided w/busy board.    Therapy Documentation Precautions:  Precautions Precautions: Fall, Other (comment) Precaution Comments: Lt radial head fx; PIP fx Rt little finger, and MCP fx Rt ring finger; unrestricted ROM LUE per Dr HaDoreatha Martinote 10/29 Required Braces or Orthoses: Splint/Cast Splint/Cast: ulnar gutter splint (RUE); Lt elbow bean bag splint for elbow extension;  Lt UE NWB; peg Restrictions Weight Bearing Restrictions: No RUE Weight Bearing: Non weight bearing LUE Weight Bearing: Non weight bearing   Therapy/Group: Individual Therapy  BaCallie FieldingPT   BaJerrilyn Cairo2/09/2019, 12:57 PM

## 2020-06-26 NOTE — Progress Notes (Signed)
Pt slept well through the night, only awakening for care.
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
## 2020-06-26 NOTE — Progress Notes (Signed)
Patient increasingly agitated, attempting to get out of enclosure bed and removing clothes stating repetitively that she needs to use the phone. Attempted to toilet pt on BSC, but patient didn't go.  Patient hot and sweating, removed sweatsuit and placed patient in a lighter top to decrease heat. Patient given prn seroquel via PEG and assisted back to enclosure bed.  Will continue to monitor patient.

## 2020-06-27 LAB — GLUCOSE, CAPILLARY
Glucose-Capillary: 102 mg/dL — ABNORMAL HIGH (ref 70–99)
Glucose-Capillary: 103 mg/dL — ABNORMAL HIGH (ref 70–99)
Glucose-Capillary: 111 mg/dL — ABNORMAL HIGH (ref 70–99)
Glucose-Capillary: 80 mg/dL (ref 70–99)
Glucose-Capillary: 90 mg/dL (ref 70–99)
Glucose-Capillary: 93 mg/dL (ref 70–99)

## 2020-06-27 MED ORDER — PROPRANOLOL HCL 10 MG PO TABS
10.0000 mg | ORAL_TABLET | Freq: Two times a day (BID) | ORAL | Status: DC
  Administered 2020-06-27 – 2020-07-06 (×18): 10 mg
  Filled 2020-06-27 (×18): qty 1

## 2020-06-27 NOTE — Progress Notes (Signed)
Bedside report at shift change found fiancee with patient talking to their children on the phone. No distress noted. During shift both Mom and Dad called to check patient status. Writer updated parents on patient's current condition; she tolerated medication administration and was resting quietly, respirations even unlabored.Marland Kitchen
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
## 2020-06-28 LAB — GLUCOSE, CAPILLARY
Glucose-Capillary: 114 mg/dL — ABNORMAL HIGH (ref 70–99)
Glucose-Capillary: 86 mg/dL (ref 70–99)
Glucose-Capillary: 77 mg/dL (ref 70–99)
Glucose-Capillary: 114 mg/dL — ABNORMAL HIGH (ref 70–99)
Glucose-Capillary: 88 mg/dL (ref 70–99)
Glucose-Capillary: 117 mg/dL — ABNORMAL HIGH (ref 70–99)
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
## 2020-06-28 NOTE — Progress Notes (Signed)
Patient was non cooperative when attempting to obtain weight; will notify oncoming staff and attempt to obtain weight when patient is more alert.

## 2020-06-29 ENCOUNTER — Inpatient Hospital Stay (HOSPITAL_COMMUNITY): Payer: Medicaid Other

## 2020-06-29 ENCOUNTER — Inpatient Hospital Stay (HOSPITAL_COMMUNITY): Payer: Medicaid Other | Admitting: Speech Pathology

## 2020-06-29 DIAGNOSIS — R7309 Other abnormal glucose: Secondary | ICD-10-CM

## 2020-06-29 LAB — GLUCOSE, CAPILLARY
Glucose-Capillary: 134 mg/dL — ABNORMAL HIGH (ref 70–99)
Glucose-Capillary: 182 mg/dL — ABNORMAL HIGH (ref 70–99)
Glucose-Capillary: 76 mg/dL (ref 70–99)
Glucose-Capillary: 83 mg/dL (ref 70–99)
Glucose-Capillary: 86 mg/dL (ref 70–99)
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
## 2020-06-29 NOTE — Progress Notes (Signed)
Speech Language Pathology Daily Session Note  Patient Details  Name: Beth [name] MRN: [id number] Date of Birth: March 14, 1986  Today's Date: 06/29/2020 SLP Individual Time: 1000-1100 SLP Individual Time Calculation (min): 60 min  Short Term Goals: Week 3: SLP Short Term Goal 1 (Week 3): Patient will answer basic yes/no questions in regards to wants/needs in 75% of opportunities. SLP Short Term Goal 2 (Week 3): Patient will consume trials of Dys. 1 textures and thin liquids with minimal overt s/s of aspiration over 3 sessions to asses readiness for MBS. SLP Short Term Goal 3 (Week 3): Patient will demonstrate focused attention to a functional task for ~60 seconds with Max verbal and tactile cues for redirection. SLP Short Term Goal 4 (Week 3): Patient will participate in 3 functional tasks within a 60 minute session in 75% of opportunities with Max verbal cues. SLP Short Term Goal 5 (Week 3): Patient will follow basic commands in 50% of opportunities with Max verbal cues.  Skilled Therapeutic Interventions: Skilled treatment session focused on dysphagia and cognitive goals. Upon arrival, patient was awake in bed. Patient agreeable to participate and sat EOB independently. Patient requested to use the bathroom and was continent of urine. SLP provided set-up assist and patient brushed her teeth with overall Min verbal cues for problem solving. Patient consumed trials of Dys. 1 textures with thin liquids without overt s/s of aspiration and self-feed 100% of trials with Min visual cues. Recommend ongoing trials with SLP and suspect patient will be cognitively ready to participate in an MBS on Wednesday. Patient requested to get back into bed and required Max-Total A due to impulsivity. Patient was oriented to situation and place (hospital) but required total A for orientation to city and date. Therefore, SLP provided external aids which she was able to utilize with supervision verbal cues. Patient  perseverative on missing her family and feeling lonely, therefore, SLP called her fiance.. She participated in a functional conversation appropriately for ~5 minutes. An external aid was created to help recall family members who visit. Patient requested for SLP to stay and initiated holding the clinician's hand throughout the session. Patient secured in enclosure bed and left with music playing. Continue with current plan of care.      Pain Pain Assessment Pain Scale: 0-10 Pain Score: Asleep Pain Type: Acute pain Pain Location: Head Pain Descriptors / Indicators: Headache Pain Frequency: Intermittent Pain Onset: On-going Patients Stated Pain Goal: 2 Pain Intervention(s): Medication (See eMAR)  Therapy/Group: Individual Therapy  Cherylee Rawlinson 06/29/2020, 12:59 PM

## 2020-06-29 NOTE — Progress Notes (Signed)
Physical Therapy Session Note  Patient Details  Name: Beth [name] MRN: [id number] Date of Birth: Nov 19, 1985  Today's Date: 06/29/2020 PT Individual Time: 1300-1410 PT Individual Time Calculation (min): 70 min   Short Term Goals: Week 3:     Skilled Therapeutic Interventions/Progress Updates:    Pt received supine in bed and agrees to therapy. Father present. Pt agreeable to therapy. Supine to sit with mod +1. Sit to stand and ambulatory transfer to toilet to void urine, requiring modA +2 HHA. Pt performs partial doffing and donning of pants to use toilet and requires modA to complete task. Pt ambulates to sink with modA +2, washes hands in standing prior to sitting rest break. Tilt in space transport to gym for time management. Pt performs sit to stand with light modA +2 and ambulates x150' and +3 WC follow.from husband. PT providing multimodal cues for lateral weight shifting and progressing L leg. Pt responds well to "kick" cues and is able to progress L leg without manual assistance. Pt begins to demonstrate increased trendelenburg gait pattern on L with fatigue, requiring increasing assistance.   Pt performs NMR with standing balance activity with cognitive and tactile overlay. Pt stands at high low table and tasked with spelling 3-letter words using letter tiles. Pt requires min verbal cues to complete 4-5 words, with increased cuing required when necessary letter tile is in L visual field. PT also providing manual facilitation of symmetrical WB, encouraging L weight shift and L knee extension due to pt tendency to maintain majority of WB through right lower extremity.  Pt performs NMR for balance and core strengthening, utilizing high kneeling technique. ModA +2 for hand over hand transfer to mat table, ascending with L leg first. Pt assisted into high kneeling with platform for upper extremity support, and mirror positioned for visual feedback. Pt only able to maintain high kneeling  position for 10-20 seconds at a time with min/modA +2, taking frequent rest breaks in forward leaning position with elbows on platform. Pt demos frequent use of glute activation to transition from forward leaning position to high kneeling. Pt verbalizes fatigue and attempts to transfer off mat table utilizing L leg first but does not have motor control to position L leg effectively. Transfer reattempted with R leg first and completed with modA +2.  Pt ambulates additional 170' with same assist as previous bout of ambulation, but demos increased trendelenburg gait pattern due to fatigue. WC transport back to room. Stand pivot back to bed with modA and left supine in posey bed with call bell within reach.  Therapy Documentation Precautions:  Precautions Precautions: Fall, Other (comment) Precaution Comments: Lt radial head fx; PIP fx Rt little finger, and MCP fx Rt ring finger; unrestricted ROM LUE per Dr Doreatha Martin note 10/29 Required Braces or Orthoses: Splint/Cast Splint/Cast: ulnar gutter splint (RUE); Lt elbow bean bag splint for elbow extension;  Lt UE NWB; peg Restrictions Weight Bearing Restrictions: Yes RUE Weight Bearing: Non weight bearing LUE Weight Bearing: Weight bearing as tolerated    Therapy/Group: Individual Therapy  Breck Coons, PT, DPT 06/29/2020, 1:53 PM
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
## 2020-06-29 NOTE — Progress Notes (Signed)
Patient has been calm and corporative the whole day, no agitation noted.

## 2020-06-29 NOTE — Progress Notes (Incomplete)
Physical Therapy Session Note  Patient Details  Name: Beth [name] MRN: [id number] Date of Birth: June 22, 1986  Today's Date: 06/29/2020 PT Individual Time:  -      Short Term Goals: Week 1:  PT Short Term Goal 1 (Week 1): Patient will participate in meangingful therapy for >5 mins PT Short Term Goal 1 - Progress (Week 1): Met PT Short Term Goal 2 (Week 1): Patient will roll R/L with CGA consistently PT Short Term Goal 2 - Progress (Week 1): Progressing toward goal PT Short Term Goal 3 (Week 1): Patient will transition supine <> sit with MaxA x1 PT Short Term Goal 3 - Progress (Week 1): Met PT Short Term Goal 4 (Week 1): Patient will transfer bed<> wc MaxA x1 with LRAD PT Short Term Goal 4 - Progress (Week 1): Partly met (Inconsitent max A +1 to mod A +2) Week 2:  PT Short Term Goal 1 (Week 2): Patient will perform bed mobility with mod A consistently. PT Short Term Goal 2 (Week 2): Patient will participate in functional activities >10 min intervals during therapy sessions. PT Short Term Goal 3 (Week 2): Patient will perform transfers bed<>w/c with mod A consistently. PT Short Term Goal 4 (Week 2): Patient will ambulate >50 ft with max +2 assist. Week 3:     Skilled Therapeutic Interventions/Progress Updates:    PAIN  Pt seen this pm for treatment session w/focus on functional mobliity, ADLs, gait, dynamic and static motor control/trunk control/balance, simple cognitive tasks.  Pt initially      Therapy Documentation Precautions:  Precautions Precautions: Fall, Other (comment) Precaution Comments: Lt radial head fx; PIP fx Rt little finger, and MCP fx Rt ring finger; unrestricted ROM LUE per Dr Doreatha Martin note 10/29 Required Braces or Orthoses: Splint/Cast Splint/Cast: ulnar gutter splint (RUE); Lt elbow bean bag splint for elbow extension;  Lt UE NWB; peg Restrictions Weight Bearing Restrictions: Yes RUE Weight Bearing: Non weight bearing LUE Weight Bearing: Weight bearing as  tolerated    Therapy/Group: Individual Therapy  Callie Fielding, PT   Jerrilyn Cairo 06/29/2020, 9:55 AM

## 2020-06-30 ENCOUNTER — Inpatient Hospital Stay (HOSPITAL_COMMUNITY): Payer: Medicaid Other

## 2020-06-30 ENCOUNTER — Inpatient Hospital Stay (HOSPITAL_COMMUNITY): Payer: Medicaid Other | Admitting: Speech Pathology

## 2020-06-30 LAB — GLUCOSE, CAPILLARY
Glucose-Capillary: 73 mg/dL (ref 70–99)
Glucose-Capillary: 89 mg/dL (ref 70–99)
Glucose-Capillary: 78 mg/dL (ref 70–99)
Glucose-Capillary: 90 mg/dL (ref 70–99)
Glucose-Capillary: 82 mg/dL (ref 70–99)

## 2020-06-30 MED ORDER — SORBITOL 70 % SOLN
60.0000 mL | Status: AC
  Administered 2020-06-30: 60 mL via ORAL
  Filled 2020-06-30: qty 60

## 2020-06-30 NOTE — Progress Notes (Signed)
Occupational Therapy Session Note  Patient Details  Name: Beth [name] MRN: [id number] Date of Birth: 1986/05/20  Today's Date: 06/30/2020 OT Individual Time: 1345-1405 OT Individual Time Calculation (min): 20 min  and Today's Date: 06/30/2020 OT Missed Time: 50 Minutes Missed Time Reason: Patient fatigue   Short Term Goals: Week 3:  OT Short Term Goal 1 (Week 3): Patient will tolerate seated activity for 10 minutes OT Short Term Goal 2 (Week 3): Pt will pull up pants with mod A for balance OT Short Term Goal 3 (Week 3): Pt will complete self-ROM with max verbal and tactile cues  Skilled Therapeutic Interventions/Progress Updates:    Pt received sitting in TIS w/c with RN administering medication via peg. Pt restless and perseverative on police being outside of her door. Pt was able to be redirected, especially by RN who she is comfortable with, to attempt and take shower. TIS was wheeled into bathroom and pt impulsively stood, then reporting need to urinate. She required mod +2 assist to stand pivot to the toilet with cueing for weightbearing restrictions. Pt voided urine, with min A provided for sitting balance on the toilet. Pt completed peri hygiene seated with min A. New brief donned with max A. Pt then stood from toilet and was adamantly declining shower or ADLs, stating "I have got to get back to bed". Gentle redirection attempted. Functional mobility back to bed via 3 musketeers technique, mod +2 assist.  Pt was secured in her enclosure bed and given 15 min break before OT re-entered room to attempt engagement in session again. 3x more attempts, pt declining.   Therapy Documentation Precautions:  Precautions Precautions: Fall, Other (comment) Precaution Comments: Lt radial head fx; PIP fx Rt little finger, and MCP fx Rt ring finger; unrestricted ROM LUE per Dr Doreatha Martin note 10/29 Required Braces or Orthoses: Splint/Cast Splint/Cast: ulnar gutter splint (RUE); Lt elbow bean bag splint  for elbow extension;  Lt UE NWB; peg Restrictions Weight Bearing Restrictions: Yes RUE Weight Bearing: Non weight bearing LUE Weight Bearing: Weight bearing as tolerated  Therapy/Group: Individual Therapy  [name] 06/30/2020, 6:52 AM

## 2020-06-30 NOTE — Progress Notes (Signed)
Patient ID: Beth [name], female   DOB: 15-Apr-1986, 34 y.o.   MRN: [id number]  Patient/Family Conference  Patient/family in attendance: [name] (mother), Engineer, maintenance (IT) (fiance), Parks Neptune (step father), Kerry Dory (father), and Phineas Real (aunt)   Staff in attendance: Dr. Naaman Plummer (attending), Weston Anna (SLP), Laverle Hobby (OT), Dewaine Conger (PT), and Hortencia Pilar (PT), Loralee Pacas (SW)  Main focus: Discuss patient current level of functioning, and best plan of care for pt to transition home.  Synopsis of information shared: Family should intend to plan for 24/7 care. While pt is progressing in rehab specifically this week, and is now a Rancho 5, continued therapies will be required upon discharge.   Barriers/concerns expressed by patient and family: Pt family/ fiance Dalshawn expressed concerns related to if he is comfortable with providing the best level of care to patient based on her care needs. Family also expressed concerns related to if pt will be cleared to travel (fly) home,and if pt discharge date will remain 12/18.   Patient/family response: Family intends to explore alternatives options within their state such as additional acute rehab locations, nursing homes, and HHAs. Family to follow-up with pt local Medicaid worker to discuss resources, and if there are any community resources in area for transportation services.   Follow-up/action plans: SW to schedule new pt appointment with primary care doctor- [facility] [phone]), and explore alternative resources.   Loralee Pacas, MSW, [place] Office: [phone] Cell: [phone] Fax: [phone]

## 2020-06-30 NOTE — Patient Care Conference (Signed)
Inpatient RehabilitationTeam Conference and Plan of Care Update Date: 06/30/2020   Time: 10:31 AM    Patient Name: Beth [name]      Medical Record Number: [id number]  Date of Birth: 11-Oct-1985 Sex: Female         Room/Bed: 4W14C/4W14C-01 Payor Info: Payor: [place] / Plan: BCBS MEDICAID ADVANTAGE / Product Type: *No Product type* /    Admit Date/Time:  06/11/2020  8:36 PM  Primary Diagnosis:  TBI (traumatic brain injury) [facility])  Hospital Problems: Principal Problem:   TBI (traumatic brain injury) ([place]) Active Problems:   Sinus tachycardia   Psychogenic fecal incontinence   S/P percutaneous endoscopic gastrostomy (PEG) tube placement ([place])   Sleep disturbance   Acute blood loss anemia   Hyperglycemia   Oral ulcer   PEG tube malfunction ([place])   Oral herpes   Labile blood glucose    Expected Discharge Date: Expected Discharge Date: 07/11/20  Team Members Present: Physician leading conference: Dr. Alger Simons Care Coodinator Present: Loralee Pacas, LCSWA;Terral Cooks Creig Hines, RN, BSN, CRRN Nurse Present: Judee Clara, LPN PT Present: Tereasa Coop, PT OT Present: Cherylynn Ridges, OT SLP Present: Weston Anna, SLP PPS Coordinator present : Gunnar Fusi, SLP     Current Status/Progress Goal Weekly Team Focus  Bowel/Bladder   continent/incontinent of B/B. LBM 06/26/2020  Pt to continue to being continent  Assess B/B every shift and PRN   Swallow/Nutrition/ Hydration   NPO with PEG  Min A  trials with MBS planned for Wednesday   ADL's   Mod A of 1 for transfers, +2 for safety, able to grip foam with L hand intentionally, showered tihs week, improved sitting balance and overall attention within BADL tasks  min/CGA overall  cognitive retraining, self-care retraining, transfers, balance, L NMR   Mobility   modA bed mobility, modA +2 transfers, ambulation up to 200' with mod/maxA +2, increasing assist required with fatigue. appears more alert.  MinA   balance, ambulation, endurance, LLE NMR, cognition   Communication   Mod A for expression of wants/needs  Min A  increased engagement/verbalization of wants/needs   Safety/Cognition/ Behavioral Observations  Rancho Level IV-V, MaxA  Min-Mod A  attention, initiation, basic safety, family education   Pain   Pt with intermittent S/Sx of pain  with a FACES scale <4  Assess pain every shift and PRN   Skin   healing abrasions to right hand  no new skin breakdown  Assess skin every shift and PRN     Discharge Planning:  Pt is uninsured. Family is possibly considering SNF placement here in [place]. TBD if pt will return to [facility] [place] to home with her fiance and three children. Support from mother and stepfather. Family is currently here in [place]. Family meeting tomorrow 12/7 at 11:30am.   Team Discussion: Slept well last night. Still incontinent B/B. OT reports patient is restless, some return in left hand, patient showered on Monday. PT reports patient is a min-mod assist for bed mobility, light min-mod assist for transfers, can ambulate up to 200'. SLP reports patient will have a MBS tomorrow, and today patient is requesting to smoke. MD reports patient is a Rancho V and appears to be more aware. Therapy reports this fluctuates throughout the day.  Patient on target to meet rehab goals: yes  *See Care Plan and progress notes for long and short-term goals.   Revisions to Treatment Plan:  Not at this time.  Teaching Needs: Continue with family education.  Current Barriers to Discharge: Decreased caregiver support, Medical stability, Home enviroment access/layout, Incontinence, Wound care, Weight bearing restrictions, Behavior and Nutritional means  Possible Resolutions to Barriers: Continue current medications, offer nutritional supplementation, timed toileting when appropriate, educate wound care, educate weight bearing precautions, provide emotional support to family and patient.     Medical  Summary Current Status: making cognitive and behavioral gains. still in enclosure bed. agitation decreasing. RLAS V/VI today. still NPO  Barriers to Discharge: Medical stability;Behavior   Possible Resolutions to [employer]: ongoing sleep-wake restoration, daily med mgt, MBS--->begin on PO diet. behavioral mgt   Continued Need for Acute Rehabilitation Level of Care: The patient requires daily medical management by a physician with specialized training in physical medicine and rehabilitation for the following reasons: Direction of a multidisciplinary physical rehabilitation program to maximize functional independence : Yes Medical management of patient stability for increased activity during participation in an intensive rehabilitation regime.: Yes Analysis of laboratory values and/or radiology reports with any subsequent need for medication adjustment and/or medical intervention. : Yes   I attest that I was present, lead the team conference, and concur with the assessment and plan of the team.   Cristi Loron 06/30/2020, 1:43 PM

## 2020-06-30 NOTE — Progress Notes (Signed)
Physical Therapy Weekly Progress Note  Patient Details  Name: Beth [name] MRN: [id number] Date of Birth: 17-Jul-1986  Beginning of progress report period: June 19, 2020 End of progress report period: June 30, 2020  Today's Date: 06/30/2020 PT Individual Time: 0801-0859 PT Individual Time Calculation (min): 58 min   Patient has met 2 of 4 short term goals.  Pt is progressing very well toward mobility goals, demonstrating significant improvement in alertness and participation over past several sessions. Pt has been consistently less agitated and irritable and demonstrating improvements in Rancho level, per behavior. Pt performing bed mobility consistently at [place] level and occasionally light minA. Pt transfers at [place] +1 and ambulating up to ~200' with modA +2, with level of assistance increasing as pt fatigues. Pt still fatigues very quickly and requires frequent rest breaks, especially with tasks that involve both cognitive and physical component.   Patient continues to demonstrate the following deficits muscle weakness, decreased cardiorespiratoy endurance, decreased coordination and decreased motor planning, decreased attention to left, decreased attention, decreased awareness, decreased problem solving, decreased safety awareness and decreased memory and decreased sitting balance, decreased standing balance, decreased postural control, hemiplegia and decreased balance strategies and therefore will continue to benefit from skilled PT intervention to increase functional independence with mobility.  Patient progressing toward long term goals..  Continue plan of care.  PT Short Term Goals Week 2:  PT Short Term Goal 1 (Week 2): Patient will perform bed mobility with mod A consistently. PT Short Term Goal 1 - Progress (Week 2): Met PT Short Term Goal 2 (Week 2): Patient will participate in functional activities >10 min intervals during therapy sessions. PT Short Term Goal 2 - Progress  (Week 2): Progressing toward goal PT Short Term Goal 3 (Week 2): Patient will perform transfers bed<>w/c with mod A consistently. PT Short Term Goal 3 - Progress (Week 2): Progressing toward goal PT Short Term Goal 4 (Week 2): Patient will ambulate >50 ft with max +2 assist. PT Short Term Goal 4 - Progress (Week 2): Met Week 3:  PT Short Term Goal 1 (Week 3): Patient will participate in functional activities >10 min intervals during therapy sessions. PT Short Term Goal 2 (Week 3): Patient will perform transfers bed<>w/c with mod A consistently. PT Short Term Goal 3 (Week 3): Pt will ambulate 100' with modA +1. PT Short Term Goal 4 (Week 3): Pt will complete x4 steps with modA +1.  Skilled Therapeutic Interventions/Progress Updates:  Ambulation/gait training;Discharge planning;Functional mobility training;Psychosocial support;Therapeutic Activities;Visual/perceptual remediation/compensation;Balance/vestibular training;Disease management/prevention;Neuromuscular re-education;Skin care/wound management;Therapeutic Exercise;Wheelchair propulsion/positioning;Cognitive remediation/compensation;DME/adaptive equipment instruction;Pain management;Splinting/orthotics;UE/LE Strength taining/ROM;Community reintegration;Functional electrical stimulation;Patient/family education;Stair training;UE/LE Coordination activities   Pt received supine in bed and agrees to therapy. Reports some pain in R hand. Number not provided. PT provides repositioning and rest breaks to manage pain symptoms. Supine to sit with minA. Pt performs stand pivot transfer to [place] with modA +1. Stand pivot transfer to toilet with modA. Pt is able to doff brief with verbal cues. Pt performs anterior pericare with set-up assist. Pt washes hands and requires assistance to attend to L hand but is able to provided hand over hand washing technique with PT supporting L elbow.   Stand pivot transfer to mat table with modA. Pt participate sin NMR for  sitting and standing balance with cognitive overlay. Pt stands from mat with min/modA +2 HHA. Mirror positioned for visual feedback. Pt performs cognitive tasks in standing, including separating "odds and evens" with playing cards, and arranging cards in numerical  order. Pt requires mod verbal cuing to remain on task and to perform correctly, and requires frequent rest breaks. PT provides manual facilitation of L weight shift and L knee extension. Pt is able to shift weight to left with verbal cuing but quickly shifts weight back onto R leg.   Pt ambulates x100' with modA +2 HHA. PT provides primarily verbal cuing for pt to advance L leg during swing phase, but pt occasoinally requires manual facilitation for L stride and maintaining wide enough BOS. Pt requests to return to bed and performs sit to supine with minA. Pt left with all needs within reach.  Therapy Documentation Precautions:  Precautions Precautions: Fall, Other (comment) Precaution Comments: Lt radial head fx; PIP fx Rt little finger, and MCP fx Rt ring finger; unrestricted ROM LUE per Dr Doreatha Martin note 10/29 Required Braces or Orthoses: Splint/Cast Splint/Cast: ulnar gutter splint (RUE); Lt elbow bean bag splint for elbow extension;  Lt UE NWB; peg Restrictions Weight Bearing Restrictions: Yes RUE Weight Bearing: Non weight bearing LUE Weight Bearing: Weight bearing as tolerated   Therapy/Group: Individual Therapy  Breck Coons, PT, DPT 06/30/2020, 12:25 PM

## 2020-06-30 NOTE — Progress Notes (Signed)
Patient ID: Beth [name], female   DOB: [birth year], 34 y.o.   MRN: [id number]  Following family conference, SW discussed with pt family- [name] (mother), Mortimer Fries (fiance), Parks Neptune (step father), Kerry Dory (father), and Phineas Real (aunt) best plan of care for patient. Family intends to explore acute rehab programs and skilled nursing homes in [place]. Family provided HHA list in the event this service is recommended. If outpatient, family prefers [facility] (U:[phone]/E:[phone]). SW discussed scheduling PCP- [facility] is the provider [phone]). Family does understand that attending will provide clearance on if pt is cleared to travel (fly). SW to follow-up with family to discuss next steps.   SW spoke with Sue/RN with [facility] [place] [phone]) to schedule hospital follow-up on December 30 at 11:30am with Dr. Delorise Jackson. Must arrive by 11:15am. SW confirmed with [facility] outpatient [facility] that this site accepts pt insurance.   SW called [name] [phone]) to inform on above about PCP and outpatient clinical. Family education scheduled for Friday 12/10 Monday 12/13, Wednesday 12/15 and Thursday 12/16 10am-12pm and 1pm-3pm; two visitors per session.   Loralee Pacas, MSW, [place] Office: [phone] Cell: [phone] Fax: [phone]
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
## 2020-06-30 NOTE — Progress Notes (Signed)
Speech Language Pathology Daily Session Note  Patient Details  Name: Zion Lint MRN: [id number] Date of Birth: Apr 17, 1986  Today's Date: 06/30/2020 SLP Individual Time: 8185-6314 SLP Individual Time Calculation (min): 53 min  Short Term Goals: Week 3: SLP Short Term Goal 1 (Week 3): Patient will answer basic yes/no questions in regards to wants/needs in 75% of opportunities. SLP Short Term Goal 2 (Week 3): Patient will consume trials of Dys. 1 textures and thin liquids with minimal overt s/s of aspiration over 3 sessions to asses readiness for MBS. SLP Short Term Goal 3 (Week 3): Patient will demonstrate focused attention to a functional task for ~60 seconds with Max verbal and tactile cues for redirection. SLP Short Term Goal 4 (Week 3): Patient will participate in 3 functional tasks within a 60 minute session in 75% of opportunities with Max verbal cues. SLP Short Term Goal 5 (Week 3): Patient will follow basic commands in 50% of opportunities with Max verbal cues.  Skilled Therapeutic Interventions: Skilled treatment session focused on dysphagia and cognitive goals. SLP facilitated session by providing trials of Dys. 1 textures with thin liquids. Patient consumed trials without overt s/s of aspiration and was able to maintain attention to self-feed ~4 oz of Dys. 1 textures, therefore, recommend MBS tomorrow to assess swallow function. SLP also facilitated session by providing Min A verbal cues for sorting money from a field of 4 but required Mod-Max A verbal and visual cues for problem solving while counting change.  Patient also performed oral care with Mod verbal cues for problem solving. Patient was continent of urine and left supine in enclosure bed. Patient with intermittent perseveration that was redirected. Continue with current plan of care.      Pain No/Denies Pain   Therapy/Group: Individual Therapy  Wakisha Alberts 06/30/2020, 10:10 AM

## 2020-07-01 ENCOUNTER — Inpatient Hospital Stay (HOSPITAL_COMMUNITY): Payer: Medicaid Other

## 2020-07-01 ENCOUNTER — Encounter (HOSPITAL_COMMUNITY): Payer: Medicaid Other | Admitting: Occupational Therapy

## 2020-07-01 LAB — GLUCOSE, CAPILLARY
Glucose-Capillary: 121 mg/dL — ABNORMAL HIGH (ref 70–99)
Glucose-Capillary: 90 mg/dL (ref 70–99)
Glucose-Capillary: 89 mg/dL (ref 70–99)
Glucose-Capillary: 73 mg/dL (ref 70–99)
Glucose-Capillary: 165 mg/dL — ABNORMAL HIGH (ref 70–99)

## 2020-07-01 MED ORDER — ENSURE ENLIVE PO LIQD
237.0000 mL | Freq: Two times a day (BID) | ORAL | Status: DC
  Administered 2020-07-02 – 2020-07-21 (×22): 237 mL via ORAL

## 2020-07-01 MED ORDER — OSMOLITE 1.5 CAL PO LIQD
355.0000 mL | Freq: Every day | ORAL | Status: AC
  Administered 2020-07-01: 355 mL

## 2020-07-01 MED ORDER — FREE WATER
100.0000 mL | Status: DC
  Administered 2020-07-01 – 2020-07-08 (×38): 100 mL

## 2020-07-01 MED ORDER — OSMOLITE 1.5 CAL PO LIQD
1000.0000 mL | ORAL | Status: DC
  Filled 2020-07-01: qty 1000
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
## 2020-07-01 NOTE — Progress Notes (Signed)
Modified Barium Swallow Progress Note  Patient Details  Name: Beth [name] MRN: [id number] Date of Birth: 12-14-1985  Today's Date: 07/01/2020  Modified Barium Swallow completed.  Full report located under Chart Review in the Imaging Section.  Brief recommendations include the following:  Clinical Impression  Patient demonstrates a mild oral dysphagia which is cognitive in nature. Patient demonstrates mildly prolonged AP transit and mastication with decreased bolus cohesion. This is due to decreased attention/awareness which leads to consistent swallow initiation at the velleculae with solid textures.  There was no pharyngeal residue noted and patient was able to fully protect her airway with both solids and liquids despite being challenged with large sips. Recommend patient initiate a diet of Dys. 2 textures with thin liquids with full supervision to maximize safety with PO intake.   Swallow Evaluation Recommendations       SLP Diet Recommendations: Dysphagia 2 (Fine chop) solids;Thin liquid   Liquid Administration via: Straw   Medication Administration: Crushed with puree   Supervision: Patient able to self feed;Full supervision/cueing for compensatory strategies;Staff to assist with self feeding   Compensations: Minimize environmental distractions;Slow rate;Small sips/bites   Postural Changes: Seated upright at 90 degrees (OOB for meals)   Oral Care Recommendations: Oral care BID        [name] 07/01/2020,9:30 AM

## 2020-07-01 NOTE — Progress Notes (Signed)
Occupational Therapy Session Note  Patient Details  Name: Beth [name] MRN: [id number] Date of Birth: 1986/01/19  Today's Date: 07/01/2020 OT Individual Time: [id number] OT Individual Time Calculation (min): 30 min  and Today's Date: 07/01/2020 OT Missed Time: 30 Minutes Missed Time Reason: Patient fatigue   Short Term Goals: Week 3:  OT Short Term Goal 1 (Week 3): Patient will tolerate seated activity for 10 minutes OT Short Term Goal 2 (Week 3): Pt will pull up pants with mod A for balance OT Short Term Goal 3 (Week 3): Pt will complete self-ROM with max verbal and tactile cues  Skilled Therapeutic Interventions/Progress Updates:    Pt received supine in enclosure bed, resting quietly. Upon initial entry to room pt politely declined any OOB mobility. She engaged in appropriate conversation with OT with gentle reorientation provided as she was oriented to location only. She tolerated gentle PROM to her LUE, as well as AROM gross grasp and release. Pt was given a rest break and then OT re-entered room and she reported need to use the bathroom. Bed mobility completed with CGA. Cueing provided throughout session to reduce impulsivity and for sequencing. 3 musketeers ambulatory transfer from bed to bathroom with mod +2 assist, occasional manual facilitation for LLE advancement. Pt required mod A for toileting tasks, heavily incontinent of urine. Pt declined donning new pants. She returned to bed and transferred back to supine. She politely declined any further participation stating she wanted to speak with her husband, who was now in the room. Pt left secured in closed enclosure bed.   Therapy Documentation Precautions:  Precautions Precautions: Fall, Other (comment) Precaution Comments: Lt radial head fx; PIP fx Rt little finger, and MCP fx Rt ring finger; unrestricted ROM LUE per Dr Doreatha Martin note 10/29 Required Braces or Orthoses: Splint/Cast Splint/Cast: ulnar gutter splint (RUE); Lt elbow  bean bag splint for elbow extension;  Lt UE NWB; peg Restrictions Weight Bearing Restrictions: Yes RUE Weight Bearing: Non weight bearing LUE Weight Bearing: Non weight bearing   Therapy/Group: Individual Therapy  [name] 07/01/2020, 6:28 AM

## 2020-07-01 NOTE — Progress Notes (Signed)
Reported that pt had ordered continuous TFing from1800-0600, but feeding had not been started. Questioned order as pt is still in enclosure bed. Called Reesa Chew PA, provider on call. Orders rec'd.

## 2020-07-01 NOTE — Progress Notes (Signed)
   07/01/20 1647  What Happened  Was fall witnessed? Yes  Who witnessed fall? Nurse tech  Patients activity before fall bathroom-assisted  Point of contact buttocks  Was patient injured? No  Follow Up  MD notified Pam PA  Time MD notified [phone]  Family notified Yes - comment Kerry Dory Mccarrick)  Time family notified [id number]  Additional tests No  Adult Fall Risk Assessment  Risk Factor Category (scoring not indicated) High fall risk per protocol (document High fall risk)  Patient Fall Risk Level High fall risk  Adult Fall Risk Interventions  Required Bundle Interventions *See Row Information* High fall risk - low, moderate, and high requirements implemented  Additional Interventions Use of appropriate toileting equipment (bedpan, BSC, etc.)  Screening for Fall Injury Risk (To be completed on HIGH fall risk patients) - Assessing Need for Low Bed  Risk For Fall Injury- Low Bed Criteria TeleSitter Camera in use  Will Implement Low Bed and Floor Mats No - Criteria no longer met for low bed  Screening for Fall Injury Risk (To be completed on HIGH fall risk patients who do not meet crieteria for Low Bed) - Assessing Need for Floor Mats Only  Risk For Fall Injury- Criteria for Floor Mats Confusion/dementia (+NuDESC, CIWA, TBI, etc.)  Will Implement Floor Mats Yes  Vitals  Temp 98.2 F (36.8 C)  Temp Source Oral  BP 123/76  BP Location Right Arm  BP Method Automatic  Pulse Rate 85  Pulse Rate Source Dinamap  Resp 16  Oxygen Therapy  SpO2 100 %  O2 Device Room Air  Pain Assessment  Pain Scale 0-10  Pain Score 0  PCA/Epidural/Spinal Assessment  Respiratory Pattern Regular  Neurological  Neuro (WDL) X  Orientation Level Oriented to person  Cognition Memory impairment  Speech Clear  Pupil Assessment  No  Motor Function/Sensation Assessment Grip  Facial Symmetry Symmetrical  R Hand Grip Moderate  L Hand Grip Weak   RUE Motor Response Purposeful movement  RUE Motor Strength 4  Neuro  Symptoms Anxiety;Forgetful  Musculoskeletal  Musculoskeletal (WDL) X  Assistive Device Wheelchair  Generalized Weakness Yes  LUE Weight Bearing NWB  Musculoskeletal Details  RUE Full movement  LUE Limited movement  RLE Full movement  LLE Full movement;Weakness  Integumentary  Integumentary (WDL) X  Skin Color Appropriate for ethnicity  Skin Condition Dry  Skin Integrity Intact;Ecchymosis  Abrasion Location Head;Arm  Catheter Entry/Exit Location Abdomen  Catheter Entry/Exit Location Orientation Medial  Ecchymosis Location Eye  Ecchymosis Location Orientation Right  Skin Turgor Non-tenting
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
## 2020-07-01 NOTE — Progress Notes (Signed)
Patient ID: Beth [name], female   DOB: [birth year], 34 y.o.   MRN: [id number]  SW left message for [facility] Worker [phone]) requesting return phone call. SW waiting on follow-up.    Loralee Pacas, MSW, [place] Office: [phone] Cell: [phone] Fax: [phone]

## 2020-07-01 NOTE — Progress Notes (Signed)
Speech Language Pathology Weekly Progress Note  Patient Details  Name: Beth [name] MRN: [id number] Date of Birth: 15-Jul-1986  Beginning of progress report period: June 25, 2020 End of progress report period: July 01, 2020  Short Term Goals: Week 3: SLP Short Term Goal 1 (Week 3): Patient will answer basic yes/no questions in regards to wants/needs in 75% of opportunities. SLP Short Term Goal 1 - Progress (Week 3): Met SLP Short Term Goal 2 (Week 3): Patient will consume trials of Dys. 1 textures and thin liquids with minimal overt s/s of aspiration over 3 sessions to asses readiness for MBS. SLP Short Term Goal 2 - Progress (Week 3): Met SLP Short Term Goal 3 (Week 3): Patient will demonstrate focused attention to a functional task for ~60 seconds with Max verbal and tactile cues for redirection. SLP Short Term Goal 3 - Progress (Week 3): Met SLP Short Term Goal 4 (Week 3): Patient will participate in 3 functional tasks within a 60 minute session in 75% of opportunities with Max verbal cues. SLP Short Term Goal 4 - Progress (Week 3): Met SLP Short Term Goal 5 (Week 3): Patient will follow basic commands in 50% of opportunities with Max verbal cues. SLP Short Term Goal 5 - Progress (Week 3): Met    New Short Term Goals: Week 4: SLP Short Term Goal 1 (Week 4): Patient will verbalize orientation to place, time (month/year) and situation with Mod verbal cues. SLP Short Term Goal 2 (Week 4): Patient will demonstrate sustained attention to a functional task for 10 minutes with Mod verbal cues for redirection. SLP Short Term Goal 3 (Week 4): Patient will demonstrate functional problem solving for basic and familiar tasks with Mod verbal cues. SLP Short Term Goal 4 (Week 4): Patient will consume current diet with minimal overt s/s of aspiration and Min verbal cues for use of swallowing compensatory strategies.  Weekly Progress Updates: Patient has made excellent gains and has met 5 of 5  STGs this reporting period. Currently, patient demonstrates behaviors consistent with a [place] Level V and demonstrates improved participation, improved ability to verbally express wants/needs, improved sustained attention, improved ability to follow commands, improved orientation with ability to use external aids, and improved activity tolerance. Patient also demonstrates decreased agitation and restlessness. Patient had an MBS today and was placed on Dys. 2 textures with thin liquids with full supervision. Patient and family education ongoing. Patient would benefit from continued skilled SLP intervention to maximize her swallowoing and  cognitive functioning and overall functional independence prior to discharge.     Intensity: Minumum of 1-2 x/day, 30 to 90 minutes Frequency: 3 to 5 out of 7 days Duration/Length of Stay: 07/11/20 Treatment/Interventions: Cognitive remediation/compensation;Cueing hierarchy;Environmental controls;Functional tasks;Internal/external aids;Dysphagia/aspiration precaution training;Patient/family education;Therapeutic Activities     [place], [place] 07/01/2020, 6:34 AM

## 2020-07-01 NOTE — Progress Notes (Signed)
Speech Language Pathology Daily Session Note  Patient Details  Name: Beth [name] MRN: [id number] Date of Birth: January 21, 1986  Today's Date: 07/01/2020 SLP Individual Time: 0630-1601 SLP Individual Time Calculation (min): 20 min  Short Term Goals: Week 4: SLP Short Term Goal 1 (Week 4): Patient will verbalize orientation to place, time (month/year) and situation with Mod verbal cues. SLP Short Term Goal 2 (Week 4): Patient will demonstrate sustained attention to a functional task for 10 minutes with Mod verbal cues for redirection. SLP Short Term Goal 3 (Week 4): Patient will demonstrate functional problem solving for basic and familiar tasks with Mod verbal cues. SLP Short Term Goal 4 (Week 4): Patient will consume current diet with minimal overt s/s of aspiration and Min verbal cues for use of swallowing compensatory strategies. SLP Short Term Goal 5 (Week 4): Patient will demonstrate efficient mastication and complete oral clearance with trials of Dys. 3 textures over 2 sessions with Min verbal cues prior to upgrade.  Skilled Therapeutic Interventions: Skilled treatment session focused on cognitive goals. SLP facilitated session by providing encouragement for patient to use the commode. Patient transferred to the commode with Mod verbal cues for safety and was able to void. Min verbal cues were also needed for problem solving with self care-tasks like donning pants and washing hands. Patient transferred to the wheelchair for transport to the MBS. Please see MBS report for details. Continue with current plan of care.      Pain Pain Assessment Pain Scale: 0-10 Pain Score: 0-No pain  Therapy/Group: Individual Therapy  Elton Catalano 07/01/2020, 9:17 AM

## 2020-07-02 ENCOUNTER — Inpatient Hospital Stay (HOSPITAL_COMMUNITY): Payer: Medicaid Other

## 2020-07-02 ENCOUNTER — Inpatient Hospital Stay (HOSPITAL_COMMUNITY): Payer: Medicaid Other | Admitting: Occupational Therapy

## 2020-07-02 LAB — GLUCOSE, CAPILLARY
Glucose-Capillary: 100 mg/dL — ABNORMAL HIGH (ref 70–99)
Glucose-Capillary: 86 mg/dL (ref 70–99)
Glucose-Capillary: 70 mg/dL (ref 70–99)

## 2020-07-02 MED ORDER — OSMOLITE 1.5 CAL PO LIQD
355.0000 mL | Freq: Once | ORAL | Status: AC
  Administered 2020-07-02: 355 mL

## 2020-07-02 NOTE — Progress Notes (Signed)
Physical Therapy Session Note  Patient Details  Name: Beth [name] MRN: [id number] Date of Birth: March 05, 1986  Today's Date: 07/02/2020 PT Individual Time: 7793-9030 and 0923-3007 PT Individual Time Calculation (min): 42 min and 53 min  Short Term Goals: Week 3:  PT Short Term Goal 1 (Week 3): Patient will participate in functional activities >10 min intervals during therapy sessions. PT Short Term Goal 2 (Week 3): Patient will perform transfers bed<>w/c with mod A consistently. PT Short Term Goal 3 (Week 3): Pt will ambulate 100' with modA +1. PT Short Term Goal 4 (Week 3): Pt will complete x4 steps with modA +1.  Skilled Therapeutic Interventions/Progress Updates:     1st Session: Pt received supine in bed and agrees to therapy. Pt requires reorientation to location and situation, and perseverates throughout session on need to "go home" and "get a ride to [facility]". PT provides gentle redirection and education on current setting. Supine to sit with minA. Pt performs sit to stand from EOB with minA +2 for safety, then performs ambulatory transfer to toilet with modA +2 and modA to doff pants. Pt performs urinary pericare with set-up assist. Sit to stand form toilet with modA +2 and ambulatory transfer to [facility]. Pt washes hands at sink with verbal cues for initiation, and PT providing support at L elbow with pt performing hand over hand washing using R hand. WC transport to gym for time management. Pt performs stand pivot transfer to mat table with modA +1. Pt continues to perseverate on needing to go home and needing to go back to bed. Pt ambulates 180' with modA +2, with PT providing verbal cues for progression of L leg, and manual facilitation of L hip abductor activation. Pt has trendelenburg gait pattern on L that worsens with fatigue. Pt does not require manual assist to progress L leg. Following seated rest break, pt ambulates additional 150' with similar assist but increasing difficulty with  progress L leg, occasionally requiring manual assist. Pt takes supine rest break prior to ambulating 150' back to room. Sit to supine with supervision for safety. Left supine in Posey bed with call bell within reach.  2nd Session: Pt received supine in bed and agrees to therapy. No complaint of pain. Pt thinks it's too early in the morning to participate (session is a 2:30pm) but is able to be redirected easily. Supine to sit with minA. Stand pivot transfer to tilt in space WC with HHA modA +1. WC transport to therapy gym for time management and energy conservation. Pt performs NMR for standing balance and activity tolerance with cognitive overlay, participating in [place] system. Pt initially performs memory exercise in standing, then performing maze tracing. Pt requires min verbal cues for each activity and modA +2 for standing balance, with PT facilitating increased weight shift to the L. Pt able to remain standing up to ~5 minutes at a time prior to needing seated rest break. Pt then performs "scavenger hunt" to search for occupation therapist she had worked with earlier in the day. Pt ambulates 200' with modA +2 with verbal cues for progression of L leg and constant manual facilitation of L hip abductor activation and pelvic rotation. Pt returns to room and performs ADLs at sink with mirror for visual feedback, including teeth brushing and hand washing. ModA +2 for standing balance with emphasis on symmetrical WB. Ambulatory transfer to toilet where pt is continent of urine and performs pericare with verbal cues on proper timing. Stand step transfer back to bed  and return to supine with supervision. Left in posey bed with call bell in reach.  Therapy Documentation Precautions:  Precautions Precautions: Fall,Other (comment) Precaution Comments: Lt radial head fx; PIP fx Rt little finger, and MCP fx Rt ring finger; unrestricted ROM LUE per Dr Doreatha Martin note 10/29 Required Braces or Orthoses:  Splint/Cast Splint/Cast: ulnar gutter splint (RUE); Lt elbow bean bag splint for elbow extension;  Lt UE NWB; peg Restrictions Weight Bearing Restrictions: Yes RUE Weight Bearing: Non weight bearing LUE Weight Bearing: Non weight bearing   Therapy/Group: Individual Therapy  Breck Coons, PT, DPT 07/02/2020, 12:16 PM

## 2020-07-02 NOTE — Progress Notes (Signed)
Patient ID: Beth [name], female   DOB: 1986-03-19, 34 y.o.   MRN: [id number]   SW left message for [name] [phone]) and sent email to:[email or web address]; requesting return phone call. SW waiting on follow-up.  [id number]$, MSW, [place] Office: [phone] Cell: [phone] Fax: [phone]
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
## 2020-07-02 NOTE — Progress Notes (Signed)
Patient refused bottle of magnesium citrate at this time, will pass on to next shift nurse to re-attempt. No complications noted at this time.  Audie Clear, LPN

## 2020-07-02 NOTE — Progress Notes (Signed)
Occupational Therapy Weekly Progress Note  Patient Details  Name: Beth [name] MRN: [id number] Date of Birth: 1985-08-21  Beginning of progress report period: June 12, 2020 End of progress report period: July 02, 2020  Today's Date: 07/02/2020  Session 1 OT Individual Time: 1100-1200 OT Individual Time Calculation (min): 60 min   Session 2 OT Individual Time: 4166-0630 OT Individual Time Calculation (min): 45 min    Patient has met 3 of 3 short term goals.  Patient is making steady progress towards OT goals. She has been less restless and is participating well in therapies. Pt can transfer with min/mod A, with +2 present for safety given patients impulsiveness. She can also assist with BADL tasks with mod A and improved attention to functional tasks. L hand has developed some grip this week, but it is still not functional. Continue current POC.  Patient continues to demonstrate the following deficits: muscle weakness, impaired timing and sequencing, abnormal tone, unbalanced muscle activation, motor apraxia, ataxia, decreased coordination and decreased motor planning, decreased midline orientation, decreased attention to left and decreased motor planning, decreased initiation, decreased attention, decreased awareness, decreased problem solving, decreased safety awareness, decreased memory and delayed processing and decreased sitting balance, decreased standing balance, decreased postural control, hemiplegia and decreased balance strategies and therefore will continue to benefit from skilled OT intervention to enhance overall performance with BADL and Reduce care partner burden.  Patient progressing toward long term goals..  Continue plan of care.  OT Short Term Goals Week 3:  OT Short Term Goal 1 (Week 3): Patient will tolerate seated activity for 10 minutes OT Short Term Goal 1 - Progress (Week 3): Met OT Short Term Goal 2 (Week 3): Pt will pull up pants with mod A for balance OT  Short Term Goal 2 - Progress (Week 3): Met OT Short Term Goal 3 (Week 3): Pt will complete self-ROM with max verbal and tactile cues OT Short Term Goal 3 - Progress (Week 3): Met Week 4:  OT Short Term Goal 1 (Week 4): LTG=STG 2/2ELOS  Skilled Therapeutic Interventions/Progress Updates:    Session 1 Pt greeted semi-reclined in enclosure bed and agreeable to OT treatment session. Pt perseverating on wanting to get out of here and needing to borrow a phone to call family. Pt even reaching and pulling OT's phone out of her pocket at times. Pt agreeable to go to the bathroom and completed stand-pivots with min A and +2 for safety and assistance with clothing management. Pt with continent void of bladder, then pt ambulated short distance to tub bench in shower with mod A. Bathing completed with mod A and verbal cues for thoroughness and to move onto next body part. Pt needed OT right there with her during shower bc she would impuslively scoot forward at times. Dressing completed from wc with mod A for UB and mod A for LB dressing. Sit<>stands with min A, then +2 to help pull up pants. Pt's mother entered room for session. Pt brought to dayroom and ambulated 30 feet 2x with mod HHA. Pt continued to perseverate on feeling alone and no one visiting her even though her mom was there and family is present daily. Pt brought to high-low table to worked on [place]. Used towel pushes across table in sitting with pt able to activate shoulder and flexor synergy. Continued working on functional use of L UE with grasp of cups- then sliding cups across table to mother. Pt was able to grasp cups and push  then 50% of the way across body. Pt returned to room and pivoted back to bed with min A. 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 Pt left with mother present and needs met.   Session 2 Pt greeted semi-reclined in bed with family about to leave. Per family, patient ate a good  lunch-collard greens. Pt agreeable to participate in OT. Pt less perseverative on wanting to leave and was easier to redirect than in earlier session. Stand-pivot to commode with min A, then assist to pulll down pants. Pt with continent void of bladder and able to complete peri-care with set-up A. Pt brought to the sink to wash hands with min A to integrate L UE. Pt brought to the dayroom and worked on standing balance/endurance with bean bag toss into [employer]. Worked on sequencing and memory with giving patient a pattern of 2 colored bean bags, progressing to three, then four... Pt was able to recall up to 4 color pattern without cues. L UE NMR with B UE task of pushing and pulling large bosu ball. Functional use of L hand with grasp and release of foam cube. Pt returned to bed at end of session with min A. Pt left semi-reclined in bed with needs met.   Therapy Documentation Precautions:  Precautions Precautions: Fall,Other (comment) Precaution Comments: Lt radial head fx; PIP fx Rt little finger, and MCP fx Rt ring finger; unrestricted ROM LUE per Dr Doreatha Martin note 10/29 Required Braces or Orthoses: Splint/Cast Splint/Cast: ulnar gutter splint (RUE); Lt elbow bean bag splint for elbow extension;  Lt UE NWB; peg Restrictions Weight Bearing Restrictions: Yes RUE Weight Bearing: Non weight bearing LUE Weight Bearing: Non weight bearing Pain: Pain Assessment Pain Scale: 0-10 Pain Score: 0-No pain   Therapy/Group: Individual Therapy  Valma Cava 07/02/2020, 1:48 PM
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
## 2020-07-03 ENCOUNTER — Encounter (HOSPITAL_COMMUNITY): Payer: Medicaid Other | Admitting: Occupational Therapy

## 2020-07-03 ENCOUNTER — Ambulatory Visit (HOSPITAL_COMMUNITY): Payer: Medicaid Other

## 2020-07-03 ENCOUNTER — Encounter (HOSPITAL_COMMUNITY): Payer: Medicaid Other | Admitting: Speech Pathology

## 2020-07-03 LAB — CBC
HCT: 38.8 % (ref 36.0–46.0)
Hemoglobin: 13 g/dL (ref 12.0–15.0)
MCH: 30.3 pg (ref 26.0–34.0)
MCHC: 33.5 g/dL (ref 30.0–36.0)
MCV: 90.4 fL (ref 80.0–100.0)
Platelets: 340 10*3/uL (ref 150–400)
RBC: 4.29 MIL/uL (ref 3.87–5.11)
RDW: 13.4 % (ref 11.5–15.5)
WBC: 6.6 10*3/uL (ref 4.0–10.5)
nRBC: 0 % (ref 0.0–0.2)

## 2020-07-03 LAB — GLUCOSE, CAPILLARY
Glucose-Capillary: 103 mg/dL — ABNORMAL HIGH (ref 70–99)
Glucose-Capillary: 75 mg/dL (ref 70–99)
Glucose-Capillary: 105 mg/dL — ABNORMAL HIGH (ref 70–99)

## 2020-07-03 LAB — COMPREHENSIVE METABOLIC PANEL
ALT: 27 U/L (ref 0–44)
AST: 28 U/L (ref 15–41)
Albumin: 3.5 g/dL (ref 3.5–5.0)
Alkaline Phosphatase: 125 U/L (ref 38–126)
Anion gap: 13 (ref 5–15)
BUN: 14 mg/dL (ref 6–20)
CO2: 24 mmol/L (ref 22–32)
Calcium: 9.9 mg/dL (ref 8.9–10.3)
Chloride: 103 mmol/L (ref 98–111)
Creatinine, Ser: 0.57 mg/dL (ref 0.44–1.00)
GFR, Estimated: >60 mL/min (ref >60)
Glucose, Bld: 96 mg/dL (ref 70–99)
Potassium: 4 mmol/L (ref 3.5–5.1)
Sodium: 140 mmol/L (ref 135–145)
Total Bilirubin: 0.4 mg/dL (ref 0.3–1.2)
Total Protein: 7.1 g/dL (ref 6.5–8.1)

## 2020-07-03 MED ORDER — MEGESTROL ACETATE 400 MG/10ML PO SUSP
400.0000 mg | Freq: Two times a day (BID) | ORAL | Status: DC
  Administered 2020-07-03 – 2020-07-09 (×13): 400 mg
  Filled 2020-07-03 (×13): qty 10

## 2020-07-03 MED ORDER — OSMOLITE 1.5 CAL PO LIQD
355.0000 mL | Freq: Three times a day (TID) | ORAL | Status: DC
  Administered 2020-07-03 – 2020-07-05 (×3): 355 mL
  Filled 2020-07-03: qty 474

## 2020-07-03 MED ORDER — METHYLPHENIDATE HCL 5 MG PO TABS
15.0000 mg | ORAL_TABLET | Freq: Two times a day (BID) | ORAL | Status: DC
  Administered 2020-07-03 – 2020-07-05 (×4): 15 mg
  Filled 2020-07-03 (×4): qty 3

## 2020-07-03 NOTE — Progress Notes (Signed)
Speech Language Pathology Daily Session Note  Patient Details  Name: Mindee Robledo MRN: [id number] Date of Birth: 1985-11-04  Today's Date: 07/03/2020 SLP Individual Time: 1345-1435 SLP Individual Time Calculation (min): 50 min  Short Term Goals: Week 4: SLP Short Term Goal 1 (Week 4): Patient will verbalize orientation to place, time (month/year) and situation with Mod verbal cues. SLP Short Term Goal 2 (Week 4): Patient will demonstrate sustained attention to a functional task for 10 minutes with Mod verbal cues for redirection. SLP Short Term Goal 3 (Week 4): Patient will demonstrate functional problem solving for basic and familiar tasks with Mod verbal cues. SLP Short Term Goal 4 (Week 4): Patient will consume current diet with minimal overt s/s of aspiration and Min verbal cues for use of swallowing compensatory strategies. SLP Short Term Goal 5 (Week 4): Patient will demonstrate efficient mastication and complete oral clearance with trials of Dys. 3 textures over 2 sessions with Min verbal cues prior to upgrade.  Skilled Therapeutic Interventions: Skilled treatment session focused on dysphagia and cognitive goals. SLP facilitated session with providing trials of regular textures. Patient demonstrated efficient mastication but had overt coughing due to talking with a full cavity. Patient with constant verbalizations throughout the session, all of which were appropriate but repetitive in nature requiring SLP to talk over the patient at times. Patient's mother present and educated on patient's current diet recommendations and memory compensatory strategies. She verbalized understanding but will need reinforcement. Patient transferred to commode at end of session and continent of urine. Patient declined to get into enclosure bed at end of session as she wanted to wait for her family to return to go home. SLP attempted to redirect without success, therefore, patient left at RN station. Continue  with current plan of care.      Pain No/Denies Pain   Therapy/Group: Individual Therapy  Carter Kaman, [place] 07/03/2020, 3:09 PM

## 2020-07-03 NOTE — Progress Notes (Signed)
Physical Therapy Session Note  Patient Details  Name: Beth [name] MRN: [id number] Date of Birth: [birth year]  Today's Date: 07/03/2020 PT Individual Time: 1101-1159 and 6226-3335 PT Individual Time Calculation (min): 58 min and 43 min  Short Term Goals: Week 3:  PT Short Term Goal 1 (Week 3): Patient will participate in functional activities >10 min intervals during therapy sessions. PT Short Term Goal 2 (Week 3): Patient will perform transfers bed<>w/c with mod A consistently. PT Short Term Goal 3 (Week 3): Pt will ambulate 100' with modA +1. PT Short Term Goal 4 (Week 3): Pt will complete x4 steps with modA +1.  Skilled Therapeutic Interventions/Progress Updates:     Pt received seated in [facility] and agrees to therapy. Mother and husband present for family education. PT educates family on pt progress with therapy and answers questions regarding expectations for recovery and need for 24/7 supervision following DC. Pt performs sit to stand with minA +1. Pt then ambulates 400' with PT providing modA at L hip and husband providing minA/modA at R side. PT provides occasional cuing for pt to progress L leg but pt demos improvements in gait pattern relative to previous session. Pt has trendelenburg gait pattern on L but also demos improved L hip abductor activation. Pt then performs car transfer from wheel chair with modA HHA. WC transport back to room for time management. PT educates husband on guarding pt on affected L side, then pt ambulates additional 200' with husband providing assist on L side with PT providing assist on R. Pt left seated in [facility] with family present and safety belt intact.  2nd Session: Pt received supine in bed and agrees to therapy. No complaint of pain. Husband provides manual assistance for supine to sit transfer as well as stand pivot transfer to [facility] with PT providing cues on safe positioning and guarding technique. WC transport to therapy gym for time management and energy  conservation. Ambulation attempted with +1 assist. PT provides mod/maxA +1 at hips and pt ambulates x40', with multiple LOBs and ataxia with L leg.   Pt performs NMR for standing balance, symmetrical WB on R and L legs, activity tolerance, and L weight shifting. Pt stands with minA and then PT provides modA at hips to achieve neutral posture, facilitating L hip abductor activation and blocking L knee. Pt performs lateral weight shifting with multimodal cues for posture, utilizing mirror for visual feedback, and tactile cuing at L knee for WB. Pt then performs similar task with cognitive overlay, reaching for specifically colored clothespins on the L and then reaching to R to place clothespins on mirror. PT providing modA throughout.   Pt left seated in [facility] with husband and mother present.   Therapy Documentation Precautions:  Precautions Precautions: Fall,Other (comment) Precaution Comments: Lt radial head fx; PIP fx Rt little finger, and MCP fx Rt ring finger; unrestricted ROM LUE per Dr Doreatha Martin note 10/29 Required Braces or Orthoses: Splint/Cast Splint/Cast: ulnar gutter splint (RUE); Lt elbow bean bag splint for elbow extension;  Lt UE NWB; peg Restrictions Weight Bearing Restrictions: Yes RUE Weight Bearing: Non weight bearing LUE Weight Bearing: Non weight bearing   Therapy/Group: Individual Therapy  Breck Coons, PT, DPT 07/03/2020, 12:14 PM

## 2020-07-03 NOTE — Progress Notes (Addendum)
Patient ID: Beth [name], female   DOB: 1986/05/06, 34 y.o.   MRN: [id number]  SW made attempts to make contact pt DSS caseworker Bethel Born [facility] Worker [phone]) and sent email to:[email or web address]; requesting return phone call.   SW met with pt s/o [place]hawn to discuss plan of care for pt. SW to send referral to [facility] [phone]). SW reminded pt s/o to research SNF locations and HHA preferences in the event Acute Inpatient Rehab is not approved by program and/or insurance.   SW spoke with Angie/Admissions with [facility] [phone]) to discuss admission process, and insight to program. Reports nursing reviews referrals daily, and will discuss referral with an attending to determine if appropriate for program. [facility] SW will receive follow-up on decision. SW faxed referral.  SW spoke with Cathy/RN with [facility] [place] [phone]) to discuss clinicals faxed. Reports only received 9 pages. SW faxed clinicals again.   SW spoke with (K:[phone] Harrison/Admissions with [facility] [phone]) to inform referral received, and will follow -up on Monday or Tuesday next week after reviewing with a physician on if pt is appropriate for program.  Monday, MSW, [place] Office: [phone] Cell: [phone] Fax: [phone]

## 2020-07-03 NOTE — Plan of Care (Signed)
  Problem: Consults Goal: Skin Care Protocol Initiated - if Braden Score 18 or less Description: If consults are not indicated, leave blank or document N/A Outcome: Progressing Goal: Nutrition Consult-if indicated Outcome: Progressing   Problem: RH BOWEL ELIMINATION Goal: RH STG MANAGE BOWEL WITH ASSISTANCE Description: STG Manage Bowel with min Assistance. Outcome: Progressing   Problem: RH BLADDER ELIMINATION Goal: RH STG MANAGE BLADDER WITH ASSISTANCE Description: STG Manage Bladder With min Assistance Outcome: Progressing   Problem: RH SKIN INTEGRITY Goal: RH STG SKIN FREE OF INFECTION/BREAKDOWN Description: No new skin impairments during this admission Outcome: Progressing Goal: RH STG MAINTAIN SKIN INTEGRITY WITH ASSISTANCE Description: STG Maintain Skin Integrity With min Assistance. Outcome: Progressing   Problem: RH SAFETY Goal: RH STG ADHERE TO SAFETY PRECAUTIONS W/ASSISTANCE/DEVICE Description: STG Adhere to Safety Precautions With min Assistance/Device. Outcome: Progressing   Problem: RH PAIN MANAGEMENT Goal: RH STG PAIN MANAGED AT OR BELOW PT'S PAIN GOAL Description: <3 on a 0-10 pain scale Outcome: Progressing   Problem: RH KNOWLEDGE DEFICIT GENERAL Goal: RH STG INCREASE KNOWLEDGE OF SELF CARE AFTER HOSPITALIZATION Description: Patient will demonstrate knowledge of how to manage medications, dietary precautions, weight bearing precautions, skin care management, and follow up care with the MD with min assist from CIR staff post discharge. Outcome: Progressing   Problem: Consults Goal: RH BRAIN INJURY PATIENT EDUCATION Description: Description: See Patient Education module for eduction specifics Outcome: Progressing   Problem: Safety: Goal: Non-violent Restraint(s) Outcome: Progressing
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
## 2020-07-03 NOTE — Progress Notes (Signed)
Occupational Therapy Session Note  Patient Details  Name: Beth [name] MRN: [id number] Date of Birth: February 11, 1986  Today's Date: 07/03/2020 OT Individual Time: 1000-1100 OT Individual Time Calculation (min): 60 min   Short Term Goals: Week 4:  OT Short Term Goal 1 (Week 4): LTG=STG 2/2ELOS  Skilled Therapeutic Interventions/Progress Updates:    Patient greeted in enclosure bed and ready to get up with OT. Patient remembered therapists name today! Patient completed bed mobility with supervision. Stand-pivots bed>wc>toilet with mod A. Pt needed assist for clothing management and had continent void I in commode. Pt completed her peri-care with set-up. Pt brought to the sink for bathing and dressing as she did not want to shower. Pt's significant other and her mother entered room for family education. Educated family on how to assist with sit<>stands and BADLs at sink level. Pt's sig. Other assisted with standing while patient washed peri-area and buttocks. Clean brief donned, then pt needed mod A to thread pants and pull up over hips in standing. Educated on hemi dressing techniques but pt had difficulty with carryover of dressing L side first. Grooming tasks completed at the sink with focus on use of L hand as a stabilizer. Pt often asking to go home today, but was easier to redirect. Pt brought to dayroom for L UE NMR. Weight bearing towel pushes across table and cup stacking activity with pt able to activate L shoulder and elbow today to lift L arm up! L hand function improving with OT assist to open hand, then she could grasp cup. PROM of L supination/pronation. Pt returned to room for handoff to PT for next family education session.   Therapy Documentation Precautions:  Precautions Precautions: Fall,Other (comment) Precaution Comments: Lt radial head fx; PIP fx Rt little finger, and MCP fx Rt ring finger; unrestricted ROM LUE per Dr Doreatha Martin note 10/29 Required Braces or Orthoses:  Splint/Cast Splint/Cast: ulnar gutter splint (RUE); Lt elbow bean bag splint for elbow extension;  Lt UE NWB; peg Restrictions Weight Bearing Restrictions: Yes RUE Weight Bearing: Non weight bearing LUE Weight Bearing: Non weight bearing Pain: Pain Assessment Pain Scale: 0-10 Pain Score: 0-No pain   Therapy/Group: Individual Therapy  Valma Cava 07/03/2020, 11:13 AM

## 2020-07-04 NOTE — Plan of Care (Signed)
  Problem: Consults Goal: Skin Care Protocol Initiated - if Braden Score 18 or less Description: If consults are not indicated, leave blank or document N/A Outcome: Progressing Goal: Nutrition Consult-if indicated Outcome: Progressing   Problem: RH BOWEL ELIMINATION Goal: RH STG MANAGE BOWEL WITH ASSISTANCE Description: STG Manage Bowel with min Assistance. Outcome: Progressing   Problem: RH BLADDER ELIMINATION Goal: RH STG MANAGE BLADDER WITH ASSISTANCE Description: STG Manage Bladder With min Assistance Outcome: Progressing   Problem: RH SKIN INTEGRITY Goal: RH STG SKIN FREE OF INFECTION/BREAKDOWN Description: No new skin impairments during this admission Outcome: Progressing Goal: RH STG MAINTAIN SKIN INTEGRITY WITH ASSISTANCE Description: STG Maintain Skin Integrity With min Assistance. Outcome: Progressing   Problem: RH PAIN MANAGEMENT Goal: RH STG PAIN MANAGED AT OR BELOW PT'S PAIN GOAL Description: <3 on a 0-10 pain scale Outcome: Progressing   Problem: Safety: Goal: Non-violent Restraint(s) Outcome: Progressing   Problem: RH SAFETY Goal: RH STG ADHERE TO SAFETY PRECAUTIONS W/ASSISTANCE/DEVICE Description: STG Adhere to Safety Precautions With min Assistance/Device. Outcome: Not Progressing Note: Patient uncooperative. Physically and verbally abusive to staff. Patient uses bathroom as way to get out of enclosure bed. Patient unaware of safety measures and at the time unable to comprehend safety measures. Visitor is also unaware of safety measures and continues to open enclosure bed.   Problem: RH KNOWLEDGE DEFICIT GENERAL Goal: RH STG INCREASE KNOWLEDGE OF SELF CARE AFTER HOSPITALIZATION Description: Patient will demonstrate knowledge of how to manage medications, dietary precautions, weight bearing precautions, skin care management, and follow up care with the MD with min assist from CIR staff post discharge. Outcome: Not Progressing   Problem: Consults Goal: RH  BRAIN INJURY PATIENT EDUCATION Description: Description: See Patient Education module for eduction specifics Outcome: Not Progressing
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
## 2020-07-05 ENCOUNTER — Inpatient Hospital Stay (HOSPITAL_COMMUNITY): Payer: Medicaid Other | Admitting: Speech Pathology

## 2020-07-05 MED ORDER — METHYLPHENIDATE HCL 5 MG PO TABS
10.0000 mg | ORAL_TABLET | Freq: Two times a day (BID) | ORAL | Status: DC
  Administered 2020-07-06 – 2020-07-09 (×7): 10 mg
  Filled 2020-07-05 (×7): qty 2

## 2020-07-05 MED ORDER — QUETIAPINE FUMARATE 25 MG PO TABS
25.0000 mg | ORAL_TABLET | Freq: Three times a day (TID) | ORAL | Status: DC | PRN
  Administered 2020-07-05 – 2020-07-07 (×4): 25 mg
  Filled 2020-07-05 (×5): qty 1

## 2020-07-05 NOTE — Progress Notes (Signed)
Speech Language Pathology Daily Session Note  Patient Details  Name: Beth [name] MRN: [id number] Date of Birth: 1985-12-20  Today's Date: 07/05/2020 SLP Individual Time: 0850-0930 SLP Individual Time Calculation (min): 40 min  Short Term Goals: Week 4: SLP Short Term Goal 1 (Week 4): Patient will verbalize orientation to place, time (month/year) and situation with Mod verbal cues. SLP Short Term Goal 2 (Week 4): Patient will demonstrate sustained attention to a functional task for 10 minutes with Mod verbal cues for redirection. SLP Short Term Goal 3 (Week 4): Patient will demonstrate functional problem solving for basic and familiar tasks with Mod verbal cues. SLP Short Term Goal 4 (Week 4): Patient will consume current diet with minimal overt s/s of aspiration and Min verbal cues for use of swallowing compensatory strategies. SLP Short Term Goal 5 (Week 4): Patient will demonstrate efficient mastication and complete oral clearance with trials of Dys. 3 textures over 2 sessions with Min verbal cues prior to upgrade.  Skilled Therapeutic Interventions:  Pt was seen for skilled ST targeting cognition.  Upon arrival, pt was asking to get out of enclosure bed and "go for a walk."  Therapist explained that she couldn't take pt for a walk but she could push her around the unit in her wheelchair to which pt was agreeable.  Pt transferred to wheelchair with assist from nurse tech, seat belt alarm in place.  Pt was perseverative about wanting to go for a walk and intermittently attempted to remove her seat belt and stand up from wheelchair while unlocked.  Pt was initially redirectable and agreeable to being pushed around in wheelchair; however, with repeated attempts (~every 3-5 minutes) pt became increasingly agitated and began also perseverating on finding exits.  In light of increasing agitation, therapist brought pt back to her room to get her back into enclosure bed for safety.  Pt would not allow  therapist to assist her into bed and kept attempting to walk out of room.  Nurse tech was summoned and walked with pt and therapist in the hallway until they reached a recliner, at which point pt was again agreeable to sitting down.  Pt's RN arrived at this point and pt was left in the care of RN and nurse tech for transfer back to room. Continue per current plan of care.    Pain Pain Assessment Pain Scale: 0-10 Pain Score: 0-No pain  Therapy/Group: Individual Therapy  Oaklan Persons, Selinda Orion 07/05/2020, 12:19 PM
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
## 2020-07-05 NOTE — Plan of Care (Signed)
  Problem: RH BLADDER ELIMINATION Goal: RH STG MANAGE BLADDER WITH ASSISTANCE Description: STG Manage Bladder With min Assistance Outcome: Progressing   Problem: RH SKIN INTEGRITY Goal: RH STG SKIN FREE OF INFECTION/BREAKDOWN Description: No new skin impairments during this admission Outcome: Progressing Goal: RH STG MAINTAIN SKIN INTEGRITY WITH ASSISTANCE Description: STG Maintain Skin Integrity With min Assistance. Outcome: Progressing

## 2020-07-06 ENCOUNTER — Encounter (HOSPITAL_COMMUNITY): Payer: Medicaid Other | Admitting: Speech Pathology

## 2020-07-06 ENCOUNTER — Encounter (HOSPITAL_COMMUNITY): Payer: Medicaid Other

## 2020-07-06 ENCOUNTER — Ambulatory Visit (HOSPITAL_COMMUNITY): Payer: Medicaid Other

## 2020-07-06 LAB — CBC
HCT: 39.9 % (ref 36.0–46.0)
Hemoglobin: 12.7 g/dL (ref 12.0–15.0)
MCH: 28.9 pg (ref 26.0–34.0)
MCHC: 31.8 g/dL (ref 30.0–36.0)
MCV: 90.9 fL (ref 80.0–100.0)
Platelets: 367 10*3/uL (ref 150–400)
RBC: 4.39 MIL/uL (ref 3.87–5.11)
RDW: 13.2 % (ref 11.5–15.5)
WBC: 7 10*3/uL (ref 4.0–10.5)
nRBC: 0 % (ref 0.0–0.2)

## 2020-07-06 LAB — BASIC METABOLIC PANEL
Anion gap: 11 (ref 5–15)
BUN: 9 mg/dL (ref 6–20)
CO2: 23 mmol/L (ref 22–32)
Calcium: 9.6 mg/dL (ref 8.9–10.3)
Chloride: 106 mmol/L (ref 98–111)
Creatinine, Ser: 0.48 mg/dL (ref 0.44–1.00)
GFR, Estimated: >60 mL/min (ref >60)
Glucose, Bld: 98 mg/dL (ref 70–99)
Potassium: 4 mmol/L (ref 3.5–5.1)
Sodium: 140 mmol/L (ref 135–145)

## 2020-07-06 MED ORDER — PROPRANOLOL HCL 10 MG PO TABS
10.0000 mg | ORAL_TABLET | Freq: Three times a day (TID) | ORAL | Status: DC
  Administered 2020-07-06 – 2020-07-07 (×3): 10 mg
  Filled 2020-07-06 (×3): qty 1

## 2020-07-06 MED ORDER — OSMOLITE 1.5 CAL PO LIQD
355.0000 mL | Freq: Three times a day (TID) | ORAL | Status: DC
  Administered 2020-07-06: 355 mL
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
## 2020-07-06 NOTE — Progress Notes (Signed)
Physical Therapy Session Note  Patient Details  Name: Beth [name] MRN: [id number] Date of Birth: 02-Nov-1985  Today's Date: 07/06/2020 PT Individual Time: 1301-1415 PT Individual Time Calculation (min): 74 min   Short Term Goals: Week 3:  PT Short Term Goal 1 (Week 3): Patient will participate in functional activities >10 min intervals during therapy sessions. PT Short Term Goal 2 (Week 3): Patient will perform transfers bed<>w/c with mod A consistently. PT Short Term Goal 3 (Week 3): Pt will ambulate 100' with modA +1. PT Short Term Goal 4 (Week 3): Pt will complete x4 steps with modA +1.  Skilled Therapeutic Interventions/Progress Updates:     Pt received supine in bed and agrees to therapy. Intermittently throughout session, pt is oriented to place and situation, but at times pt perseverates on being in [place], [place]. No complaint of pain. Mother and husband present for family education. Pt performs supine to sit with minA. Stand pivot transfer to [facility] with min/modA. WC transport to therapy gym for time managemetn and energy conservation. PT discusses with mother PT"s recommendations of pt being at transfer level with family and only ambulating with therapy staff. This is reiterated with husband later in session. Both voice understanding. Education on safe transfer technique and avoiding pulling on L arm due to hemiparesis and risk of injury. PT demos stand pivot transfer for both mother and husband, independently, and family returns demonstration with PT cues on body mechanics, hand placement, and sequencing. Pt ambulates 180' with modA +2 with mother providing assistance and additional 72' with modA +2 and husband providing assistance. PT provides multimodal cues for posture, gait pattern, facilitation of L hip abductors, and additional cuing during turns. Pt performs NMR for standing balance and L lower extremity NM return, performing targeted stepping with R leg while PT blocks L leg. Pt  cued to step to 1 of 2 targets on 3 inch step with PT providing modA at hips for stability. Stand pivot to WC with modA +1. Pt left sitting in WC with husband present.  Therapy Documentation Precautions:  Precautions Precautions: Fall,Other (comment) Precaution Comments: Lt radial head fx; PIP fx Rt little finger, and MCP fx Rt ring finger; unrestricted ROM LUE per Dr Doreatha Martin note 10/29 Required Braces or Orthoses: Splint/Cast Splint/Cast: ulnar gutter splint (RUE); Lt elbow bean bag splint for elbow extension;  Lt UE NWB; peg Restrictions Weight Bearing Restrictions: Yes RUE Weight Bearing: Weight bearing as tolerated LUE Weight Bearing: Touch down weight bearing   Therapy/Group: Individual Therapy  Breck Coons, PT, DPT  07/06/2020, 3:55 PM
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
## 2020-07-06 NOTE — Progress Notes (Signed)
Speech Language Pathology Daily Session Note  Patient Details  Name: Beth [name] MRN: [id number] Date of Birth: 1986/03/12  Today's Date: 07/06/2020 SLP Individual Time: 1100-1155 SLP Individual Time Calculation (min): 55 min  Short Term Goals: Week 4: SLP Short Term Goal 1 (Week 4): Patient will verbalize orientation to place, time (month/year) and situation with Mod verbal cues. SLP Short Term Goal 2 (Week 4): Patient will demonstrate sustained attention to a functional task for 10 minutes with Mod verbal cues for redirection. SLP Short Term Goal 3 (Week 4): Patient will demonstrate functional problem solving for basic and familiar tasks with Mod verbal cues. SLP Short Term Goal 4 (Week 4): Patient will consume current diet with minimal overt s/s of aspiration and Min verbal cues for use of swallowing compensatory strategies. SLP Short Term Goal 5 (Week 4): Patient will demonstrate efficient mastication and complete oral clearance with trials of Dys. 3 textures over 2 sessions with Min verbal cues prior to upgrade.  Skilled Therapeutic Interventions: Skilled treatment session focused on cognitive goals. SLP facilitated session by transferring the patient to the wheelchair from the bed to maximize attention. SLP facilitated session by administering the Stewart Status Examination (SLUMS). Patient scored 13 /30 points with a score of 27 or above considered normal. SLP also initiated the use of a memory notebook to maximize recall and carryover of information in which she used with Mod verbal and visual cues. Patient consumed trials of Dys. 3 textures with use of efficient mastication and complete oral clearance without overt s/s of aspiration. Recommend trial tray prior to upgrade. Patient transferred back to bed and left with enclosure bed secured with all needs within reach and fiance present. Continue with current plan of care.       Pain No/Denies Pain    Therapy/Group: Individual Therapy  Alfred Harrel, [place] 07/06/2020, 12:21 PM

## 2020-07-06 NOTE — Plan of Care (Signed)
°  Problem: RH BOWEL ELIMINATION Goal: RH STG MANAGE BOWEL WITH ASSISTANCE Description: STG Manage Bowel with min Assistance. Outcome: Progressing   Problem: RH BLADDER ELIMINATION Goal: RH STG MANAGE BLADDER WITH ASSISTANCE Description: STG Manage Bladder With min Assistance Outcome: Progressing

## 2020-07-06 NOTE — Progress Notes (Addendum)
Patient ID: Beth [name], female   DOB: February 19, 1986, 34 y.o.   MRN: [id number]  SW spoke with Maudie Mercury Harrison/Admissions with [facility] [phone]) to follow up about referral. Reports will follow-up with physician and will follow-up. *SW received message from [place] with [facility] reporting Dr. Maureen Ralphs reviewed referral and did not feel insurance would approve pt for lateral transfer, and also did not further recommend acute inpatient rehab anymore. Recommended [facility], and willing to send over referral to help expedite process for patient.  Will fax referral  SW met with pt fiance, and mother to inform on above. SW reiterated to family to find IPR, SNFs, and HHAs of preference, in efforts to help with pt transition to [place]. SW spoke with several family members who intend to call andSW received the following locations:  [place] (p:[phone]/[phone]). SW faxed referral.  [place]/Jessica with Admissions [phone]). SW faxed referral. [facility] [phone]). SW spoke with Julie/Admissions who reported that pt would not be appropriate as requirement to be a senior.  HHAs [facility] [phone]). SW faxed referral to Tammy/Intake. *SW received message from [place] stating referral was declined. [employer]- Therapist, nutritional.  SW spoke with Murlean Caller. with [employer] of [place], [phone] to discuss any potential resources offered. States pt is eligible for [facility], but not aware of any private duty aide services. SW left message for pt DSS caseworker [facility] Worker [phone]) requesting follow-up. SW left message for on-call supervisor Cori Razor [phone]) to request assistance with getting follow-up on resources for patient. SW waiting on follow-up.   Loralee Pacas, MSW,  [place] Office: [phone] Cell: [phone] Fax: [phone]

## 2020-07-07 ENCOUNTER — Inpatient Hospital Stay (HOSPITAL_COMMUNITY): Payer: Medicaid Other

## 2020-07-07 ENCOUNTER — Inpatient Hospital Stay (HOSPITAL_COMMUNITY): Payer: Medicaid Other | Admitting: Occupational Therapy

## 2020-07-07 MED ORDER — OSMOLITE 1.5 CAL PO LIQD: 355.0000 mL | Freq: Three times a day (TID) | ORAL | Status: DC

## 2020-07-07 MED ORDER — PROPRANOLOL HCL 20 MG PO TABS
20.0000 mg | ORAL_TABLET | Freq: Three times a day (TID) | ORAL | Status: DC
  Administered 2020-07-07 – 2020-07-13 (×17): 20 mg
  Filled 2020-07-07 (×17): qty 1

## 2020-07-07 NOTE — Patient Care Conference (Signed)
Inpatient RehabilitationTeam Conference and Plan of Care Update Date: 07/07/2020   Time: 10:39 AM    Patient Name: Beth [name]      Medical Record Number: [id number]  Date of Birth: November 07, 1985 Sex: Female         Room/Bed: 4W14C/4W14C-01 Payor Info: Payor: [place] / Plan: BCBS MEDICAID ADVANTAGE / Product Type: *No Product type* /    Admit Date/Time:  06/11/2020  8:36 PM  Primary Diagnosis:  TBI (traumatic brain injury) [facility])  Hospital Problems: Principal Problem:   TBI (traumatic brain injury) ([place]) Active Problems:   Sinus tachycardia   Psychogenic fecal incontinence   S/P percutaneous endoscopic gastrostomy (PEG) tube placement ([place])   Sleep disturbance   Acute blood loss anemia   Hyperglycemia   Oral ulcer   PEG tube malfunction ([place])   Oral herpes   Labile blood glucose    Expected Discharge Date: Expected Discharge Date: 07/22/20  Team Members Present: Physician leading conference: Dr. Alger Simons Care Coodinator Present: Loralee Pacas, LCSWA;Hollister Wessler Creig Hines, RN, BSN, [place] Nurse Present: Other (comment) Dina Rich, RN) PT Present: Tereasa Coop, PT OT Present: Cherylynn Ridges, OT SLP Present: Weston Anna, SLP PPS Coordinator present : Ileana Ladd, Burna Mortimer, SLP     Current Status/Progress Goal Weekly Team Focus  Bowel/Bladder   continent/incontinent of B/B; BM 07/04/20  Pt to continue to being continent  Assess B/B q2h   Swallow/Nutrition/ Hydration   Dys. 2 textures with thin liquids, Min A  Min A  tolerance of current diet, use of strateiges, trials of Dys. 3 textures   ADL's   Min-mod A stand pivot transfers +2 for safety, Weak L gross grasp, mod A bathing and dressing, improved attention and balance. Agitation/hitting nursing staff  min/CGA overall  cognitive retraining, ADL retraining, self regulation, L NMR, balance   Mobility             Communication   Min A  Min A  Family Education    Safety/Cognition/ Behavioral Observations  Rancho Level VI-Mod A  Min-Mod A  Family Education   Pain   Pt with intermittent s/s of pain  Patient will not exhibit s/s or complaints of pain  Assess pain q shift and PRN   Skin   Healing abrasions to right hand  no new skin breakdown  Assess skin every shift and PRN     Discharge Planning:  Pt insurance will not approve any SNF locations in [place] . Family is possibly considering SNF placement here in [place]. TBD if pt will return to [facility] [place] to home with her fiance and three children. Support from mother and stepfather. Family is currently here in [place]. Family meeting held on 12/7. Plan is family to send pt to an acute IPR if possible. If not exploring SNFs and HHAs in the event IPR or SNFs are not an option. SW waiting on updates on preferred locations from. IPR referral sent to [facility]- declined as not appropriate and recommended outpatient therapy. IPR referral sent to [place], and SNF- [facility]; [facility]HA- Elder's helpers declined referral.   Team Discussion: Ritalin decreased over the past weekend. Continent B/B, cognition is improving. Patient is a Rancho VI, still has bouts of agitation. Team agrees patient needs more time here. Her goals are min assist to contact guard. She is a min-mod assist for stand pivot transfers, +2 for safety. Recommendation is to complete inpatient rehab here and discharge with outpatient therapy. Patient on  target to meet rehab goals: yes, with extra time.  *See Care Plan and progress notes for long and short-term goals.   Revisions to Treatment Plan:  Continue cognitive retraining Continue ADL retraining Continue safety protocols  Teaching Needs: Continue family education Continue BI education Continue safety education Continue wound care/dressing change  Continue weight bearing education  Current Barriers to Discharge: Decreased caregiver support, Home enviroment access/layout, Wound care,  Lack of/limited family support, Weight bearing restrictions, Behavior and Nutritional means  Possible Resolutions to Barriers: Continue current medications, offer nutritional support, educate on weight bearing precautions, education on wound care/dressing changes, provide emotional support to patient and family.     Medical Summary Current Status: gradual improvement cognitively and behaviorally. bp elevated, intermittent agitation---propranolol being titrated.  on diet, intake is picking up but sporadic based on intake  Barriers to Discharge: Medical stability   Possible Resolutions to [employer] Focus: ongoing cognitive behavioral mgt, nutritional rx, daily review of patient data, labs   Continued Need for Acute Rehabilitation Level of Care: The patient requires daily medical management by a physician with specialized training in physical medicine and rehabilitation for the following reasons: Direction of a multidisciplinary physical rehabilitation program to maximize functional independence : Yes Medical management of patient stability for increased activity during participation in an intensive rehabilitation regime.: Yes Analysis of laboratory values and/or radiology reports with any subsequent need for medication adjustment and/or medical intervention. : Yes   I attest that I was present, lead the team conference, and concur with the assessment and plan of the team.   Cristi Loron 07/07/2020, 3:14 PM

## 2020-07-07 NOTE — Plan of Care (Signed)
°  Problem: RH BOWEL ELIMINATION Goal: RH STG MANAGE BOWEL WITH ASSISTANCE Description: STG Manage Bowel with min Assistance. Outcome: Progressing   Problem: RH BLADDER ELIMINATION Goal: RH STG MANAGE BLADDER WITH ASSISTANCE Description: STG Manage Bladder With min Assistance Outcome: Progressing   Problem: Safety: Goal: Non-violent Restraint(s) Outcome: Progressing

## 2020-07-07 NOTE — Progress Notes (Signed)
Physical Therapy Session Note  Patient Details  Name: Beth [name] MRN: [id number] Date of Birth: [birth year]  Today's Date: 07/07/2020 PT Individual Time: 7897-8478 PT Treatment Time: 27 min  Short Term Goals: Week 3:  PT Short Term Goal 1 (Week 3): Patient will participate in functional activities >10 min intervals during therapy sessions. PT Short Term Goal 2 (Week 3): Patient will perform transfers bed<>w/c with mod A consistently. PT Short Term Goal 3 (Week 3): Pt will ambulate 100' with modA +1. PT Short Term Goal 4 (Week 3): Pt will complete x4 steps with modA +1.  Skilled Therapeutic Interventions/Progress Updates:     Pt received supine in bed and agrees to therapy. No complaint ot pain. Supine to sit with supervision for safety and cues on positioning at EOB. minA for sit to stand and stand step tranfser to [facility]. Pt transported in [facility] to therapy gym for time management. Pt ambulates >400' with HHA. Majority of distance pt ambulates with modA +1 but pt required increased assistance during 180 degrees turns due to difficulty sequencing steps and crossing over. PT provides consistent cuing for L stride length and reminder to attend to L hemibody. Pt requires extended seated rest break following ambulation. Pt then performs NMR for balance with cognitive overlay, tasked with retrieving horseshoes of specific color and placing in box across the room. Pt apparently fatigued during activity and requiring increased assistance with decreased attention to task. Several seated and supine rest breaks taken for recovery. Pt then ambulates 300' back to room with modA +1. Left supine in bed with family present.   Therapy Documentation Precautions:  Precautions Precautions: Fall,Other (comment) Precaution Comments: Lt radial head fx; PIP fx Rt little finger, and MCP fx Rt ring finger; unrestricted ROM LUE per Dr Doreatha Martin note 10/29 Required Braces or Orthoses: Splint/Cast Splint/Cast: ulnar gutter  splint (RUE); Lt elbow bean bag splint for elbow extension;  Lt UE NWB; peg Restrictions Weight Bearing Restrictions: Yes RUE Weight Bearing: Weight bearing as tolerated LUE Weight Bearing: Touch down weight bearing    Therapy/Group: Individual Therapy  Breck Coons 07/07/2020, 8:41 PM

## 2020-07-07 NOTE — Progress Notes (Signed)
Speech Language Pathology Daily Session Note  Patient Details  Name: Beth [name] MRN: [id number] Date of Birth: 1986-05-29  Today's Date: 07/07/2020 SLP Individual Time: 0815-0910 SLP Individual Time Calculation (min): 55 min  Short Term Goals: Week 4: SLP Short Term Goal 1 (Week 4): Patient will verbalize orientation to place, time (month/year) and situation with Mod verbal cues. SLP Short Term Goal 2 (Week 4): Patient will demonstrate sustained attention to a functional task for 10 minutes with Mod verbal cues for redirection. SLP Short Term Goal 3 (Week 4): Patient will demonstrate functional problem solving for basic and familiar tasks with Mod verbal cues. SLP Short Term Goal 4 (Week 4): Patient will consume current diet with minimal overt s/s of aspiration and Min verbal cues for use of swallowing compensatory strategies. SLP Short Term Goal 5 (Week 4): Patient will demonstrate efficient mastication and complete oral clearance with trials of Dys. 3 textures over 2 sessions with Min verbal cues prior to upgrade.  Skilled Therapeutic Interventions: Skilled treatment session focused on cognitive goals. SLP facilitated session by providing Min A verbal cues for problem solving during basic self-care tasks of toileting and oral care. Patient participated in a basic medication management task with Mod A verbal cues needed for problem solving and Min A verbal cues for attention to task. SLP provided the patient with a list of her current medications and their functions in which she was able to utilize to answer questions with Min verbal cues. Patient recalled events from therapy session to record in her memory notebook with Mod verbal cues. Patient transferred back to bed at end of session and left secured in enclosure bed. Continue with current plan of care.      Pain No/Denies Pain   Therapy/Group: Individual Therapy  Beth [name], [place] 07/07/2020, 3:28 PM
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
## 2020-07-07 NOTE — Progress Notes (Signed)
Patient ID: Beth [name], female   DOB: 03-May-1986, 34 y.o.   MRN: [id number]  SW left message for [facility] [phone]) with regard to referral and status. SW waiting on follow-up.  *SW received updates reporting unable to accept referral as they are closed and accepting any new admissions due to [place]. States referring all patients to [facility].   SW left message for Beth [name]/Admissions with [facility] [phone]) about referrals and status. SW waiting on follow-up. *SW received message from [facility] reporting pt declined as they are full and do not have any Medicaid beds.  SW met with [name] and Beth [name] with family member Beth [name] on speaker phone to provide updates from team conference, and change in d/c date to 12/29. SW provided updates on above. Both mother and fiance report they are being told that [facility] is an option for pt. SW indicated will have to speak with Kim/Admissions with [facility] to explore further as this information was not provided to SW. SW discussed the importance of having other resources available in the event [facility] is not an option. Family to work on resources for SW to explore. SW informed continuing to have issues with getting in contact with pt Medicaid worker.  Loralee Pacas, MSW, [place] Office: [phone] Cell: [phone] Fax: [phone]

## 2020-07-07 NOTE — Progress Notes (Signed)
Occupational Therapy Session Note  Patient Details  Name: Beth [name] MRN: [id number] Date of Birth: 1985-11-25  Today's Date: 07/07/2020 OT Individual Time: 7340-3709 OT Individual Time Calculation (min): 88 min   Short Term Goals: Week 4:  OT Short Term Goal 1 (Week 4): LTG=STG 2/2ELOS  Skilled Therapeutic Interventions/Progress Updates:    Patient greeted semi-reclined in bed and agreeable to OT treatment session. Pt very agreeable today saying "i'll do whatever you tell me to do" throughout session. Pt brought into bathroom for shower. Pt completed stand-pivots with minA to the R and mod A to the L, +2 present for safety but not needed for transfers. Pt needed OT assist to doff pants and shirt. L NMR in shower with weight bearing to push and pull wash cloth along R arm. Dressing with focus on recall of hemi-dressing techniques, but still needed max cues to try to dress the L side first. Educated on use of L hand as a stabilizer to hold toothbrush while donning toothpaste. Pt brought do therapy gym and worked on [place] [address] with weight bearing towel pushes. Gentle ROM of L should/elbow/wrist/hand and focus on supination. B UE task of chest press with unweighted bar, OT assist to maintain grip on bar. Pt returned to room for lunch. Pt needed set-up A and min verbal cues to slow down, but ate 100% of her plate! Pt transferred back to bed with min A and left semi-reclined in enclosure bed with needs met as significant other entered the room.  Therapy Documentation Precautions:  Precautions Precautions: Fall,Other (comment) Precaution Comments: Lt radial head fx; PIP fx Rt little finger, and MCP fx Rt ring finger; unrestricted ROM LUE per Dr Doreatha Martin note 10/29 Required Braces or Orthoses: Splint/Cast Splint/Cast: ulnar gutter splint (RUE); Lt elbow bean bag splint for elbow extension;  Lt UE NWB; peg Restrictions Weight Bearing Restrictions: Yes RUE Weight Bearing: Weight bearing as  tolerated LUE Weight Bearing: Touch down weight bearing Pain: Pain Assessment Pain Scale: 0-10 Pain Score: 0-No pain   Therapy/Group: Individual Therapy  Valma Cava 07/07/2020, 12:20 PM

## 2020-07-08 ENCOUNTER — Encounter (HOSPITAL_COMMUNITY): Payer: Medicaid Other | Admitting: Speech Pathology

## 2020-07-08 ENCOUNTER — Ambulatory Visit (HOSPITAL_COMMUNITY): Payer: Medicaid Other

## 2020-07-08 ENCOUNTER — Encounter (HOSPITAL_COMMUNITY): Payer: Medicaid Other

## 2020-07-08 MED ORDER — FREE WATER
50.0000 mL | Freq: Two times a day (BID) | Status: DC
  Administered 2020-07-08 – 2020-07-22 (×28): 50 mL

## 2020-07-08 NOTE — Progress Notes (Signed)
Patient became a little agitated at 1400, encouraged patient to remain calm and helped patient back to bed. No further complications noted at this time.  Audie Clear, LPN

## 2020-07-08 NOTE — Progress Notes (Signed)
Pt slept throughout the night with no complaints. RN will continue to monitor pt.

## 2020-07-08 NOTE — Progress Notes (Signed)
Patient ID: Beth [name], female   DOB: 1985/11/27, 34 y.o.   MRN: [id number]  SW left message for Beth [name] [phone]) requesting return phone call to get assistance with pt care needs.   SW left message for Beth [name]/Admissions with Beth [name] at [facility] [phone]) to discuss if there were any changes with accepting pt into program. SW waiting on follow-up.  *SW spoke with Beth Mercury to get updates on if pt is eligible for program. Reports pt is not eligible for IPR for further therapies required, and recommendation for outpatient therapy at [facility].  SW met with [name], and [name] in room to provide updates from [employer]. Family reports they spoke with her again yesterday, and were informed this location could be an option for her,and would like to hear from her directly that this is not an option. SW spoke with Beth Mercury and went into room during SLP session to have conference call with family for Beth Mercury to discuss above with regard to decline to IPR at [facility]. They were encouraged to file claim with auto insurance as this will allow for pt to have more services offered through auto insurance. Beth [name] reiterated she will assist SW with getting pt scheduled at [facility] outpatient clinic. Fiance reports that they intend to meet with lawyer on Friday to finalize claim. He understands that he should look for private aide service in the interim in efforts to plan for pt to discharge to home. How pt will get home is to TBD, pending what happens with auto claim and benefits offered. Beth [name] to work on linking SW to other professionals in area to help coordinate a list of resources that may be useful for pt.   SW completed [facility] outpatient clinic referral requesting the following services: Pt able to get w/c eval through [facility] outpatient clinic. PT recommends transport chair until speciality  w/c obtained.   SW faxed referral with clinicals to [phone].  SW spoke with [facility], who reported fax not received. SW to follow-up.  Beth [name], MSW, [place] Office: [phone] Cell: [phone] Fax: [phone]

## 2020-07-08 NOTE — Progress Notes (Signed)
Occupational Therapy Session Note  Patient Details  Name: Beth [name] MRN: [id number] Date of Birth: 1986/06/13  Today's Date: 07/08/2020 OT Individual Time: 0945-1100 OT Individual Time Calculation (min): 75 min    Short Term Goals: Week 4:  OT Short Term Goal 1 (Week 4): LTG=STG 2/2ELOS  Skilled Therapeutic Interventions/Progress Updates:    Pt received supine with no c/o pain, agreeable to OT session. Pt completed bed mobility with supervision. Stand pivot transfer to the w/c with min A. She was taken to the therapy gym. Pt completed stand pivot to the mat with min A. Pt completed LUE weightbearing activity with RUE reaching activity with mod facilitation at the L elbow to facilitate full arm extension. Supervision for EOM sitting balance overall. Pt transitioned into supine and then sidelying for increased visual attention the the LUE as she completed elbow flex/ext. [name] was applied to first the biceps and then triceps for increased NMR and activation. Parametres as described below. No c/o pain or skin concerns before/after application. Pt returned to her room, mother and husband now present for shower. Min A ambulatory transfer into the bathroom with mod cueing for doffing clothing. Pt completed UB bathing with min A for thoroughness, min A for standing balance during LB bathing. Pt dryed off with mod A to ensure safety. Pt completed UB dressing with min A and LB dressing and mod A, assist from mother. Pt returned to TIS w/c and was handed off to SLP in room.   Saebo Stim One 330 pulse width 35 Hz pulse rate On 8 sec/ off 8 sec Ramp up/ down 2 sec Symmetrical Biphasic wave form  Max intensity 141mA at 500 Ohm load   Therapy Documentation Precautions:  Precautions Precautions: Fall,Other (comment) Precaution Comments: Lt radial head fx; PIP fx Rt little finger, and MCP fx Rt ring finger; unrestricted ROM LUE per Dr Doreatha Martin note 10/29 Required Braces or Orthoses:  Splint/Cast Splint/Cast: ulnar gutter splint (RUE); Lt elbow bean bag splint for elbow extension;  Lt UE NWB; peg Restrictions Weight Bearing Restrictions: Yes RUE Weight Bearing: Weight bearing as tolerated LUE Weight Bearing: Touch down weight bearing Therapy/Group: Individual Therapy  [name] 07/08/2020, 6:54 AM
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
## 2020-07-08 NOTE — Progress Notes (Signed)
Physical Therapy Weekly Progress Note  Patient Details  Name: Beth [name] MRN: [id number] Date of Birth: 1986-01-16  Beginning of progress report period: June 30, 2020 End of progress report period: July 08, 2020   Today's Date: 07/08/2020 PT Individual Time: 1300-1410 PT Individual Time Calculation (min): 70 min   Patient has met 2 of 4 short term goals.  Pt is progressing toward mobility goals with improved cognition and corresponding improvements in ability to participate and carryover treatment strategies. Pt now performing bed mobility consistently at supervision to CGA level, sit to stand and stand pivot transfers minA at times but still requiring modA when pt is fatigued or distracted. Pt ambulating up to 150' with modA +1 but occasionally requiring +2 assist, especially with turns and ambulating in crowded environment.   Patient continues to demonstrate the following deficits muscle weakness, decreased cardiorespiratoy endurance, motor apraxia, decreased attention, decreased problem solving, decreased safety awareness and decreased memory and decreased sitting balance, decreased standing balance, decreased postural control and decreased balance strategies and therefore will continue to benefit from skilled PT intervention to increase functional independence with mobility.  Patient progressing toward long term goals..  Continue plan of care.  PT Short Term Goals Week 3:  PT Short Term Goal 1 (Week 3): Patient will participate in functional activities >10 min intervals during therapy sessions. PT Short Term Goal 1 - Progress (Week 3): Met PT Short Term Goal 2 (Week 3): Patient will perform transfers bed<>w/c with mod A consistently. PT Short Term Goal 2 - Progress (Week 3): Met PT Short Term Goal 3 (Week 3): Pt will ambulate 100' with modA +1. PT Short Term Goal 3 - Progress (Week 3): Progressing toward goal PT Short Term Goal 4 (Week 3): Pt will complete x4 steps with modA  +1. PT Short Term Goal 4 - Progress (Week 3): Progressing toward goal Week 4:  PT Short Term Goal 1 (Week 4): Pt will ambulate 100' consistently with modA. PT Short Term Goal 2 (Week 4): Pt will complete x4 steps with modA +1. PT Short Term Goal 3 (Week 4): Pt will perform bed to chair transfer consistently with minA.  Skilled Therapeutic Interventions/Progress Updates:  Ambulation/gait training;Discharge planning;Functional mobility training;Psychosocial support;Therapeutic Activities;Visual/perceptual remediation/compensation;Balance/vestibular training;Disease management/prevention;Neuromuscular re-education;Skin care/wound management;Therapeutic Exercise;Wheelchair propulsion/positioning;Cognitive remediation/compensation;DME/adaptive equipment instruction;Pain management;Splinting/orthotics;UE/LE Strength taining/ROM;Community reintegration;Functional electrical stimulation;Patient/family education;Stair training;UE/LE Coordination activities   Pt received supine in bed and agrees to therapy. No complaint of pain. Husband and mother present for family education. Supine to sit with supervision and increased time required to complete. Stand pivot transfer to Tilt in space WC with minA. WC transport to gym for time management. Pt performs stair training with family observing. PT cues on use of gait belt for safety. Pt then performs x4 6" steps with Right hand rail and PT providing minA and cues for sequencing and optimal foot placement. x2 additional bouts with seated rest breaks in between. Pt then ascends 4 6" steps and descends 8 3" steps with minA. Pt requires increased time and cuing with fatigue. Seated rest break prior to ambulation. Pt ambulates in skywalk toward [employer]	[place], x150', with HHA modA and cues for L foot swing and increasing heel strike on initial contact. Pt sits and has conversation with family members during rest break, then ambulates additional 150'. Pt performs ambulatory transfer  to toilet with mina/modA and is continent of urine. Following toileting, pt stands at sink with minA and cued to wash hands. Pt left supine in bed with  all needs within reach.  Therapy Documentation Precautions:  Precautions Precautions: Fall,Other (comment) Precaution Comments: Lt radial head fx; PIP fx Rt little finger, and MCP fx Rt ring finger; unrestricted ROM LUE per Dr Doreatha Martin note 10/29 Required Braces or Orthoses: Splint/Cast Splint/Cast: ulnar gutter splint (RUE); Lt elbow bean bag splint for elbow extension;  Lt UE NWB; peg Restrictions Weight Bearing Restrictions: Yes RUE Weight Bearing: Weight bearing as tolerated LUE Weight Bearing: Touch down weight bearing   Therapy/Group: Individual Therapy  Breck Coons, PT, DPT 07/08/2020, 12:58 PM

## 2020-07-08 NOTE — Progress Notes (Signed)
Speech Language Pathology Weekly Progress and Session Note  Patient Details  Name: Beth [name] MRN: [id number] Date of Birth: 01-Feb-1986  Beginning of progress report period: July 01, 2020 End of progress report period: July 08, 2020  Today's Date: 07/08/2020 SLP Individual Time: 1100-1210 SLP Individual Time Calculation (min): 70 min  Short Term Goals: Week 4: SLP Short Term Goal 1 (Week 4): Patient will verbalize orientation to place, time (month/year) and situation with Mod verbal cues. SLP Short Term Goal 1 - Progress (Week 4): Met SLP Short Term Goal 2 (Week 4): Patient will demonstrate sustained attention to a functional task for 10 minutes with Mod verbal cues for redirection. SLP Short Term Goal 2 - Progress (Week 4): Met SLP Short Term Goal 3 (Week 4): Patient will demonstrate functional problem solving for basic and familiar tasks with Mod verbal cues. SLP Short Term Goal 3 - Progress (Week 4): Met SLP Short Term Goal 4 (Week 4): Patient will consume current diet with minimal overt s/s of aspiration and Min verbal cues for use of swallowing compensatory strategies. SLP Short Term Goal 4 - Progress (Week 4): Met SLP Short Term Goal 5 (Week 4): Patient will demonstrate efficient mastication and complete oral clearance with trials of Dys. 3 textures over 2 sessions with Min verbal cues prior to upgrade. SLP Short Term Goal 5 - Progress (Week 4): Met    New Short Term Goals: Week 5: SLP Short Term Goal 1 (Week 5): Patient will verbalize orientation to place, time (month/year) and situation with Min verbal and visual cues. SLP Short Term Goal 2 (Week 5): Patient will demonstrate sustained attention to a functional task for 20 minutes with Mod verbal cues for redirection. SLP Short Term Goal 3 (Week 5): Patient will demonstrate functional problem solving for basic and familiar tasks with Min verbal cues. SLP Short Term Goal 4 (Week 5): Patient will consume current diet with  minimal overt s/s of aspiration and Supervision verbal cues for use of swallowing compensatory strategies. SLP Short Term Goal 5 (Week 5): Patient will demonstrate efficient mastication and complete oral clearance with trials of regular textures over 2 sessions with Min verbal cues prior to upgrade.  Weekly Progress Updates: Patient continues to make functional gains and has met 5 of 5 STGs this reporting period. Currently, patient is consuming Dys. 3 textures with thin liquids with minimal overt s/s of aspiration and overall Min A verbal cues for use of swallowing compensatory strategies. Patient demonstrates behaviors most consistent with a Rancho Level VI and requires overall Mod A verbal cues to complete functional and familiar tasks safely in regards to problem solving, attention, awareness and recall. Patient and family education is ongoing. Patient would benefit from continued skilled SLP intervention to maximize her cognitive and swallowing function and overall functional independence prior to discharge.      Intensity: Minumum of 1-2 x/day, 30 to 90 minutes Frequency: 3 to 5 out of 7 days Duration/Length of Stay: 07/22/20 Treatment/Interventions: Cognitive remediation/compensation;Cueing hierarchy;Environmental controls;Functional tasks;Internal/external aids;Dysphagia/aspiration precaution training;Patient/family education;Therapeutic Activities   Daily Session  Skilled Therapeutic Interventions: Skilled treatment session focused on cognitive and dysphagia goals as well as family education. SLP facilitated session by providing education regarding patient's current cognitive function and need to discharge home vs transfer to another inpatient rehabilitation facility. Conversation focused on patient's current progress and expected need to help facilitate recovery at next stage of recovery at home with increased focus on function and ownership of care. Also dicussed the likely  prolonged need  for 24 hour supervision.  Both the patient's mother and fiance verbalized understanding. Patient performed basic self-care tasks with supervision verbal cues and recalled events from previous therapy sessions with overall Mod A question cues. Patient demonstrated increased attention to task and decreased verbsoity. SLP took the patient and her fiance to the cafeteria to order a sandwich for trials of Dys. 3 textures. Patient demonstrated efficient mastication with complete oral clearance without overt s/s of aspiration with Min verbal cues needed for use of small bites/sips. Recommend patient upgrade to Dys. 3 textures. Patient left upright in TIS wheelchair with fiance present. Continue with current plan of care.         Pain No/Denies Pain   Therapy/Group: Individual Therapy  Beth [name] 07/08/2020, 6:28 AM

## 2020-07-09 ENCOUNTER — Inpatient Hospital Stay (HOSPITAL_COMMUNITY): Payer: Medicaid Other | Admitting: Speech Pathology

## 2020-07-09 ENCOUNTER — Ambulatory Visit (HOSPITAL_COMMUNITY): Payer: Medicaid Other

## 2020-07-09 ENCOUNTER — Encounter (HOSPITAL_COMMUNITY): Payer: Medicaid Other | Admitting: Occupational Therapy

## 2020-07-09 MED ORDER — METHOCARBAMOL 500 MG PO TABS
500.0000 mg | ORAL_TABLET | Freq: Three times a day (TID) | ORAL | Status: DC | PRN
  Administered 2020-07-10 – 2020-07-11 (×2): 500 mg via ORAL
  Filled 2020-07-09 (×2): qty 1

## 2020-07-09 MED ORDER — QUETIAPINE FUMARATE 25 MG PO TABS
25.0000 mg | ORAL_TABLET | Freq: Three times a day (TID) | ORAL | Status: DC | PRN
  Administered 2020-07-09 – 2020-07-20 (×9): 25 mg via ORAL
  Filled 2020-07-09 (×9): qty 1

## 2020-07-09 MED ORDER — MELATONIN 3 MG PO TABS
3.0000 mg | ORAL_TABLET | Freq: Every day | ORAL | Status: DC
  Administered 2020-07-09 – 2020-07-21 (×13): 3 mg via ORAL
  Filled 2020-07-09 (×13): qty 1

## 2020-07-09 MED ORDER — MEGESTROL ACETATE 400 MG/10ML PO SUSP
400.0000 mg | Freq: Two times a day (BID) | ORAL | Status: DC
  Administered 2020-07-09 – 2020-07-10 (×2): 400 mg via ORAL
  Filled 2020-07-09 (×2): qty 10

## 2020-07-09 MED ORDER — POLYETHYLENE GLYCOL 3350 17 G PO PACK
17.0000 g | PACK | Freq: Every day | ORAL | Status: DC | PRN
  Administered 2020-07-11: 17 g via ORAL
  Filled 2020-07-09: qty 1

## 2020-07-09 MED ORDER — PANTOPRAZOLE SODIUM 40 MG PO TBEC
40.0000 mg | DELAYED_RELEASE_TABLET | Freq: Every day | ORAL | Status: DC
  Administered 2020-07-10 – 2020-07-22 (×13): 40 mg via ORAL
  Filled 2020-07-09 (×13): qty 1

## 2020-07-09 MED ORDER — METHYLPHENIDATE HCL 5 MG PO TABS
10.0000 mg | ORAL_TABLET | Freq: Two times a day (BID) | ORAL | Status: DC
  Administered 2020-07-09 – 2020-07-17 (×16): 10 mg via ORAL
  Filled 2020-07-09 (×16): qty 2

## 2020-07-09 MED ORDER — QUETIAPINE FUMARATE 50 MG PO TABS
100.0000 mg | ORAL_TABLET | Freq: Every day | ORAL | Status: DC
  Administered 2020-07-09 – 2020-07-13 (×5): 100 mg via ORAL
  Filled 2020-07-09 (×5): qty 2

## 2020-07-09 MED ORDER — QUETIAPINE FUMARATE 50 MG PO TABS
50.0000 mg | ORAL_TABLET | Freq: Every evening | ORAL | Status: DC | PRN
  Administered 2020-07-10 – 2020-07-21 (×3): 50 mg via ORAL
  Filled 2020-07-09 (×3): qty 1
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
## 2020-07-09 NOTE — Progress Notes (Signed)
Physical Therapy Session Note  Patient Details  Name: Beth [name] MRN: [id number] Date of Birth: September 04, 1985  Today's Date: 07/09/2020 PT Individual Time: 8786-7672 PT Individual Time Calculation (min): 72 min   Short Term Goals: Week 4:  PT Short Term Goal 1 (Week 4): Pt will ambulate 100' consistently with modA. PT Short Term Goal 2 (Week 4): Pt will complete x4 steps with modA +1. PT Short Term Goal 3 (Week 4): Pt will perform bed to chair transfer consistently with minA.  Skilled Therapeutic Interventions/Progress Updates:    Pt received seated in [facility] and agrees to therapy. No complaint of pain. Mother and husband present for family education. WC transport to atrium for gait training in open environment for community reintegration and to provide increased external stimuli to incorporate into gait training and balance. Pt ambulates throughout session with modA +1, requiring slightly increased assistance during turns due to difficulty sequencing steps and L sided neglect. Pt ambulates in gift shop and tasked with finding different objects throughout visual field. Pt able to find all objects with increased time. Pt then ambulates 300', performing multiple turns and navigating around obstacles. Extended seated rest break. Pt taken outside for gait training over surfaces with varying grades and textures. Pt ambulates 150' with consistent cues to attend to L leg and maintain upright gaze to improve posture and balance. Pt ambulates 250' with task of finding occupational therapist. Pt taken back inside and performs step up onto scale with modA and cues on sequencing. Stand pivot back to bed with minA/modA. Left supine in bed with all needs within reach.  Therapy Documentation Precautions:  Precautions Precautions: Fall,Other (comment) Precaution Comments: Lt radial head fx; PIP fx Rt little finger, and MCP fx Rt ring finger; unrestricted ROM LUE per Dr Doreatha Martin note 10/29 Required Braces or  Orthoses: Splint/Cast Splint/Cast: ulnar gutter splint (RUE); Lt elbow bean bag splint for elbow extension;  Lt UE NWB; peg Restrictions Weight Bearing Restrictions: Yes RUE Weight Bearing: Weight bearing as tolerated LUE Weight Bearing: Touch down weight bearing   Therapy/Group: Individual Therapy  Breck Coons, PT, DPT 07/09/2020, 4:01 PM

## 2020-07-09 NOTE — Progress Notes (Signed)
Patient ID: Beth [name], female   DOB: 11-Dec-1985, 34 y.o.   MRN: [id number]  SW returned phone call to Beth [name]/DHS Supervisor with [facility] [phone]) to explain on assistance being requested. Reports their DHS only manages if someone is approved for Medicaid, and would not know of any resources. States pt assigned CM-Beth [name] is out on sick leave since last week Thursday. No further follow-up needed with DHS.  SW spoke with [facility] [phone]) reporting outpatient clinic referral received. States she will follow-up with fiance about scheduling appointments.   SW spoke with Marilyn/Outpatient SW with [facility] [phone]) who has been helpful with providing resources for sub-acute rehab or IPR locations. She stated it was ok with providing her contact information to pt fiance for follow-up. SW met with pt fiance in room to inform on above, and to give contact information for [facility] bed staff. Reports he would like to take her home, but still is undecided. SW provided SNF list. SW also informed on other referrals that will be sent out.   *SW received phone call from [facility] that requested a new order with MD signature as unable to accept PA.  *SW faxed new order with attending signature [phone].  SW submitted online referrals and faxed referrals to the following locations:  1) [facility] ([phone]/f:[phone]) *SW spoke with Beth [name] with [facility] [phone]) who reported they can hold referral, but not able to consider pt for admission until auto claim is processed.   2) [facility] (p:[phone]) *SW spoke with Beth [name] [phone]) who was following up about online referral. SW informed referral has been faxed. Will be on the lookout.   3)[facility] ([phone]/f:[phone]). Referral faxed and waiting on follow-up.  4) [facility] ([phone]/f:[phone]) *SW spoke wit Beth [name] who reported she spoke with pt fiance, and was informed she is waiting on claim number. This is the only way they can process the referral and consider if she is appropriate for placement.   5) [facility] (p:[phone]/f:[phone]).  *SW spoke with Beth [name]/ADmissions who reported referral was received. Indicated they do not accept any Medicaid referral, and they would need to have an auto claim before being considered. Will hold referral until their is an update.   6) [facility] (p:[phone]/[phone]) *SW spoke with Beth [name]/Admissions [phone]) who was responding to online referral. SW informed referral faxed. Stated she will follow-up once received.   7) [facility] ([phone]). Referral faxed.  Beth [name], MSW, [place] Office: [phone] Cell: [phone] Fax: [phone]

## 2020-07-09 NOTE — Progress Notes (Signed)
Speech Language Pathology Daily Session Note  Patient Details  Name: Beth [name] MRN: [id number] Date of Birth: Sep 18, 1985  Today's Date: 07/09/2020 SLP Individual Time: 1100-1155 SLP Individual Time Calculation (min): 55 min  Short Term Goals: Week 5: SLP Short Term Goal 1 (Week 5): Patient will verbalize orientation to place, time (month/year) and situation with Min verbal and visual cues. SLP Short Term Goal 2 (Week 5): Patient will demonstrate sustained attention to a functional task for 20 minutes with Mod verbal cues for redirection. SLP Short Term Goal 3 (Week 5): Patient will demonstrate functional problem solving for basic and familiar tasks with Min verbal cues. SLP Short Term Goal 4 (Week 5): Patient will consume current diet with minimal overt s/s of aspiration and Supervision verbal cues for use of swallowing compensatory strategies. SLP Short Term Goal 5 (Week 5): Patient will demonstrate efficient mastication and complete oral clearance with trials of regular textures over 2 sessions with Min verbal cues prior to upgrade.  Skilled Therapeutic Interventions: Skilled treatment session focused on cognitive goals. SLP facilitated session by providing overall Min-Mod A verbal cues for patient to self-monitor and correct errors during a complex medication management task. Mod A verbal cues were also needed for selective attention to task in a moderately distracting environment. Patient left upright in TIS wheelchair with family present. Continue with current plan of care.      Pain Pain at PEG tube sit intermittently, patient repositioned and RN aware.   Therapy/Group: Individual Therapy  [name] 07/09/2020, 12:57 PM

## 2020-07-09 NOTE — Progress Notes (Signed)
Occupational Therapy Weekly Progress Note  Patient Details  Name: Beth [name] MRN: [id number] Date of Birth: 11-28-1985  Beginning of progress report period: June 12, 2020 End of progress report period: July 09, 2020  Today's Date: 07/09/2020 OT Individual Time: 1000-1100 OT Individual Time Calculation (min): 60 min   Patient has made great progress towards OT goals this week. Patient has been much more agreeable, appropriate and pleasant during therapy sessions. She needs as little as min A for transfers and BADL tasks, but still needs occasional mod A. L UE function continues to improve with shoulder/elbow/wrist/hand activation noted. Plan to keep patient longer as she is making great progress. Continue current POC.  Patient continues to demonstrate the following deficits: muscle weakness, impaired timing and sequencing, abnormal tone, unbalanced muscle activation, motor apraxia, ataxia, decreased coordination and decreased motor planning, decreased midline orientation and decreased attention to left, decreased initiation, decreased attention, decreased awareness, decreased problem solving, decreased safety awareness, decreased memory and delayed processing and decreased sitting balance, decreased standing balance, decreased postural control, hemiplegia and decreased balance strategies and therefore will continue to benefit from skilled OT intervention to enhance overall performance with BADL and Reduce care partner burden.  Patient progressing toward long term goals..  Continue plan of care.  OT Short Term Goals Week 4:  OT Short Term Goal 1 (Week 4): LTG=STG 2/2ELOS Week 5:  OT Short Term Goal 1 (Week 5): Patient will complete LB dressing with min A OT Short Term Goal 2 (Week 5): Pt will use L UE as a stabilizer with min questioning cues OT Short Term Goal 3 (Week 5): Pt will complete toilet transfer with min A  Skilled Therapeutic Interventions/Progress Updates:    Patient  greeted semi-reclined in bed and agreeable to OT treatment session. Pt pleasant and cooperative throughout session. Bed mobility with supervision, then min A stand-pivot to wc, and min A stand-pivot to commode. Pt needed OT assist for clothing management, then was able to void and complete her own peri-care. Pt then ambulated 5 feet to shower bench using Grab bar and min HHA from OT. Bathing completed with min A. [employer] L NMR during bathing with pushing and pulling wash cloth. Family present for family education after shower. Educated on hemi dressing techniques and letting pt do as much as she can. Pt still needs cues to dress L side of her body, but has much better awareness overall of L side. Educated family on use of L hand as a stabilizer when applying toothpaste. Worked on standing balance and endurance with standing at the sink with mirror for visual feedback for 3 minutes. L UE NMR with pt able to actively flex L elbow, but needed OT assist for extension. Pt left seated in wc at end of session with mother braiding hair and safety belt on.   Therapy Documentation Precautions:  Precautions Precautions: Fall,Other (comment) Precaution Comments: Lt radial head fx; PIP fx Rt little finger, and MCP fx Rt ring finger; unrestricted ROM LUE per Dr Doreatha Martin note 10/29 Required Braces or Orthoses: Splint/Cast Splint/Cast: ulnar gutter splint (RUE); Lt elbow bean bag splint for elbow extension;  Lt UE NWB; peg Restrictions Weight Bearing Restrictions: Yes RUE Weight Bearing: Weight bearing as tolerated LUE Weight Bearing: Touch down weight bearing Pain:   denies pain  Therapy/Group: Individual Therapy  Valma Cava 07/09/2020, 11:00 AM

## 2020-07-10 ENCOUNTER — Inpatient Hospital Stay (HOSPITAL_COMMUNITY): Payer: Medicaid Other

## 2020-07-10 ENCOUNTER — Inpatient Hospital Stay (HOSPITAL_COMMUNITY): Payer: Medicaid Other | Admitting: Occupational Therapy

## 2020-07-10 MED ORDER — MEGESTROL ACETATE 400 MG/10ML PO SUSP
400.0000 mg | Freq: Every day | ORAL | Status: DC
  Administered 2020-07-11 – 2020-07-13 (×3): 400 mg via ORAL
  Filled 2020-07-10 (×3): qty 10

## 2020-07-10 NOTE — Progress Notes (Signed)
Pt slept from 2200pm of 12/16 to 0630am of 12/17. Pt was awake when using the bathroom and vitals around 0430am. Pt in no acute distress. RN will continue to monitor.
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
## 2020-07-10 NOTE — Progress Notes (Signed)
Patient ID: Beth [name], female   DOB: 09-01-1985, 34 y.o.   MRN: [id number]  SW received phone call from Beth [name]/[name] requesting SW refax referral as referral was too dark. SW faxed again.   Updates:  1) [facility] location (p:[phone]). Referral faxed *SW received message from Beth [name] [phone]) who reported no medicaid beds for residential services, and would need to consider auto insurance policy. Can be placed on waitlist, and there are currently 10-12 people on list at this time.   2) [facility] [facility] ([phone]) *SW received message from Beth [name] [phone]) who stated no residential beds and not accepting new admissions. Stated even for outpatient, pt would not be eligible as they do not accept Medicaid.   3)[facility] ([phone]/[phone]).  *SW received message from [facility] [name] [phone]) who stated after reviewing clinicals, pt  would not be considered for another acute inpatient stay, and also not contracted with [facility].   4) [facility] (p:[phone]/f:[phone]) *SW spoke with Beth [name]/Admissions [phone]) about referral and if received. States she will come by and see patient on Tuesday at 1pm.  SW met with pt and pt fiance in room to discuss above about [facility]. States he would like for the rep to visit her. States he is hopeful they have an outpatient program as he is looking to have her at home, and preparing 24/7 care for her with private aides so she can be at home with him and the children.   Beth [name], MSW, [place] Office: [phone] Cell: [phone] Fax: [phone]

## 2020-07-10 NOTE — Progress Notes (Signed)
Speech Language Pathology Daily Session Note  Patient Details  Name: Beth [name] MRN: [id number] Date of Birth: 23-Oct-1985  Today's Date: 07/10/2020 SLP Individual Time: 1501-1600 SLP Individual Time Calculation (min): 59 min  Short Term Goals: Week 5: SLP Short Term Goal 1 (Week 5): Patient will verbalize orientation to place, time (month/year) and situation with Min verbal and visual cues. SLP Short Term Goal 2 (Week 5): Patient will demonstrate sustained attention to a functional task for 20 minutes with Mod verbal cues for redirection. SLP Short Term Goal 3 (Week 5): Patient will demonstrate functional problem solving for basic and familiar tasks with Min verbal cues. SLP Short Term Goal 4 (Week 5): Patient will consume current diet with minimal overt s/s of aspiration and Supervision verbal cues for use of swallowing compensatory strategies. SLP Short Term Goal 5 (Week 5): Patient will demonstrate efficient mastication and complete oral clearance with trials of regular textures over 2 sessions with Min verbal cues prior to upgrade.  Skilled Therapeutic Interventions: Skilled treatment session focused on cognitive goals. SLP facilitated session by providing ongoing education to patient and husband regarding patient's current cognitive function and plan to d/c back to [place]. Pt demonstrates emerging awareness of cognitive impairment and need for adaptive assistance for daily living tasks. Pt able to recall 3/8 medications independently, min cues increased to 5/8. SLP facilitating session by providing mod A verbal cues for sustained attention for orientation task. Ongoing education provided re: Social worker.  Pt left with NT in room to assist to the bathroom. Cont ST POC.   Pain Pain Assessment Pain Scale: 0-10 Pain Score: 0-No pain  Therapy/Group: Individual Therapy  Beth [name] 07/10/2020, 4:00 PM

## 2020-07-10 NOTE — Progress Notes (Signed)
Occupational Therapy Session Note  Patient Details  Name: Beth [name] MRN: [id number] Date of Birth: [birth year]  Today's Date: 07/10/2020 OT Individual Time: 1000-1100 OT Individual Time Calculation (min): 60 min   Short Term Goals: Week 5:  OT Short Term Goal 1 (Week 5): Patient will complete LB dressing with min A OT Short Term Goal 2 (Week 5): Pt will use L UE as a stabilizer with min questioning cues OT Short Term Goal 3 (Week 5): Pt will complete toilet transfer with min A  Skilled Therapeutic Interventions/Progress Updates:    Pt greeted semi-reclined in enclosure bed and agreeable to OT treatment session. PT reported she needed to go to the bathroom. Pt ambulate to bathroom with min HHA x2. Min A for clothing management, then pt had continent void of bladder. Set-up A for peri-care. Pt agreeable to shower and ambulated 5 feet to tub bench in shower. Bathing completed with focus on L UE NMR to push and pull wash cloth across R arm. Overall min A for bathing tasks today. Dressing completed from wc at the sink with OT assist to thread L side of body, but pt able to complete the rest. Sit<>stands with min A. Also incorporated forced use of L UE to try to help pull up pants. Functional ambulation with min/mod +2 to the dayroom. Continued L UE NMR with arm slides across table and active elbow flex/ext. Pt returned to room and phone was ringing. Pt answered the phone, then completed stand-pivot back to bed while  Holding the phone, demonstrating improved ability to dual task. Pt left in enclosure bed with needs met.  Therapy Documentation Precautions:  Precautions Precautions: Fall,Other (comment) Precaution Comments: Lt radial head fx; PIP fx Rt little finger, and MCP fx Rt ring finger; unrestricted ROM LUE per Dr Doreatha Martin note 10/29 Required Braces or Orthoses: Splint/Cast Splint/Cast: ulnar gutter splint (RUE); Lt elbow bean bag splint for elbow extension;  Lt UE NWB;  peg Restrictions Weight Bearing Restrictions: Yes RUE Weight Bearing: Weight bearing as tolerated LUE Weight Bearing: Weight bearing as tolerated RLE Weight Bearing: Weight bearing as tolerated LLE Weight Bearing: Touchdown weight bearing Pain:  denies pain Therapy/Group: Individual Therapy  Valma Cava 07/10/2020, 11:02 AM

## 2020-07-10 NOTE — Progress Notes (Signed)
Physical Therapy Session Note  Patient Details  Name: Beth [name] MRN: [id number] Date of Birth: Mar 10, 1986  Today's Date: 07/10/2020 PT Individual Time: 3810-1751 PT Individual Time Calculation (min): 54 min   Short Term Goals: Week 4:  PT Short Term Goal 1 (Week 4): Pt will ambulate 100' consistently with modA. PT Short Term Goal 2 (Week 4): Pt will complete x4 steps with modA +1. PT Short Term Goal 3 (Week 4): Pt will perform bed to chair transfer consistently with minA.  Skilled Therapeutic Interventions/Progress Updates:     Pt received seated at EOB finishing lunch. No complaint of pain and agreeable to therapy. Stand pivot transfer to [facility] with minA. WC transport to gym for time management. Focus of session on gait training and attending to L leg during ambulation. Pt cues to ambulate to various targets, attempting to maintain balance without use of AD or PT providing tactile cuing or manual assistance. Pt occasionally performs ambulation with CGA up to 20' but majority of ambulation bouts require minA due to LOBs to the L with feet crossing over during gait cycle. PT then places 3 lb weight on pt's R leg to encourage increased lateral weight shift to the L with pt demonstrating good results and improved weight shifting. Pt ambulates x2 bouts of 50' with weight on R leg and then x2 bouts with 3lb weight on L leg to provide NM feedback and to improve attention to L leg. Pt left seated in WC with seat belt in place and all needs within reach.  Therapy Documentation Precautions:  Precautions Precautions: Fall,Other (comment) Precaution Comments: Lt radial head fx; PIP fx Rt little finger, and MCP fx Rt ring finger; unrestricted ROM LUE per Dr Doreatha Martin note 10/29 Required Braces or Orthoses: Splint/Cast Splint/Cast: ulnar gutter splint (RUE); Lt elbow bean bag splint for elbow extension;  Lt UE NWB; peg Restrictions Weight Bearing Restrictions: Yes RUE Weight Bearing: Weight bearing as  tolerated LUE Weight Bearing: Weight bearing as tolerated RLE Weight Bearing: Weight bearing as tolerated LLE Weight Bearing: Touchdown weight bearing   Therapy/Group: Individual Therapy  Breck Coons, PT, DPT 07/10/2020, 4:13 PM

## 2020-07-11 ENCOUNTER — Inpatient Hospital Stay (HOSPITAL_COMMUNITY): Payer: Medicaid Other | Admitting: Occupational Therapy

## 2020-07-11 ENCOUNTER — Inpatient Hospital Stay (HOSPITAL_COMMUNITY): Payer: Medicaid Other

## 2020-07-11 NOTE — Progress Notes (Signed)
Occupational Therapy Session Note  Patient Details  Name: Beth [name] MRN: [id number] Date of Birth: [birth year]  Today's Date: 07/11/2020 Session 1 OT Individual Time: 6314-9702 OT Individual Time Calculation (min): 45 min   Session 2 OT Individual Time: 6378-5885 OT Individual Time Calculation (min): 44 min    Short Term Goals: Week 5:  OT Short Term Goal 1 (Week 5): Patient will complete LB dressing with min A OT Short Term Goal 2 (Week 5): Pt will use L UE as a stabilizer with min questioning cues OT Short Term Goal 3 (Week 5): Pt will complete toilet transfer with min A  Skilled Therapeutic Interventions/Progress Updates:    Session 1 Pt greeted semi-reclined in enclosure bed talking with nursing, handoff to OT. Pt perseverating on being sad, wanting to go home, and missing her kids. OT used therapeutic use of self to encourage patient and redirect her topic of conversation. Patient completed bed mobility with supervision and ambulated to bathroom min HHA. Pt needed min A to doff pants and sit on commode. Continent void of bladder and pt completed peri-care. Pt ambulated to shower bench with bathing completed with min A. Focus on functional use of L hand to push and pull wash cloth across body. Dressing completed from wc at the sink with pt able to thread pant legs. Worked on standing balance and forced use of L UE to pull up pants in standing. Verbal cues and hand over hand A to recall hemi UB dressing techniques. Pt returned to bed at end of session with CGA stand-pivot.   Session 2 Pt greeted semi-reclined in bed awake and agreeable to OT treatment session. Pt less perseverative on going home, but still talked about being sad and that she was ready to leave. She was easier to redirect and paid more attention to task at hand this afternoon. Pt completed stand-pivot to wc with min A. Pt then ambulated 10 feet to therapy mat with min A and verbal cues to bring LLE all the way through  when stepping. Pt tolerated sitting unsupported on therapy mat while eating lunch today. Worked on using L hand as a stabilizer to hold drink cup while eating. Worked on functional use of L UE with cup stacking activity. OT assist to extend finger, then pt able to maintain grasp and lift cup up to stack with only guided A from OT. Pt returned to room and ambulated to the bathroom with min/mod HHA. Pt voided bladder, completed peri-care, and needed assist to pull up pants. Pt then ambulated back to bed in similar fashion and was left semi-reclined in bed with needs met.   Therapy Documentation Precautions:  Precautions Precautions: Fall,Other (comment) Precaution Comments: Lt radial head fx; PIP fx Rt little finger, and MCP fx Rt ring finger; unrestricted ROM LUE per Dr Doreatha Martin note 10/29 Required Braces or Orthoses: Splint/Cast Splint/Cast: ulnar gutter splint (RUE); Lt elbow bean bag splint for elbow extension;  Lt UE NWB; peg Restrictions Weight Bearing Restrictions: Yes RUE Weight Bearing: Weight bearing as tolerated LUE Weight Bearing: Weight bearing as tolerated RLE Weight Bearing: Weight bearing as tolerated LLE Weight Bearing: Touchdown weight bearing Pain:   denies pain  Therapy/Group: Individual Therapy  Valma Cava 07/11/2020, 12:54 PM
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
## 2020-07-11 NOTE — Progress Notes (Signed)
Pt continues to require use of enclosure bed due to impulsivity. Pt intermittently attempts to get up and out of bed without assist. Pt is unaware of safety precautions and needs to be reminded frequently.

## 2020-07-11 NOTE — Progress Notes (Signed)
Physical Therapy Session Note  Patient Details  Name: Beth [name] MRN: [id number] Date of Birth: 1986/06/19  Today's Date: 07/11/2020 PT Individual Time: 1000-1100 PT Individual Time Calculation (min): 60 min   Short Term Goals: Week 1:  PT Short Term Goal 1 (Week 1): Patient will participate in meangingful therapy for >5 mins PT Short Term Goal 1 - Progress (Week 1): Met PT Short Term Goal 2 (Week 1): Patient will roll R/L with CGA consistently PT Short Term Goal 2 - Progress (Week 1): Progressing toward goal PT Short Term Goal 3 (Week 1): Patient will transition supine <> sit with MaxA x1 PT Short Term Goal 3 - Progress (Week 1): Met PT Short Term Goal 4 (Week 1): Patient will transfer bed<> wc MaxA x1 with LRAD PT Short Term Goal 4 - Progress (Week 1): Partly met (Inconsitent max A +1 to mod A +2) Week 2:  PT Short Term Goal 1 (Week 2): Patient will perform bed mobility with mod A consistently. PT Short Term Goal 1 - Progress (Week 2): Met PT Short Term Goal 2 (Week 2): Patient will participate in functional activities >10 min intervals during therapy sessions. PT Short Term Goal 2 - Progress (Week 2): Progressing toward goal PT Short Term Goal 3 (Week 2): Patient will perform transfers bed<>w/c with mod A consistently. PT Short Term Goal 3 - Progress (Week 2): Progressing toward goal PT Short Term Goal 4 (Week 2): Patient will ambulate >50 ft with max +2 assist. PT Short Term Goal 4 - Progress (Week 2): Met Week 3:  PT Short Term Goal 1 (Week 3): Patient will participate in functional activities >10 min intervals during therapy sessions. PT Short Term Goal 1 - Progress (Week 3): Met PT Short Term Goal 2 (Week 3): Patient will perform transfers bed<>w/c with mod A consistently. PT Short Term Goal 2 - Progress (Week 3): Met PT Short Term Goal 3 (Week 3): Pt will ambulate 100' with modA +1. PT Short Term Goal 3 - Progress (Week 3): Progressing toward goal PT Short Term Goal 4  (Week 3): Pt will complete x4 steps with modA +1. PT Short Term Goal 4 - Progress (Week 3): Progressing toward goal Week 4:  PT Short Term Goal 1 (Week 4): Pt will ambulate 100' consistently with modA. PT Short Term Goal 2 (Week 4): Pt will complete x4 steps with modA +1. PT Short Term Goal 3 (Week 4): Pt will perform bed to chair transfer consistently with minA.  Skilled Therapeutic Interventions/Progress Updates:    PAIN denies pain this am  Pt seen this am for session w/focus on core stability, gait training, cognition,  Balance. Pt tangential w/thoughts, limited attention span, limited attention to task, L inattention.  Pt initially supine in poseybed.  Supine to sit w/supervision.  stand pivot transfer to wc w/min assist and cues to attendt to LLE positioning and safety.  Moves somewhat impulsiveley. Pt transported to gym.  Sit to stand in parallel bars w/min assist, cues for positioning of LLE prior to transition.  Parallel bars standing sidestepping using mirror for feedback, cues to attend to LLE and to task. W/mirror for feedback worked on maintaining L knee extennsion while tapping L foot LLE Hamstring curls x 12 L Hip abduction x 10 L hip flexion x 15  stand pivot transfer wc to mat to R side w/min assist for safety, cues for safety/impulsivity. Sit to supine w/verbal cues to attend to L/overall position.  There w/emphasis on core stabilization/atention  to L/priorioceptive training L:  Supine bridge w/cues for midline Supine LLE clamshells to promote increased attention to L Supine ball squeeze bilat LEs w/cues for midline Attempted bilat but pt unable to attend adequately to LLE, then switched to supine w/L foot on medball rolling ball L and R  Bridge w/feet on medball, max cues to attend to bilat les   Gait  42f x 2 w/standing rest break, mod assist of 1 due to crossing over which she can control when attending to task, L hip instability w/moderate trendelenberg,  decreased ankle df and knee flexion pre thru midswing, all LLE.  Pt transported back to room at end of session.  stand pivot transfer wc to bed to R side w/min to cga and cues for safety.  Sit to supine w/cues to attend to L exts.  Pt positioned for comfort and Posey bed fastened for safety.       Therapy Documentation Precautions:  Precautions Precautions: Fall,Other (comment) Precaution Comments: Lt radial head fx; PIP fx Rt little finger, and MCP fx Rt ring finger; unrestricted ROM LUE per Dr HDoreatha Martinnote 10/29 Required Braces or Orthoses: Splint/Cast Splint/Cast: ulnar gutter splint (RUE); Lt elbow bean bag splint for elbow extension;  Lt UE NWB; peg Restrictions Weight Bearing Restrictions: Yes RUE Weight Bearing: Weight bearing as tolerated LUE Weight Bearing: Weight bearing as tolerated RLE Weight Bearing: Weight bearing as tolerated LLE Weight Bearing: Touchdown weight bearing    Therapy/Group: Individual Therapy  [place]allie Fielding PM[place]12/18/2021, 12:45 PM
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
## 2020-07-13 ENCOUNTER — Inpatient Hospital Stay (HOSPITAL_COMMUNITY): Payer: Medicaid Other | Admitting: Speech Pathology

## 2020-07-13 ENCOUNTER — Inpatient Hospital Stay (HOSPITAL_COMMUNITY): Payer: Medicaid Other

## 2020-07-13 ENCOUNTER — Inpatient Hospital Stay (HOSPITAL_COMMUNITY): Payer: Medicaid Other | Admitting: Occupational Therapy

## 2020-07-13 LAB — BASIC METABOLIC PANEL
Anion gap: 10 (ref 5–15)
BUN: 10 mg/dL (ref 6–20)
CO2: 23 mmol/L (ref 22–32)
Calcium: 9.6 mg/dL (ref 8.9–10.3)
Chloride: 106 mmol/L (ref 98–111)
Creatinine, Ser: 0.45 mg/dL (ref 0.44–1.00)
GFR, Estimated: >60 mL/min (ref >60)
Glucose, Bld: 90 mg/dL (ref 70–99)
Potassium: 4 mmol/L (ref 3.5–5.1)
Sodium: 139 mmol/L (ref 135–145)

## 2020-07-13 LAB — CBC
HCT: 39.6 % (ref 36.0–46.0)
Hemoglobin: 12.7 g/dL (ref 12.0–15.0)
MCH: 29.3 pg (ref 26.0–34.0)
MCHC: 32.1 g/dL (ref 30.0–36.0)
MCV: 91.2 fL (ref 80.0–100.0)
Platelets: 341 10*3/uL (ref 150–400)
RBC: 4.34 MIL/uL (ref 3.87–5.11)
RDW: 13.5 % (ref 11.5–15.5)
WBC: 7.6 10*3/uL (ref 4.0–10.5)
nRBC: 0 % (ref 0.0–0.2)

## 2020-07-13 MED ORDER — PROPRANOLOL HCL 20 MG PO TABS
20.0000 mg | ORAL_TABLET | Freq: Three times a day (TID) | ORAL | Status: AC
  Administered 2020-07-13 – 2020-07-14 (×5): 20 mg via ORAL
  Filled 2020-07-13 (×5): qty 1

## 2020-07-13 MED ORDER — VITAMIN D 25 MCG (1000 UNIT) PO TABS
1000.0000 [IU] | ORAL_TABLET | Freq: Every day | ORAL | Status: DC
  Administered 2020-07-14 – 2020-07-22 (×9): 1000 [IU] via ORAL
  Filled 2020-07-13 (×9): qty 1

## 2020-07-13 MED ORDER — ACETAMINOPHEN 325 MG PO TABS
325.0000 mg | ORAL_TABLET | ORAL | Status: DC | PRN
  Administered 2020-07-14 – 2020-07-20 (×3): 650 mg via ORAL
  Filled 2020-07-13 (×4): qty 2

## 2020-07-13 MED ORDER — APIXABAN 5 MG PO TABS
5.0000 mg | ORAL_TABLET | Freq: Two times a day (BID) | ORAL | Status: DC
  Administered 2020-07-13 – 2020-07-22 (×18): 5 mg via ORAL
  Filled 2020-07-13 (×19): qty 1

## 2020-07-13 MED ORDER — BLISTEX MEDICATED EX OINT
1.0000 "application " | TOPICAL_OINTMENT | CUTANEOUS | Status: DC | PRN
  Filled 2020-07-13 (×2): qty 6.3

## 2020-07-13 NOTE — Progress Notes (Signed)
Pt continues to tolerate transition out of enclosure bed well. Pt tolerating low bed with no complications. Call light in reach. Floor mats in place. Needs addressed. Sheela Stack, LPN

## 2020-07-13 NOTE — Progress Notes (Signed)
Enclosure bed removed from pt room and replaced with low bed. Pt toileted and needs addressed. Pt tolerating well no complications noted. Floor mats in place on both sides of bed. Call light in reach.   Sheela Stack, LPN

## 2020-07-13 NOTE — Progress Notes (Signed)
Speech Language Pathology Daily Session Note  Patient Details  Name: Beth [name] MRN: [id number] Date of Birth: [birth year]  Today's Date: 07/13/2020 SLP Individual Time: 1300-1355 SLP Individual Time Calculation (min): 55 min  Short Term Goals: Week 5: SLP Short Term Goal 1 (Week 5): Patient will verbalize orientation to place, time (month/year) and situation with Min verbal and visual cues. SLP Short Term Goal 2 (Week 5): Patient will demonstrate sustained attention to a functional task for 20 minutes with Mod verbal cues for redirection. SLP Short Term Goal 3 (Week 5): Patient will demonstrate functional problem solving for basic and familiar tasks with Min verbal cues. SLP Short Term Goal 4 (Week 5): Patient will consume current diet with minimal overt s/s of aspiration and Supervision verbal cues for use of swallowing compensatory strategies. SLP Short Term Goal 5 (Week 5): Patient will demonstrate efficient mastication and complete oral clearance with trials of regular textures over 2 sessions with Min verbal cues prior to upgrade.  Skilled Therapeutic Interventions: Skilled treatment session focused on dysphagia and cognitive goals. SLP facilitated session by providing skilled observation with lunch meal of Dys.3 textures with thin liquids. Patient consumed meal without overt s/s of aspiration but required Min verbal cues to decrease verbosity and decrease impulsivity. Recommend patient continue current diet. SLP also facilitated session by providing a novel card task. Patient recalled the procedures to the task with Min verbal cues and required Mod verbal cues for problem solving. Max verbal cues were needed for patient to record the rules of the task to maximize recall. Patient transferred back to enclosure bed at end of session. Continue with current plan of care.      Pain No/Denies Pain   Therapy/Group: Individual Therapy  Rubena Roseman, [place] 07/13/2020, 3:21 PM
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
## 2020-07-13 NOTE — Progress Notes (Signed)
Physical Therapy Session Note  Patient Details  Name: Beth [name] MRN: [id number] Date of Birth: 23-Oct-1985  Today's Date: 07/13/2020 PT Individual Time: 5974-1638 PT Individual Time Calculation (min): 70 min   Short Term Goals: Week 4:  PT Short Term Goal 1 (Week 4): Pt will ambulate 100' consistently with modA. PT Short Term Goal 2 (Week 4): Pt will complete x4 steps with modA +1. PT Short Term Goal 3 (Week 4): Pt will perform bed to chair transfer consistently with minA.  Skilled Therapeutic Interventions/Progress Updates:     Pt received seated in WC at RN station and agrees to therapy. Reports no pain. WC transport to gym for time management. Pt performs NMR in parallel bars, working on side stepping with mirror for visual feedback. PT provides multimodal cuing for posture, neutral hip positioning, and assistance with lateral weight shifting. PT also manually assists legs to perform larger step for increased weight shifting and hip abductor activation. Pt takes seated rest breaks between bouts. Pt stands facing mirror and performs R leg lifts, engaging hip flexors on R and hip abductors on L, with PT providing manual facilitation of L knee extension and neutral positioning of pelvis and L leg. 3x10 with seated rest breaks. Pt performs stand pivot transfer to mat table with modA. Sit to supine with minA. Pt perofrms supine bridges with bilateral lower extremities and PT using rolled sheet under hips to facilitate. 3x10. Pt then performs only L sided bridging to focus on L hemibody. 2x10. Supine to sit with supervision and increased time to complete. Pt ambulates x300' with modA +1 and no AD, with pt ambulating majority of distance with minA but requiring modA during turns due to difficulty sequencing steps, primarily with L leg. Pt left seated in WC at RN station with alarm intact.   Therapy Documentation Precautions:  Precautions Precautions: Fall,Other (comment) Precaution Comments:  Lt radial head fx; PIP fx Rt little finger, and MCP fx Rt ring finger; unrestricted ROM LUE per Dr Doreatha Martin note 10/29 Required Braces or Orthoses: Splint/Cast Splint/Cast: ulnar gutter splint (RUE); Lt elbow bean bag splint for elbow extension;  Lt UE NWB; peg Restrictions Weight Bearing Restrictions: Yes RUE Weight Bearing: Weight bearing as tolerated LUE Weight Bearing: Weight bearing as tolerated RLE Weight Bearing: Weight bearing as tolerated LLE Weight Bearing: Touchdown weight bearing    Therapy/Group: Individual Therapy  Breck Coons, PT, DPT 07/13/2020, 12:27 PM

## 2020-07-13 NOTE — Progress Notes (Signed)
Occupational Therapy Session Note  Patient Details  Name: Beth [name] MRN: [id number] Date of Birth: February 24, 1986  Today's Date: 07/13/2020 OT Individual Time: 1856-3149 OT Individual Time Calculation (min): 58 min    Short Term Goals: Week 5:  OT Short Term Goal 1 (Week 5): Patient will complete LB dressing with min A OT Short Term Goal 2 (Week 5): Pt will use L UE as a stabilizer with min questioning cues OT Short Term Goal 3 (Week 5): Pt will complete toilet transfer with min A  Skilled Therapeutic Interventions/Progress Updates:    Pt greeted semi-reclined in bed and agreeable to OT treatment session. Pt had a great therapy session and was appropriate with less perseveration's. Pt ambulated to bathroom w/ hand held min A. She was able to manage clothing min A. Voided bladder and completed peri-care with set-up. Bathing from shower bench with min A and hand over hand to integrate L UE for neuro re-ed. Dressing from straight back chair with pt able to thread pants legs and don socks, only min A to pull up pants. Reviewed hemi techniques for UB dressing, but pt had difficulty threading L UE first. Toothbrushing task at the sink with set-up A and L UE as a stabilizer. OT exchanged TIS wc for regular wc and pt left seated with chair alarm on at the nurses station.   Therapy Documentation Precautions:  Precautions Precautions: Fall,Other (comment) Precaution Comments: Lt radial head fx; PIP fx Rt little finger, and MCP fx Rt ring finger; unrestricted ROM LUE per Dr Doreatha Martin note 10/29 Required Braces or Orthoses: Splint/Cast Splint/Cast: ulnar gutter splint (RUE); Lt elbow bean bag splint for elbow extension;  Lt UE NWB; peg Restrictions Weight Bearing Restrictions: Yes RUE Weight Bearing: Weight bearing as tolerated LUE Weight Bearing: Weight bearing as tolerated RLE Weight Bearing: Weight bearing as tolerated LLE Weight Bearing: Touchdown weight bearing Pain: Pain Assessment Pain  Scale: 0-10 Pain Score: 0-No pain   Therapy/Group: Individual Therapy  Valma Cava 07/13/2020, 9:49 AM

## 2020-07-14 ENCOUNTER — Inpatient Hospital Stay (HOSPITAL_COMMUNITY): Payer: Medicaid Other | Admitting: Occupational Therapy

## 2020-07-14 ENCOUNTER — Inpatient Hospital Stay (HOSPITAL_COMMUNITY): Payer: Medicaid Other

## 2020-07-14 ENCOUNTER — Inpatient Hospital Stay (HOSPITAL_COMMUNITY): Payer: Medicaid Other | Admitting: Speech Pathology

## 2020-07-14 MED ORDER — QUETIAPINE FUMARATE 50 MG PO TABS
75.0000 mg | ORAL_TABLET | Freq: Every day | ORAL | Status: DC
  Administered 2020-07-14: 75 mg via ORAL
  Filled 2020-07-14: qty 1

## 2020-07-14 MED ORDER — PROPRANOLOL HCL ER 60 MG PO CP24
60.0000 mg | ORAL_CAPSULE | Freq: Every day | ORAL | Status: DC
  Administered 2020-07-15 – 2020-07-16 (×2): 60 mg via ORAL
  Filled 2020-07-14 (×2): qty 1

## 2020-07-14 NOTE — Patient Care Conference (Signed)
Inpatient RehabilitationTeam Conference and Plan of Care Update Date: 07/14/2020   Time: 10:49 AM    Patient Name: Beth [name]      Medical Record Number: [id number]  Date of Birth: [birth year] Sex: Female         Room/Bed: 4W14C/4W14C-01 Payor Info: Payor: [place] / Plan: [employer]BS MEDICAID ADVANTAGE / Product Type: *No Product type* /    Admit Date/Time:  06/11/2020  8:36 PM  Primary Diagnosis:  TBI (traumatic brain injury) [facility])  Hospital Problems: Principal Problem:   TBI (traumatic brain injury) ([place]) Active Problems:   Sinus tachycardia   Psychogenic fecal incontinence   S/P percutaneous endoscopic gastrostomy (PEG) tube placement ([place])   Sleep disturbance   Acute blood loss anemia   Hyperglycemia   Oral ulcer   PEG tube malfunction ([place])   Oral herpes   Labile blood glucose    Expected Discharge Date: Expected Discharge Date: 07/22/20  Team Members Present: Physician leading conference: Dr. Alger Simons Care Coodinator Present: Loralee Pacas, LCSWA;Shereena Berquist Creig Hines, RN, BSN, CRRN Nurse Present: Genene Churn, RN PT Present: Tereasa Coop, PT OT Present: Cherylynn Ridges, OT SLP Present: Weston Anna, SLP PPS Coordinator present : Gunnar Fusi, SLP     Current Status/Progress Goal Weekly Team Focus  Bowel/Bladder   Pt continent of B/B with occasional urinary incontinence LBM 07/12/2020  Continence  Assess B/B every shift and PRN   Swallow/Nutrition/ Hydration   Dys. 3 textures with thin liquids, supervision  Min A  trials of regular textures   ADL's   Min A overall  min/CGA overall  cognitive retraining, L NMR, pt/family education, dc planning   Mobility   supervision bed mobility, minA sit to stand, min/modA bed to chair transfer, gait ~300' with modA and no AD. Loses balance with turns consistently.  MinA  LLE NMR, balance, ambulation, safety awareness   Communication   Supervision  Min A  Self-monitor and correct verbosity    Safety/Cognition/ Behavioral Observations  [place] Level VII-Min A  Min-Mod A  sustained attention, functional problem solving, safety awareness   Pain   currently denies pain  pain scale 0/10  Assess pain every shift and PRN   Skin   intact  no new skin breakdown  Assess skin every shift and PRN     Discharge Planning:  Pt insurance will not approve any SNF locations in [place] . Family plan for pt to d/c to [place] with pt fiance. He intends to have private aides to help provide 24/7 care for pt. Referral for [facility] outpatient referral sent and family waiting on appointment. Pt to be screened for [facility] on 12/21 at 1pm to see if appropriate for program. Family edu completed.   Team Discussion: Continent B/B, flushing tube. OT reports patient is a min assist walking with hand held assist. PT reports patient is a min assist. SLP reports patient is on a Dys 3, thin. Talks in long string of words without stopping to take a breath. She is out of the enclosure bed and into the lowbed. She is doing well with this transition. Patient on target to meet rehab goals: yes  *See Care Plan and progress notes for long and short-term goals.   Revisions to Treatment Plan:  Family is deciding if they are going to drive or fly her back to [place]. Recommendation is to drive her back and stop have way for a break.  Teaching Needs: Continue family education. Education on flushing the tube.  Pain management  Current Barriers to Discharge: Inaccessible home environment, Decreased caregiver support, Home enviroment access/layout, Wound care, Lack of/limited family support, Weight bearing restrictions, Behavior and pain.  Possible Resolutions to Barriers: Continue current medications, wound care management, pain management, weight bearing precautions, provide emotional support to patient and family.     Medical Summary Current Status: Improving cognition, behavior. now in lo-bed. improving  memory and function. RLAS VI  Barriers to Discharge: Behavior;Medical stability   Possible Resolutions to [employer]: ongoing med mgt to maximize concentration/behavior. nutritional assessment, family ed   Continued Need for Acute Rehabilitation Level of Care: The patient requires daily medical management by a physician with specialized training in physical medicine and rehabilitation for the following reasons: Direction of a multidisciplinary physical rehabilitation program to maximize functional independence : Yes Medical management of patient stability for increased activity during participation in an intensive rehabilitation regime.: Yes Analysis of laboratory values and/or radiology reports with any subsequent need for medication adjustment and/or medical intervention. : Yes   I attest that I was present, lead the team conference, and concur with the assessment and plan of the team.   Cristi Loron 07/14/2020, 5:17 PM
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
## 2020-07-14 NOTE — Progress Notes (Signed)
Pt slept through the night between 2200-0530 only awakening once to use the restroom.

## 2020-07-14 NOTE — Progress Notes (Signed)
Occupational Therapy Session Note  Patient Details  Name: Beth [name] MRN: [id number] Date of Birth: 1986/01/30  Today's Date: 07/14/2020 OT Individual Time: 1200-1300 OT Individual Time Calculation (min): 60 min   Short Term Goals: Week 5:  OT Short Term Goal 1 (Week 5): Patient will complete LB dressing with min A OT Short Term Goal 2 (Week 5): Pt will use L UE as a stabilizer with min questioning cues OT Short Term Goal 3 (Week 5): Pt will complete toilet transfer with min A  Skilled Therapeutic Interventions/Progress Updates:    Patient greeted sitting in wc handoff from SLP. Pt perseverative all morning on feeling dirty and needing to bathe. Pt ambulated into bathroom w/ min HHA. She sat on bench to doff clothes with min A and min verbal cues for safety. L UE NMR in shower with hand over hand A to push and pull wash cloth across body. Dressing tasks completed at the sink with pt able to thread BLEs into pants and min A to pull them up. Min A for UB dressing to thread L UE through shirt. Toothbrushing task at the sink with verbal cues to use L hand as a stabilizer. Pt set-up for lunch and was able to consume entire lunch with only min verbal cues for rate of bites. Worked on functional use of L UE with foam squeezes, then pt completed stand-pivot back to bed with CGA. Pt left semi-reclined in bed with bed alarm on, call bell in reach, and needs met.   Therapy Documentation Precautions:  Precautions Precautions: Fall,Other (comment) Precaution Comments: Lt radial head fx; PIP fx Rt little finger, and MCP fx Rt ring finger; unrestricted ROM LUE per Dr Doreatha Martin note 10/29 Required Braces or Orthoses: Splint/Cast Splint/Cast: ulnar gutter splint (RUE); Lt elbow bean bag splint for elbow extension;  Lt UE NWB; peg Restrictions Weight Bearing Restrictions: No RUE Weight Bearing: Weight bearing as tolerated LUE Weight Bearing: Weight bearing as tolerated RLE Weight Bearing: Weight bearing  as tolerated LLE Weight Bearing: Touchdown weight bearing Pain:  denies pain   Therapy/Group: Individual Therapy  Valma Cava 07/14/2020, 12:37 PM

## 2020-07-14 NOTE — Progress Notes (Signed)
Physical Therapy Session Note  Patient Details  Name: Beth [name] MRN: [id number] Date of Birth: [birth year]  Today's Date: 07/14/2020 PT Individual Time: 0916-1028 PT Individual Time Calculation (min): 72 min   Short Term Goals: Week 4:  PT Short Term Goal 1 (Week 4): Pt will ambulate 100' consistently with modA. PT Short Term Goal 2 (Week 4): Pt will complete x4 steps with modA +1. PT Short Term Goal 3 (Week 4): Pt will perform bed to chair transfer consistently with minA.  Skilled Therapeutic Interventions/Progress Updates:     Pt received supine in bed and agrees to therapy. During session reports some pain in L shoulder, number not provided. PT provides rest breaks as needed to manage pain. Pt performs supine to sit with supervision for safety. Sit to stand with minA and ambulatory transfer to toilet with modA. Pt continent of urine and performs pericare independently. Pt ambulates to sink with modA and washes hands with CGA.WC transport to gym for time management. Pt performs NMR for L lower extremity with mirror positioned for visual feedback. Multiple reps of sit to stand performed with minA. Pt performs toe tapping with R leg on 5 inch step, with PT facilitating L lateral weight shift and providing consistent mod blocking of L knee. Throughout session PT gradually decreases manual assistance for L knee extension and pt able to perform occasionally without assistance, but quickly reverts to flexed knee posture on L. Pt also perform toe taps laterally with R leg to increase engagement of L hip abductors. Pt requires supine rest break following activity.   Pt ambulates 170' without AD and with modA. PT provides verbal and tactile cues for L leg progression and engaging L hip abductors during stance phase. Following seated rest break, pt ambulates additional 230', with focus on increased safety awareness during turns due to pt's difficulty with L inattention and motor planning  deficits.  Pt left seated in WC at RN station with all needs within reach.   Therapy Documentation Precautions:  Precautions Precautions: Fall,Other (comment) Precaution Comments: Lt radial head fx; PIP fx Rt little finger, and MCP fx Rt ring finger; unrestricted ROM LUE per Dr Doreatha Martin note 10/29 Required Braces or Orthoses: Splint/Cast Splint/Cast: ulnar gutter splint (RUE); Lt elbow bean bag splint for elbow extension;  Lt UE NWB; peg Restrictions Weight Bearing Restrictions: No RUE Weight Bearing: Weight bearing as tolerated LUE Weight Bearing: Weight bearing as tolerated RLE Weight Bearing: Weight bearing as tolerated LLE Weight Bearing: Touchdown weight bearing    Therapy/Group: Individual Therapy  Breck Coons, PT, DPT 07/14/2020, 12:53 PM

## 2020-07-14 NOTE — Progress Notes (Signed)
Speech Language Pathology Daily Session Note  Patient Details  Name: Beth [name] MRN: [id number] Date of Birth: 12-Oct-1985  Today's Date: 07/14/2020 SLP Individual Time: 1100-1155 SLP Individual Time Calculation (min): 55 min  Short Term Goals: Week 5: SLP Short Term Goal 1 (Week 5): Patient will verbalize orientation to place, time (month/year) and situation with Min verbal and visual cues. SLP Short Term Goal 2 (Week 5): Patient will demonstrate sustained attention to a functional task for 20 minutes with Mod verbal cues for redirection. SLP Short Term Goal 3 (Week 5): Patient will demonstrate functional problem solving for basic and familiar tasks with Min verbal cues. SLP Short Term Goal 4 (Week 5): Patient will consume current diet with minimal overt s/s of aspiration and Supervision verbal cues for use of swallowing compensatory strategies. SLP Short Term Goal 5 (Week 5): Patient will demonstrate efficient mastication and complete oral clearance with trials of regular textures over 2 sessions with Min verbal cues prior to upgrade.  Skilled Therapeutic Interventions: Skilled treatment session focused on dysphagia and cognitive goals. SLP facilitated session by providing trials of regular textures. Patient demonstrated efficient mastication and complete oral clearance with regular textures, therefore, recommend patient upgrade to regular textures. SLP also facilitated session by providing Min A verbal cues for attention and error awareness during a complex scheduling task. Throughout session, patient perseverative on wanting to talk to her family, going home, feeling anxious, etc. SLP provided support and redirection. Patient with intermittent awareness of emotions and response to hospitalization but required Mod verbal cues for reasoning. Patient completed basic self-care tasks of oral care and voiding while on the commode with supervision verbal cues for problem solving. Patient handed off  to OT. Continue with current plan of care.      Pain Pain Assessment Pain Scale: 0-10 Pain Score: 0-No pain  Therapy/Group: Individual Therapy  Beth [name] 07/14/2020, 12:15 PM
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
## 2020-07-15 ENCOUNTER — Inpatient Hospital Stay (HOSPITAL_COMMUNITY): Payer: Medicaid Other | Admitting: Speech Pathology

## 2020-07-15 ENCOUNTER — Inpatient Hospital Stay (HOSPITAL_COMMUNITY): Payer: Medicaid Other

## 2020-07-15 MED ORDER — QUETIAPINE FUMARATE 50 MG PO TABS
50.0000 mg | ORAL_TABLET | Freq: Every day | ORAL | Status: DC
  Administered 2020-07-15 – 2020-07-19 (×5): 50 mg via ORAL
  Filled 2020-07-15 (×5): qty 1
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
## 2020-07-15 NOTE — Progress Notes (Signed)
Pt c/o pain to G-tube site, noted brown and yellow drainage, cleaned and applied split gauze dressing. Bumper in place, balloon appears to be intact, no suture to hold in place. Called wound care and informed to apply foam dressing to site and cut split in foam and criss cross ends. Will help with drainage and assist to secure G-tube in place.

## 2020-07-15 NOTE — Plan of Care (Signed)
  Problem: RH Expression Communication Goal: LTG Patient will express needs/wants via multi-modal(SLP) Description: LTG:  Patient will express needs/wants via multi-modal communication (gestures/written, etc) with cues (SLP) Outcome: Not Applicable Note: Goal discharged due to progress. Patient's verbal expression is intact and patient does not need to utilize multimodal communication.     Problem: RH Swallowing Goal: LTG Patient will consume least restrictive diet using compensatory strategies with assistance (SLP) Description: LTG:  Patient will consume least restrictive diet using compensatory strategies with assistance (SLP) Flowsheets (Taken 07/15/2020 0621) LTG: Pt Patient will consume least restrictive diet using compensatory strategies with assistance of (SLP): Supervision Note: Goal upgraded due to progress  Goal: LTG Patient will participate in dysphagia therapy to increase swallow function with assistance (SLP) Description: LTG:  Patient will participate in dysphagia therapy to increase swallow function with assistance (SLP) Flowsheets (Taken 07/15/2020 7169) LTG: Pt will participate in dysphagia therapy to increase swallow function with assistance of (SLP): Supervision Note: Goal upgraded due to progress    Problem: RH Comprehension Communication Goal: LTG Patient will comprehend basic/complex auditory (SLP) Description: LTG: Patient will comprehend basic/complex auditory information with cues (SLP). Flowsheets (Taken 07/15/2020 0621) LTG: Patient will comprehend: Complex auditory information LTG: Patient will comprehend auditory information with cueing (SLP): Supervision Note: Goal upgraded due to progress    Problem: RH Problem Solving Goal: LTG Patient will demonstrate problem solving for (SLP) Description: LTG:  Patient will demonstrate problem solving for basic/complex daily situations with cues  (SLP) Flowsheets (Taken 07/15/2020 0621) LTG: Patient will demonstrate  problem solving for (SLP): Complex daily situations Note: Goal upgraded due to progress    Problem: RH Attention Goal: LTG Patient will demonstrate this level of attention during functional activites (SLP) Description: LTG:  Patient will will demonstrate this level of attention during functional activites (SLP) Flowsheets (Taken 07/15/2020 0621) Patient will demonstrate during cognitive/linguistic activities the attention type of: Selective LTG: Patient will demonstrate this level of attention during cognitive/linguistic activities with assistance of (SLP): Minimal Assistance - Patient > 75% Note: Goal upgraded due to progress    Problem: RH Awareness Goal: LTG: Patient will demonstrate awareness during functional activites type of (SLP) Description: LTG: Patient will demonstrate awareness during functional activites type of (SLP) Flowsheets (Taken 07/15/2020 0621) Patient will demonstrate during cognitive/linguistic activities awareness type of: Emergent LTG: Patient will demonstrate awareness during cognitive/linguistic activities with assistance of (SLP): Minimal Assistance - Patient > 75% Note: Goal upgraded due to progress

## 2020-07-15 NOTE — Progress Notes (Signed)
Speech Language Pathology Weekly Progress and Session Note  Patient Details  Name: Beth [name] MRN: [id number] Date of Birth: 06/24/1986  Beginning of progress report period: July 08, 2020 End of progress report period: July 15, 2020  Today's Date: 07/15/2020 SLP Individual Time: 2458-0998 SLP Individual Time Calculation (min): 55 min  Short Term Goals: Week 5: SLP Short Term Goal 1 (Week 5): Patient will verbalize orientation to place, time (month/year) and situation with Min verbal and visual cues. SLP Short Term Goal 2 (Week 5): Patient will demonstrate sustained attention to a functional task for 20 minutes with Mod verbal cues for redirection. SLP Short Term Goal 2 - Progress (Week 5): Met SLP Short Term Goal 3 (Week 5): Patient will demonstrate functional problem solving for basic and familiar tasks with Min verbal cues. SLP Short Term Goal 3 - Progress (Week 5): Met SLP Short Term Goal 4 (Week 5): Patient will consume current diet with minimal overt s/s of aspiration and Supervision verbal cues for use of swallowing compensatory strategies. SLP Short Term Goal 4 - Progress (Week 5): Met SLP Short Term Goal 5 (Week 5): Patient will demonstrate efficient mastication and complete oral clearance with trials of regular textures over 2 sessions with Min verbal cues prior to upgrade. SLP Short Term Goal 5 - Progress (Week 5): Met    New Short Term Goals: Week 6: SLP Short Term Goal 1 (Week 6): STGs=LTGs due to ELOS  Weekly Progress Updates: Patient continues to make excellent gains and has met 5 of 5 STGs this reporting period. Currently, patient is consuming regular textures with thin liquids with minimal overt s/s of aspiration and requires supervision verbal cues for use of swallowing compensatory strategies. Patient also demonstrates behaviors consistent with a Rancho Level VII and requires overall Min A verbal cues to complete functional and familiar tasks safely in regards  to problem solving, recall, attention and awareness. Patient and family education ongoing. Patient would benefit from continued skilled SLP intervention to maximize her cognitive and swallowing function prior to discharge.    Intensity: Minumum of 1-2 x/day, 30 to 90 minutes Frequency: 3 to 5 out of 7 days Duration/Length of Stay: 07/22/20 Treatment/Interventions: Cognitive remediation/compensation;Cueing hierarchy;Environmental controls;Functional tasks;Internal/external aids;Dysphagia/aspiration precaution training;Patient/family education;Therapeutic Activities   Daily Session  Skilled Therapeutic Interventions:  Skilled treatment session focused on cognitive goals. Upon arrival, patient was awake in bed and agreeable to participate in treatment session. Patient was transferred to the wheelchair and performed basic self-care tasks at the sink (brushing teeth, etc) with Mod I. SLP facilitated session by  re-administering the [facility] Mental Status Examination (SLUMS). Patient scored  17/30 points with a score of 27 or above considered normal. Patient continues to demonstrate deficits in attention, short-term recall and problem solving. However, patient's score improved by 4 points since initial assessment. Patient also verbalized that she wanted to start reading her Bible and the importance of prayer prior to hospitalization. SLP provided patient with a daily devotional in which she read aloud with extra time. However, Mod-Max verbal and visual cues were needed for problem solving in regards to navigating her Bible to locate the appropriate verse. Patient transferred back to bed at end of session and left with alarm on and all needs within reach. Continue with current plan of care.        Pain No/Denies Pain   Therapy/Group: Individual Therapy  Shelbee Apgar 07/15/2020, 6:17 AM

## 2020-07-15 NOTE — Progress Notes (Signed)
Occupational Therapy Session Note  Patient Details  Name: Beth [name] MRN: [id number] Date of Birth: [birth year]  Today's Date: 07/15/2020 OT Individual Time: 1345-1457 OT Individual Time Calculation (min): 72 min    Skilled Therapeutic Interventions/Progress Updates:    1:1. Pt received In bed agreeable to OT. Pt hyperverbal this session but appropriate otherwise. Pt with good insight and discussing issues with coping with changes. Pt completes supine>sitting EOB with S and stand pivot transfer to w/c with MIN A. Pt declines bathing and dressing this session as she recalled shower yesterday. Pt completes making a card with materials set on L and pt writes a note to primary tx team as a thank you with question cues to recall to sign name. Pt completes LUE NMR with arm skate at table top with mobilization of scapula and min facilitation of tricep extension and shoulder extension. Pt ambulates throughout ADL aparment kitche with decreased L body awareness during search activity in items hidden in cabinets requiring mod A for mobility and mod VC for L body awareness. Pt completes folding towels at tabletop seated to improve BUE coordination and L hand awareness with MAX A and MAX VC for gross grasp/release of LUE. Exited session with pt seated in bed, exit alarm on and call light in reach   Therapy Documentation Precautions:  Precautions Precautions: Fall,Other (comment) Precaution Comments: Lt radial head fx; PIP fx Rt little finger, and MCP fx Rt ring finger; unrestricted ROM LUE per Dr Doreatha Martin note 10/29 Required Braces or Orthoses: Splint/Cast Splint/Cast: ulnar gutter splint (RUE); Lt elbow bean bag splint for elbow extension;  Lt UE NWB; peg Restrictions Weight Bearing Restrictions: No RUE Weight Bearing: Weight bearing as tolerated LUE Weight Bearing: Weight bearing as tolerated RLE Weight Bearing: Weight bearing as tolerated LLE Weight Bearing: Touchdown weight bearing General:    Vital Signs: Therapy Vitals Temp: 97.8 F (36.6 C) Pulse Rate: 91 Resp: 17 BP: (!) 106/91 Patient Position (if appropriate): Sitting Oxygen Therapy SpO2: 97 % O2 Device: Room Air Pain:   ADL: ADL Eating: NPO Upper Body Bathing: Dependent Where Assessed-Upper Body Bathing: Bed level Lower Body Bathing: Dependent (+2) Where Assessed-Lower Body Bathing: Bed level Upper Body Dressing: Dependent Where Assessed-Upper Body Dressing: Bed level Lower Body Dressing: Dependent Where Assessed-Lower Body Dressing: Bed level Toileting: Dependent Vision   Perception    Praxis   Exercises:   Other Treatments:     Therapy/Group: Individual Therapy  Tonny Branch 07/15/2020, 2:58 PM

## 2020-07-15 NOTE — Progress Notes (Signed)
Patient ID: Beth [name], female   DOB: 1986/06/04, 34 y.o.   MRN: [id number]  SW spoke with Beth [name] ([phone]/f:[phone]; red [id number]) to confirm if clinical documents were received. Reports received documents. Stated on 12/28, will need updated clinicals within 48hrs including labs (CBC), and negative COVID test. SW informed will have co-workers follow-up to confirm final details as SW will not be in on pt date of d/c.   SW followed up with Beth [name] [phone]) to discuss pt fiance's decision to take pt home. Reports she received same information after speaking with him on yesterday. Reported they would not have been able to accept pt under auto insurance plan, only Medicaid. They would be willing to accept pt into their outpatient program. No further follow-up required.   SW faxed clinical records to Beth [name]/[facility] is the provider [phone]) as records still have not been received. SW spoke with Beth [name] to discuss if Beth [name] would be willing to coordinate a referral for interventional radiologist to have PEG removed. Reports she will speak with physician and follow-up. Also repots pt upcoming appointment on 12/30 was cancelled as Beth [name] is not in the office. Rescheduled appointment for Dr. Nolon [name], on Monday, January 3 at 11:15am. SW faxed updated clinical notes to Beth [name].   SW spoke with radiology and requested a CD of records. They will begin to work on this. SW requested records by 12/27 or 12/28.  SW updated [name]), and [name]) to inform on above. He expressed concerns related to the reason pt continues to have a PEG and would like to know if it can be removed . SW spoke with fiance sister Beth [name]) to discuss [name] and expressed importance of knowing [name] once arranged so our medical team can have things prepared. SW to send DME  orders to [facility] or [facility] [employer].   SW returned phone call to [name] she received updates on coordinating transportation in [place] and was instructed to have SW call; Case #:[id number]. SW called [employer] [phone]) to discuss arranging transportation for pt. SW waiting on follow-up.  SW to fax over 3in1 BSC, TTB, and w/c order to [facility] (B:[phone]/O:[phone]) with clinical documentation to support.   SW received updates from [place] [facility] [phone]) who reported did not think she would be appropriate for outpatient therapies. Thinks intensive inpatient is more appropriate and will send referral to program director. SW faxed updated MAR.   SW faxed updated ED notes to Beth [name].   Beth [name], MSW, [place] Office: [name] Cell: [name] Fax: [name]

## 2020-07-15 NOTE — Progress Notes (Signed)
Pt slept through the night from 2100-0545 only awakening to use the restroom. Pt's significant other stayed the night.

## 2020-07-15 NOTE — Progress Notes (Signed)
Physical Therapy Weekly Progress Note  Patient Details  Name: Beth [name] MRN: [id number] Date of Birth: Mar 18, 1986  Beginning of progress report period: July 08, 2020 End of progress report period: July 15, 2020  Today's Date: 07/15/2020 PT Individual Time: 1100-1155 PT Individual Time Calculation (min): 55 min   Patient has met 2 of 3 short term goals.  Pt is progressing very well toward mobility goals, also demonstrating improvements in cognition and awareness. Pt is consistently performing bed mobility with supervision, sit to stand transfers typically at minA and occasionally CGA, bed to chair transfers modA and occasionally minA, and ambulation with modA +1 and no AD. Pt has improved motor control and strength in L leg but continues to have weakness and lack of coordination in L leg. Focus of coming week to be improved independence and consistency with transfers, ambulation, stair training, and safety awareness.  Patient continues to demonstrate the following deficits muscle weakness, decreased cardiorespiratoy endurance, decreased coordination and decreased motor planning, decreased attention, decreased awareness, decreased problem solving, decreased safety awareness and decreased memory and decreased sitting balance, decreased standing balance, decreased postural control, hemiplegia and decreased balance strategies and therefore will continue to benefit from skilled PT intervention to increase functional independence with mobility.  Patient progressing toward long term goals..  Continue plan of care.  PT Short Term Goals Week 5:  PT Short Term Goal 1 (Week 5): STGs = LTGs due to ELOS  Skilled Therapeutic Interventions/Progress Updates:  Ambulation/gait training;Discharge planning;Functional mobility training;Psychosocial support;Therapeutic Activities;Visual/perceptual remediation/compensation;Balance/vestibular training;Disease management/prevention;Neuromuscular  re-education;Skin care/wound management;Therapeutic Exercise;Wheelchair propulsion/positioning;Cognitive remediation/compensation;DME/adaptive equipment instruction;Pain management;Splinting/orthotics;UE/LE Strength taining/ROM;Community reintegration;Functional electrical stimulation;Patient/family education;Stair training;UE/LE Coordination activities   Pt received supine in bed and agrees to therapy. Supine to sit with supervision and cues on positioning. Sit to stand with CGA and ambulatory transfer to toilet with minA. Pt is independent with pericare and minA required for transfer back to [facility]. WC transport to gym for time management. Pt performs NMR for L leg and standing balance. Facing practice stairs, pt performs multiple reps of sit to stand with CGA and use of R handrail. PT provides blocking of L knee and pt performs repeated toe taps on 6" step. PT facilitates L lateral weight shift and neutral pelvis positioning and pt performs 3x20 reps, initially utilizing R hand rail but progressing to performing without upper extremity support and with PT providing minA. PT gradually decreases level of manual assistance provided for blocking L knee and pt able to maintain knee extension with frequent verbal cues to bring attention to left leg and posture. Pt then performs same activity but stepping to the side with R leg in order to engage hip abductors.   Pt ambulates 2x100' with extended seated rest break. PT provides primarily minA but modA required for turns to the L with pt crossing over with R leg due to L inattention. Pt able to complete 180 degree turns to the R with minA.   Pt left seated in WC at RN station with alarm intact and all needs within reach.  Therapy Documentation Precautions:  Precautions Precautions: Fall,Other (comment) Precaution Comments: Lt radial head fx; PIP fx Rt little finger, and MCP fx Rt ring finger; unrestricted ROM LUE per Dr Doreatha Martin note 10/29 Required Braces or  Orthoses: Splint/Cast Splint/Cast: ulnar gutter splint (RUE); Lt elbow bean bag splint for elbow extension;  Lt UE NWB; peg Restrictions Weight Bearing Restrictions: No RUE Weight Bearing: Weight bearing as tolerated LUE Weight Bearing: Weight bearing as tolerated RLE  Weight Bearing: Weight bearing as tolerated LLE Weight Bearing: Touchdown weight bearing   Therapy/Group: Individual Therapy  Breck Coons, PT, DPT 07/15/2020, 3:51 PM

## 2020-07-16 ENCOUNTER — Inpatient Hospital Stay (HOSPITAL_COMMUNITY): Payer: Medicaid Other

## 2020-07-16 ENCOUNTER — Inpatient Hospital Stay (HOSPITAL_COMMUNITY): Payer: Medicaid Other | Admitting: Speech Pathology

## 2020-07-16 ENCOUNTER — Inpatient Hospital Stay (HOSPITAL_COMMUNITY): Payer: Medicaid Other | Admitting: Occupational Therapy

## 2020-07-16 NOTE — Progress Notes (Signed)
Patient ID: Beth [name], female   DOB: 1986/06/13, 34 y.o.   MRN: [id number]  SW returned phone call/left message for [facility] [phone])  inquiring about pt discharge and if pt will have a case manager at discharge. SW left message informed plans for coordinating care with inhome aide care through [facility] with hopes to be accepted into their outpatient therapy program. SW recommended case manages at discharge.   SW returned phone call from [place] with [facility] [phone]) who is a part of an independent company that will help coordinate care for pt. SW clarified above with regard to services pt will have at discharge. She requested an H&P. SW informed unable to provide this information. SW to provide contact information for pt mother and fiance. States she will speak with them and will get consent of release for records.   SW faxed over over 3-in-1 BSC, TTB, and w/c order to [facility] (L:[phone]/C:[phone]) with clinical documentation to support DME needs.   Loralee Pacas, MSW, [place] Office: [phone] Cell: [phone] Fax: [phone]
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
## 2020-07-16 NOTE — Progress Notes (Signed)
Speech Language Pathology Daily Session Note  Patient Details  Name: Beth [name] MRN: [id number] Date of Birth: 1986-04-22  Today's Date: 07/16/2020 SLP Individual Time: 1400-1430 SLP Individual Time Calculation (min): 30 min  Short Term Goals: Week 6: SLP Short Term Goal 1 (Week 6): STGs=LTGs due to ELOS  Skilled Therapeutic Interventions:  Patient seen in room with fiance present for portion of session, for skilled ST session focusing on cognitive-linguistic goals. She required min-moderate frequency of cues but mild intensity overall to redirect attention during alternating attention task of looking up food items in calorie counting book. She initially required modA for recall of food item she was looking for and she would sometimes try to look up an alternate item when she couldn't remember. As task progressed, patient demonstrated improved performance. She was able to recall food items and wrote them on a list without difficulty and when recalling the calorie numbers for each item, she generally would get numbers mixed up or was one number off, but did demonstrate general recall overall. She was able to calculate total of three, 3-digit numbers without cues. She was able to recall that friend who called in the middle of this session was going to call her back. Patient continues to maximize cognitive-linguistic and swallow goals prior to discharge.  Pain Pain Assessment Pain Scale: 0-10 Pain Score: 0-No pain  Therapy/Group: Individual Therapy  Sonia Baller, MA, CCC-SLP Speech Therapy

## 2020-07-16 NOTE — Progress Notes (Signed)
Occupational Therapy Weekly Progress Note  Patient Details  Name: Beth [name] MRN: [id number] Date of Birth: [birth year]  Beginning of progress report period: June 12, 2020 End of progress report period: July 16, 2020  Today's Date: 07/16/2020 OT Individual Time: 1300-1400 OT Individual Time Calculation (min): 60 min    Patient has met 3 of 3 short term goals.  Patient has made great progress towards OT goals this week. Pt is at an overall min A level for BADL tasks. She still needs cues for safety and awareness, as well as memory, but she has been much less restless and more appropriate during BADL sessions. Continue current POC.   Patient continues to demonstrate the following deficits: muscle weakness, abnormal tone, unbalanced muscle activation, ataxia and decreased coordination, decreased attention to left, decreased initiation, decreased attention, decreased awareness, decreased problem solving, decreased safety awareness, decreased memory and delayed processing and decreased sitting balance, decreased standing balance, decreased postural control, hemiplegia and decreased balance strategies and therefore will continue to benefit from skilled OT intervention to enhance overall performance with BADL and Reduce care partner burden.  Patient progressing toward long term goals..  Continue plan of care.  OT Short Term Goals Week 5:  OT Short Term Goal 1 (Week 5): Patient will complete LB dressing with min A OT Short Term Goal 1 - Progress (Week 5): Met OT Short Term Goal 2 (Week 5): Pt will use L UE as a stabilizer with min questioning cues OT Short Term Goal 2 - Progress (Week 5): Met OT Short Term Goal 3 (Week 5): Pt will complete toilet transfer with min A OT Short Term Goal 3 - Progress (Week 5): Met Week 6:  OT Short Term Goal 1 (Week 6): LTG-STG 2/2 ELOS  Skilled Therapeutic Interventions/Progress Updates:    Patient greeted seated in wc and agreeable to OT treatment  session. Pt requesting to shower. Pt hyperverbose throughout session. But able to maintain attention to task at hand with min cues. Pt ambulated to the bathroom w/ min HHA, she needed min A for clothing management, then had continent void of bladder. Pt ambulated to shower bench for bathing. Hand over hand A to integrate L UE into bathing for neuro re-ed. Dressing completed with verbal cues for hemi techniques and min A. Pt brought to dayroom and worked on  Using L UE as a stabilizer while Warehouse manager card. Pt then ambulated to therapy office to deliver card to SLP with min HHA. Pt ambulated back to room in similar fashion and left semi-reclined in bed with bed alarm, needs met, and significant other entering the room.   Therapy Documentation Precautions:  Precautions Precautions: Fall,Other (comment) Precaution Comments: Lt radial head fx; PIP fx Rt little finger, and MCP fx Rt ring finger; unrestricted ROM LUE per Dr Doreatha Martin note 10/29 Required Braces or Orthoses: Splint/Cast Splint/Cast: ulnar gutter splint (RUE); Lt elbow bean bag splint for elbow extension;  Lt UE NWB; peg Restrictions Weight Bearing Restrictions: No RUE Weight Bearing: Weight bearing as tolerated LUE Weight Bearing: Weight bearing as tolerated RLE Weight Bearing: Weight bearing as tolerated LLE Weight Bearing: Touchdown weight bearing Pain:  denies pain  Therapy/Group: Individual Therapy  Valma Cava 07/16/2020, 2:00 PM

## 2020-07-16 NOTE — Progress Notes (Signed)
Speech Language Pathology Daily Session Note  Patient Details  Name: Robyne Matar MRN: [id number] Date of Birth: [birth year]  Today's Date: 07/16/2020 SLP Individual Time: 0800-0900 SLP Individual Time Calculation (min): 60 min  Short Term Goals: Week 6: SLP Short Term Goal 1 (Week 6): STGs=LTGs due to ELOS  Skilled Therapeutic Interventions:  Pt was seen for skilled ST targeting cognitive goals.  Pt was received in low bed, awake, alert, and agreeable to participating in treatment.  Pt requested to use the bathroom and brush her teeth and completed ADLs with min assist for safety awareness and task sequencing/organization.  SLP facilitated the session with a mildly complex card game to address goals for problem solving.  Pt could plan and execute a problem solving strategy with min verbal cues to recognize and correct errors.  Throughout task, pt also benefited from min verbal cues for redirection to task due to distraction/decreased topic maintenance/verbosity.  Pt was returned to bed at the end of the session with bed alarm set and call bell within reach.  Continue per current plan of care.    Pain Pain Assessment Pain Scale: 0-10 Pain Score: 0-No pain  Therapy/Group: Individual Therapy  Reznor Ferrando, Selinda Orion 07/16/2020, 11:24 AM

## 2020-07-16 NOTE — Progress Notes (Signed)
Physical Therapy Session Note  Patient Details  Name: Beth [name] MRN: [id number] Date of Birth: 23-Jul-1986  Today's Date: 07/16/2020 PT Individual Time: 1102-1159 PT Individual Time Calculation (min): 57 min   Short Term Goals: Week 5:  PT Short Term Goal 1 (Week 5): STGs = LTGs due to ELOS  Skilled Therapeutic Interventions/Progress Updates:     Pt received supine in bed and agrees to therapy. No complaint of pain. Supine to sit with cues on position. Pt perform sit to stand and stand pivot transfer to [facility] with CGA. WC transport to gym for time management. Pt performs NMR for L leg and standing balance, with cognitive overlay. Pt stands at high low table with R leg propped on 5 inch step to limit activation, and L leg on ground with PT blocking knee and providing manual facilitation of neutral pelvic positioning. Pt then tasked with placing colored pegs in specific pattern at arm level. PT provides minA/modA for standing balance with multimodal cues for posture, weight shifting, and L knee extension. Pt requires multiple seated rest breaks during activity and becomes slightly frustrated and discouraged bout weakness in L leg. PT provides education and encouragement as well as rationale for performing activity.   Pt left seated in WC at RN station with alarm intact.  Therapy Documentation Precautions:  Precautions Precautions: Fall,Other (comment) Precaution Comments: Lt radial head fx; PIP fx Rt little finger, and MCP fx Rt ring finger; unrestricted ROM LUE per Dr Doreatha Martin note 10/29 Required Braces or Orthoses: Splint/Cast Splint/Cast: ulnar gutter splint (RUE); Lt elbow bean bag splint for elbow extension;  Lt UE NWB; peg Restrictions Weight Bearing Restrictions: No RUE Weight Bearing: Weight bearing as tolerated LUE Weight Bearing: Weight bearing as tolerated RLE Weight Bearing: Weight bearing as tolerated LLE Weight Bearing: Touchdown weight bearing   Therapy/Group: Individual  Therapy  Breck Coons, PT, DPT 07/16/2020, 4:19 PM

## 2020-07-16 NOTE — Progress Notes (Signed)
Occupational Therapy Session Note  Patient Details  Name: Ebony Yorio MRN: [id number] Date of Birth: [birth year]  Ms. Rahal will require and 3-in-1 BSC and a tub transfer bench for safety and to increase her independence within BADL tasks after her brain injury. Ms. Boffa has L hemiparesis and requires min A for transfers. She is unable to step over a tub ledge safely and will require a tub bench to safely shower at home. Ms. Oliveira also has urgency issues s/p brain injury and will require a bedside commode.   Daneen Schick Garvey Westcott 07/16/2020, 10:04 AM

## 2020-07-17 ENCOUNTER — Inpatient Hospital Stay (HOSPITAL_COMMUNITY): Payer: Medicaid Other | Admitting: Occupational Therapy

## 2020-07-17 ENCOUNTER — Inpatient Hospital Stay (HOSPITAL_COMMUNITY): Payer: Medicaid Other | Admitting: Physical Therapy

## 2020-07-17 ENCOUNTER — Inpatient Hospital Stay (HOSPITAL_COMMUNITY): Payer: Medicaid Other

## 2020-07-17 MED ORDER — METHYLPHENIDATE HCL 5 MG PO TABS
5.0000 mg | ORAL_TABLET | Freq: Two times a day (BID) | ORAL | Status: DC
  Administered 2020-07-17 – 2020-07-22 (×10): 5 mg via ORAL
  Filled 2020-07-17 (×10): qty 1
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
## 2020-07-17 NOTE — Progress Notes (Signed)
Physical Therapy Session Note  Patient Details  Name: Beth [name] MRN: [id number] Date of Birth: Jan 30, 1986  Today's Date: 07/17/2020 PT Individual Time: 3543-0148 PT Individual Time Calculation (min): 59 min   Short Term Goals: Week 1:  PT Short Term Goal 1 (Week 1): Patient will participate in meangingful therapy for >5 mins PT Short Term Goal 1 - Progress (Week 1): Met PT Short Term Goal 2 (Week 1): Patient will roll R/L with CGA consistently PT Short Term Goal 2 - Progress (Week 1): Progressing toward goal PT Short Term Goal 3 (Week 1): Patient will transition supine <> sit with MaxA x1 PT Short Term Goal 3 - Progress (Week 1): Met PT Short Term Goal 4 (Week 1): Patient will transfer bed<> wc MaxA x1 with LRAD PT Short Term Goal 4 - Progress (Week 1): Partly met (Inconsitent max A +1 to mod A +2)  Skilled Therapeutic Interventions/Progress Updates:  Pt was seen bedside in the am. Pt performed bed mobility with S and verbal cues. Pt performed multiple sit to stand and stand pivot transfers with min A and verbal cues. Pt ambulated 80 feet x 3 with hand hold assist and min to mod A with verbal cues. Pt performed step taps, 3 sets x 10 reps each. Pt returned to room and left sitting up in bed with bed alarm and call bell within reach.   Therapy Documentation Precautions:  Precautions Precautions: Fall,Other (comment) Precaution Comments: Lt radial head fx; PIP fx Rt little finger, and MCP fx Rt ring finger; unrestricted ROM LUE per Dr Doreatha Martin note 10/29 Required Braces or Orthoses: Splint/Cast Splint/Cast: ulnar gutter splint (RUE); Lt elbow bean bag splint for elbow extension;  Lt UE NWB; peg Restrictions Weight Bearing Restrictions: No RUE Weight Bearing: Weight bearing as tolerated LUE Weight Bearing: Weight bearing as tolerated RLE Weight Bearing: Weight bearing as tolerated LLE Weight Bearing: Touchdown weight bearing General:   Pain: No c/o pain.   Therapy/Group:  Individual Therapy  Dub Amis 07/17/2020, 12:28 PM

## 2020-07-17 NOTE — Progress Notes (Signed)
Occupational Therapy Session Note  Patient Details  Name: Beth [name] MRN: [id number] Date of Birth: 01-Sep-1985   Today's Date: 07/17/2020 OT Individual Time: 3403-5248 OT Individual Time Calculation (min): 74 min   Short Term Goals: Week 6:   LTG=STG 2/2 ELOS  Skilled Therapeutic Interventions/Progress Updates:    Patient greeted semi-reclined in bed and agreeable to OT treatment session focused on self-care retraining and L UE NMR.Pt ambulated to bathroom with Min HHA and transferred onto commode. Pt voided bladder and completed peri-care. Bathing completed from shower bench with set-up A and and hand over hand A to integrate L UE into bathing tasks. Dressing from wc with min A for hemi dressing with verbal cues to recall techniques. Pt ate her lunch seated in wc with min verbal cues for bite sizes. Pt ambulated around the unit with min HHA, but occasional mod A when distracted. Applied 1:1 NMES to CH1 wrist extensors.  Ratio 1:1 Rate 35 pps Waveform- Asymmetric Ramp 1.0 Pulse 300 CH1 Intensity- 15  Duration -  30  Pt returned to bed and left semi-reclined in bed with needs met.  OT returned in 30 minutes to remove e-stim with skin intact and no adverse reactions.   Therapy Documentation Precautions:  Precautions Precautions: Fall Precaution Comments: Rt hemiplegia - subluxation Restrictions Weight Bearing Restrictions: No Pain:   denies pain  Therapy/Group: Individual Therapy  Valma Cava 07/17/2020, 1:14 PM

## 2020-07-17 NOTE — Progress Notes (Addendum)
Patient ID: Beth [name], female   DOB: [birth year], 34 y.o.   MRN: [id number]  07/16/20- SW received consent of release form from Laurie/RN CM with [facility] CCM [phone]) for family to sign.  07/17/20- SW spoke with [name] to inform on form received, and will leave in room for them to sign and SW will pick up fax back to [employer]. SW also informed Rx for ramp left in room as well.  *SW faxed consent of release to [facility] along with letter supporting pt unable to make decisions for herself at this time; fax#[phone].  Loralee Pacas, MSW, [place] Office: [phone] Cell: [phone] Fax: [phone]

## 2020-07-17 NOTE — Progress Notes (Signed)
Speech Language Pathology Daily Session Note  Patient Details  Name: Beth [name] MRN: [id number] Date of Birth: 10/08/1985  Today's Date: 07/17/2020 SLP Individual Time: 3614-4315 SLP Individual Time Calculation (min): 55 min  Short Term Goals: Week 6: SLP Short Term Goal 1 (Week 6): STGs=LTGs due to ELOS  Skilled Therapeutic Interventions:Skilled ST services focused on cognitive skills. Pt was tangential and appeared anxious throughout the session, but was easily redirected for short periods of time. SLP facilitated mildly complex problem solving, error awareness and recall skills within in task utilizing organization worksheet (oragnizng closet.) Pt required max A verbal cues, but when given step by step instruction could complete it with mod A verbal cues. Pt expressed desire to read the bible. SLP facilitated reading comprehension skills utilizing paragraphs from [employer], pt demonstrated basic reading comprehension of basic-mildly complex paragraphs in response to follow up questions but comprehension was reduced, further impacted by recall deficits once the complexity level increased. Pt was left in room with call bell within reach and bed alarm set. SLP recommends to continue skilled services.     Pain Pain Assessment Pain Scale: 0-10 Pain Score: 4  Pain Type: Acute pain Pain Location: Abdomen Pain Orientation: Mid Pain Intervention(s): Medication (See eMAR)  Therapy/Group: Individual Therapy  Kailyn Dubie  [facility] 07/17/2020, 6:47 AM
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
## 2020-07-19 ENCOUNTER — Inpatient Hospital Stay (HOSPITAL_COMMUNITY): Payer: Medicaid Other | Admitting: Speech Pathology

## 2020-07-19 ENCOUNTER — Inpatient Hospital Stay (HOSPITAL_COMMUNITY): Payer: Medicaid Other

## 2020-07-19 NOTE — Progress Notes (Signed)
Physical Therapy Session Note  Patient Details  Name: Beth [name] MRN: [id number] Date of Birth: 04-22-1986  Today's Date: 07/19/2020 PT Individual Time: 0930-1027 PT Individual Time Calculation (min): 57 min   Short Term Goals: Week 5:  PT Short Term Goal 1 (Week 5): STGs = LTGs due to ELOS  Skilled Therapeutic Interventions/Progress Updates:    Session focused on functional mobility retraining, NMR to address LUE, LLE, balance and coordination, and cognitive remediation.  Pt very verbal throughout session and tendency to perseverate on stating she "just found out" her L side of her body isn't working and doesn't think her family is including her in her discharge plan. Reminded and encouraged patient of the progress she has made on the L side of her body and overall and that it was not new weakness. This was also reviewed with MD when he came by for rounds. Redirected throughout session.  Functional mobility with overall CGA to min assist except occasional mod assist during gait due to scissoring of LE's of decreased coordination on L. Gait several bouts during functional tasks ranging from 50-100' each time while following a set of directions. NMR to LUE in weightbearing while performing functional reaching task with RUE and multistep directions given for colored bean bag placement with min assist. Toe taps with RLE with focus on stance control and attention/awareness of LLE and then transitioned to toe taps with L foot to address coordination and strength x 5 reps each side x 2 sets each with pt counting.   Returned back to bed end of session. All needs in reach and bed alarm intact.   Therapy Documentation Precautions:  Precautions Precautions: Fall,Other (comment) Precaution Comments: Lt radial head fx; PIP fx Rt little finger, and MCP fx Rt ring finger; unrestricted ROM LUE per Dr Doreatha Martin note 10/29 Lt UE WBAT; peg Restrictions Weight Bearing Restrictions: Yes RUE Weight Bearing:  Weight bearing as tolerated LUE Weight Bearing: Weight bearing as tolerated RLE Weight Bearing: Weight bearing as tolerated LLE Weight Bearing: Weight bearing as tolerated    Pain:  Does not complain of pain.    Therapy/Group: Individual Therapy  Canary Brim Ivory Broad, PT, DPT, CBIS  07/19/2020, 12:22 PM

## 2020-07-19 NOTE — Progress Notes (Signed)
Speech Language Pathology Daily Session Note  Patient Details  Name: Beth [name] MRN: [id number] Date of Birth: 03-30-1986  Today's Date: 07/19/2020 SLP Individual Time: 9147-8295 SLP Individual Time Calculation (min): 43 min  Short Term Goals: Week 6: SLP Short Term Goal 1 (Week 6): STGs=LTGs due to ELOS  Skilled Therapeutic Interventions:Skilled ST services focused on cognitive skills. SLP facilitated recall and problem solving skills in novel card task. Pt demonstrated basic problem solving skills with  supervision A verbal cues fade to mod I. SLP instructed pt in card task played at mildly complex level, pt required max A fade to min A verbal cues for problem solving and executive function skills. Pt required supervision A verbal cues fade to mod I to recall 3 rules with visual aid. Pt was left in room with call bell within reach and bed alarm set. SLP recommends to continue skilled services.      Pain Pain Assessment Pain Score: 0-No pain  Therapy/Group: Individual Therapy  Andrina Locken  [facility] 07/19/2020, 3:11 PM

## 2020-07-19 NOTE — Progress Notes (Signed)
Occupational Therapy Session Note  Patient Details  Name: Beth [name] MRN: [id number] Date of Birth: 11-23-1985  Today's Date: 07/19/2020 OT Individual Time: 1345-1444 OT Individual Time Calculation (min): 59 min    Short Term Goals: Week 6:  OT Short Term Goal 1 (Week 6): LTG-STG 2/2 ELOS  Skilled Therapeutic Interventions/Progress Updates:    Pt received on toilet with NT assisting. Pt agreeable to shower, no c/o pain. Pt doffed clothing with min A and cueing for safety. She transferred into shower with min A. Min A for UB/LB bathing, including HOH LUE to reach under her RUE. Pt also required min A for maintaining LLE on the TTB to wash feet and peri areas. Pt stood with use of grab bar and min cueing provided for increasing BOS to maintain balance and increase ease with peri hygiene. Pt with tangential speech this session, requiring redirection to task, and quite down on herself and CLOF. Pt oriented to person, place, and situation. Pt transferred out of shower and with min HHA completed functional mobility to EOB. She donned shirt with min A, requiring assist to thread over LUE. Pants donned with min A to pull up posteriorly. Min cueing for hemi technique. Pt completed oral care at the sink with set up assist. She was taken via w/c to the therapy gym. She sat at high-low table and completed gravity eliminated shoulder flexion and extension on a towel to reduce friction. Mod facilitation overall provided as well as cueing for reduction of trunk compensation. Pt then used arm skate to complete similar exercises as LUE NMR. Pt able to complete elbow flex/ext against gravity with poor coordination but good muscle activation and movement. Pt returned to her room and was left supine with bed lowered, alarm set, all needs within reach.   Therapy Documentation Precautions:  Precautions Precautions: Fall,Other (comment) Precaution Comments: Lt radial head fx; PIP fx Rt little finger, and MCP fx Rt  ring finger; unrestricted ROM LUE per Dr Doreatha Martin note 10/29 Required Braces or Orthoses: Splint/Cast Splint/Cast: ulnar gutter splint (RUE); Lt elbow bean bag splint for elbow extension;  Lt UE NWB; peg Restrictions Weight Bearing Restrictions: Yes RUE Weight Bearing: Weight bearing as tolerated LUE Weight Bearing: Weight bearing as tolerated RLE Weight Bearing: Weight bearing as tolerated LLE Weight Bearing: Touchdown weight bearing   Therapy/Group: Individual Therapy  [name] 07/19/2020, 7:19 AM

## 2020-07-19 NOTE — Progress Notes (Signed)
Speech Language Pathology Daily Session Note  Patient Details  Name: Beth [name] MRN: [id number] Date of Birth: 1986/03/13  Today's Date: 07/19/2020 SLP Individual Time: 0700-0730 SLP Individual Time Calculation (min): 30 min  Short Term Goals: Week 6: SLP Short Term Goal 1 (Week 6): STGs=LTGs due to ELOS  Skilled Therapeutic Interventions: Skilled treatment session focused on cognitive goals. SLP facilitated session by providing extra time and overall supervision level verbal cues for functional problem solving and overall safety during a functional tasks at the sink level. Patient mildly perseverative on going home but was easily redirected. Patient transferred back to bed at end of session and left with alarm on and all needs within reach. Continue with current plan of care.      Pain No/Denies Pain   Therapy/Group: Individual Therapy  [name] 07/19/2020, 12:31 PM
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
## 2020-07-20 ENCOUNTER — Inpatient Hospital Stay (HOSPITAL_COMMUNITY): Payer: Medicaid Other | Admitting: Speech Pathology

## 2020-07-20 ENCOUNTER — Inpatient Hospital Stay (HOSPITAL_COMMUNITY): Payer: Medicaid Other | Admitting: Occupational Therapy

## 2020-07-20 ENCOUNTER — Inpatient Hospital Stay (HOSPITAL_COMMUNITY): Payer: Medicaid Other

## 2020-07-20 ENCOUNTER — Other Ambulatory Visit (HOSPITAL_COMMUNITY): Payer: Self-pay | Admitting: Physician Assistant

## 2020-07-20 LAB — SARS CORONAVIRUS 2 (TAT 6-24 HRS): SARS Coronavirus 2: NEGATIVE

## 2020-07-20 MED ORDER — ACETAMINOPHEN 325 MG PO TABS: 325.0000 mg | ORAL_TABLET | ORAL | Status: AC | PRN

## 2020-07-20 MED ORDER — PANTOPRAZOLE SODIUM 40 MG PO TBEC: 40.0000 mg | DELAYED_RELEASE_TABLET | Freq: Every day | ORAL | 0 refills | Status: DC

## 2020-07-20 MED ORDER — VITAMIN D3 25 MCG PO TABS: 1000.0000 [IU] | ORAL_TABLET | Freq: Every day | ORAL | 0 refills | Status: DC

## 2020-07-20 MED ORDER — QUETIAPINE FUMARATE 25 MG PO TABS: ORAL_TABLET | ORAL | 0 refills | Status: DC

## 2020-07-20 MED ORDER — QUETIAPINE FUMARATE 25 MG PO TABS
25.0000 mg | ORAL_TABLET | Freq: Every day | ORAL | Status: DC
  Administered 2020-07-20 – 2020-07-21 (×2): 25 mg via ORAL
  Filled 2020-07-20 (×2): qty 1

## 2020-07-20 MED ORDER — MELATONIN 3 MG PO TABS: 3.0000 mg | ORAL_TABLET | Freq: Every day | ORAL | 0 refills | Status: DC

## 2020-07-20 MED ORDER — FREE WATER: 50.0000 mL | Freq: Two times a day (BID) | Status: AC

## 2020-07-20 MED ORDER — APIXABAN 5 MG PO TABS: 5.0000 mg | ORAL_TABLET | Freq: Two times a day (BID) | ORAL | 0 refills | Status: DC

## 2020-07-20 MED ORDER — METHYLPHENIDATE HCL 5 MG PO TABS: 5.0000 mg | ORAL_TABLET | Freq: Two times a day (BID) | ORAL | 0 refills | Status: DC

## 2020-07-20 MED FILL — METHYLPHENIDATE 5 MG TABLET: 5 | 30 days supply | Qty: 60 | Fill #0

## 2020-07-20 MED FILL — MELATONIN 3 MG TABS: 3 | 30 days supply | Qty: 30 | Fill #0

## 2020-07-20 MED FILL — QUETIAPINE FUMARATE 25 MG T: 25 | 6 days supply | Qty: 30 | Fill #0

## 2020-07-20 MED FILL — ELIQUIS 5 MG TABLET: 5 | 30 days supply | Qty: 60 | Fill #0

## 2020-07-20 MED FILL — PANTOPRAZOLE SOD DR 40 MG T: 40 | 30 days supply | Qty: 30 | Fill #0

## 2020-07-20 MED FILL — VITAMIN D3 25 MCG TABS: 25 | 30 days supply | Qty: 30 | Fill #0
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
## 2020-07-20 NOTE — Progress Notes (Signed)
Speech Language Pathology Daily Session Note  Patient Details  Name: Beth [name] MRN: [id number] Date of Birth: Feb 23, 1986  Today's Date: 07/20/2020 SLP Individual Time: 1000-1055 SLP Individual Time Calculation (min): 55 min  Short Term Goals: Week 6: SLP Short Term Goal 1 (Week 6): STGs=LTGs due to ELOS  Skilled Therapeutic Interventions: Skilled treatment session focused on cognitive goals. SLP facilitated session by providing extra time and overall Min A verbal cues for problem solving and recall while navigating the Bible for her daily devotional. SLP also facilitated session with a functional conversation that focused on activities she can participate in safely at home as patient was focused on all the tasks she feels like she "can't" do at home. With Min-Mod A verbal cues, patient generated a list of activities he can safely complete. Patient transferred to the commode and was continent of bladder. Patient transferred back to bed and left with alarm on and all needs within reach. Continue with current plan of care.      Pain No/Denies Pain   Therapy/Group: Individual Therapy  Sharel Behne 07/20/2020, 2:39 PM

## 2020-07-20 NOTE — Progress Notes (Signed)
Restless first part of shift. No attempts to get OOB without assistance. Mom called, asking if patient had med for sleep. PRN seroquel 50mg 's given at 2333. Patient slept good after med given. Cleaned peg site, purulent drainage on old dressing. New split foam dressing applied to Peg site. 2334 A

## 2020-07-20 NOTE — Progress Notes (Signed)
Occupational Therapy Session Note  Patient Details  Name: Beth [name] MRN: [id number] Date of Birth: March 18, 1986  Today's Date: 07/20/2020 OT Individual Time: 1438-8875 OT Individual Time Calculation (min): 57 min    Short Term Goals: Week 6:  OT Short Term Goal 1 (Week 6): LTG-STG 2/2 ELOS  Skilled Therapeutic Interventions/Progress Updates:   Pt greeted semi-reclined in bed and agreeable to OT treatment session. Pt completed bed mobility with supervision. Min A to ambulate into bathroom and transfer onto commode. Pt successfully voided bladder and completed peri-care. Bathing completed from shower bench with OT providing hand over hand A to integrate L UE for neuro re-ed. Pt hyperverbose throughout session and needed verbal cues to stay on task at times. Blocked practice for UB dressing with pt able to eventually thread L UE. Reviewed using L hand as a stabilizer within grooming tasks with pt demonstrating understanding.   L UE NMR with joint input to bring through full elbow, shoulder, wrist, hand ROM. Educated on self ROM techniques with pt demonstrating understanding. Pt left seated at the nurses station at end of session with alarm belt on and needs met.  Therapy Documentation Precautions:  Precautions Precautions: Fall,Other (comment) Precaution Comments: Lt radial head fx; PIP fx Rt little finger, and MCP fx Rt ring finger; unrestricted ROM LUE per Dr Doreatha Martin note 10/29 Required Braces or Orthoses: Splint/Cast Splint/Cast: ulnar gutter splint (RUE); Lt elbow bean bag splint for elbow extension;  Lt UE NWB; peg Restrictions Weight Bearing Restrictions: Yes RUE Weight Bearing: Weight bearing as tolerated LUE Weight Bearing: Weight bearing as tolerated RLE Weight Bearing: Weight bearing as tolerated LLE Weight Bearing: Touchdown weight bearing General:  Pain:  denies pain  Therapy/Group: Individual Therapy  Valma Cava 07/20/2020, 9:36 AM

## 2020-07-20 NOTE — Progress Notes (Addendum)
Patient ID: Beth [name], female   DOB: 1985/08/13, 34 y.o.   MRN: [id number]  SW added SW order to [facility] referral [phone]) and faxed over. SW called medical records at [facility] (p:[phone]/[phone]). Reports still not seeing much of records sent. SW faxed past week clinical notes, and informed family will have CD of records at d/c.   SW left message for pt [name] inquiring about transportation for pt and any updates. SW spoke with [name] [phone]) to get updates on transportation, and she informed pt fiance would have more information. SW waiting on follow-up.   SW received updates from [employer] and [employer] reporting that family will need to contact [employer] [phone] to discuss transportation related needs and they will follow-up; primarily because pt has a managed medicaid plan. SW unable to leave message pt mother with above information due to voicemail being full.  Cecile Sheerer, MSW, LCSWA Office: [phone] Cell: [phone] Fax: [phone]

## 2020-07-20 NOTE — Progress Notes (Signed)
Physical Therapy Session Note  Patient Details  Name: Beth [name] MRN: [id number] Date of Birth: Apr 07, 1986  Today's Date: 07/20/2020 PT Individual Time: 1302-1358 PT Individual Time Calculation (min): 56 min   Short Term Goals: Week 5:  PT Short Term Goal 1 (Week 5): STGs = LTGs due to ELOS  Skilled Therapeutic Interventions/Progress Updates:     Pt received seated in [facility] and agrees to therapy. No complaint of paint. Pt performs stand step transfer to toilet with CGA and requires minA to remove pants and brief. Following toileting, pt performs stand step transfer back to [facility] with CGA. Pt ambulates multiple bouts during session 100', 180', 180' and 100'. Pt ambulates without AD and with PT providing primarily CGA to light minA but occasionally modA, especially during direction changes, with R leg crossing over L leg due to L sided neglect. PT cues for upright gaze to improve posture and balance, increased L stride length, increased eccentric control with L knee and increase heel strike on initial contact.  Pt performs supine NMR for L leg to encourage WB and NM return. PT supports R leg to minimize use and pt perform unilateral bridging on L side, with PT providing tactile facilitation of WB through distal L thigh. Pt performs 3x10 with 10 second hold on final rep. Pt performs standing NMR for L leg, performing slow R leg lifts while PT provides minA/modA at hips to facilitate L weight shift and minA at L knee for blocking. Pt then perform slight knee bends and then returns to full extension with L leg, working on eccentric control and isometric contractions.  Pt left supine in bed with alarm intact and all needs within reach.  Therapy Documentation Precautions:  Precautions Precautions: Fall,Other (comment) Precaution Comments: Lt radial head fx; PIP fx Rt little finger, and MCP fx Rt ring finger; unrestricted ROM LUE per Dr Jena Gauss note 10/29 Required Braces or Orthoses:  Splint/Cast Splint/Cast: ulnar gutter splint (RUE); Lt elbow bean bag splint for elbow extension;  Lt UE NWB; peg Restrictions Weight Bearing Restrictions: Yes RUE Weight Bearing: Weight bearing as tolerated LUE Weight Bearing: Weight bearing as tolerated RLE Weight Bearing: Weight bearing as tolerated LLE Weight Bearing: Touchdown weight bearing    Therapy/Group: Individual Therapy  Beau Fanny, PT, DPT 07/20/2020, 3:53 PM

## 2020-07-21 ENCOUNTER — Inpatient Hospital Stay (HOSPITAL_COMMUNITY): Payer: Medicaid Other

## 2020-07-21 ENCOUNTER — Inpatient Hospital Stay (HOSPITAL_COMMUNITY): Payer: Medicaid Other | Admitting: Speech Pathology

## 2020-07-21 ENCOUNTER — Inpatient Hospital Stay (HOSPITAL_COMMUNITY): Payer: Medicaid Other | Admitting: Occupational Therapy

## 2020-07-21 NOTE — Progress Notes (Signed)
Speech Language Pathology Discharge Summary  Patient Details  Name: Beth [name] MRN: [id number] Date of Birth: 08/23/1985  Today's Date: 07/21/2020 SLP Individual Time: 1100-1155 SLP Individual Time Calculation (min): 55 min   Skilled Therapeutic Interventions:  Skilled treatment session focused on cognitive goals. SLP facilitated session by providing overall Min A verbal cues for patient to complete a complex scheduling task. Patient demonstrated appropriate awareness in regards to difficulty of task and required education regarding cognitive deficits since TBI. Patient perseverative on topics of conversation and required Min verbal cues for redirection. SLP attempted to provide emotional support and provide examples of tasks she can try to do to help relax. She verbalized understanding but will need reinforcement. Patient transferred back to bed at end of session and left with alarm on and all needs within reach. Continue with current plan of care.   Patient has met 7 of 7 long term goals.  Patient to discharge at overall Supervision;Min level.   Reasons goals not met: N/A   Clinical Impression/Discharge Summary: Patient has made excellent gains and has met 7 of 7 LTGs this admission. Currently, patient is demonstrating behaviors consistent with a Rancho Level VII and requires overall Min A verbal cues to complete functional and mildly complex tasks safely in regards to sustained attention, functional problem solving, recall of functional information and emergent awareness. Patient is 100% intelligible at the conversation level but requires Min verbal cues for a slow rate of speech and turn taking with intermittent verbal perseveration noted on topics. Patient is consuming regular textures with thin liquids without overt s/s of aspiration and requires overall supervision level verbal cues for use of swallowing compensatory strategies. Patient and family education is complete and patient will  discharge home with 24 hour supervision from family. Patient would benefit from f/u outpatient SLP services to maximize her cognitive functioning and overall functional independence in order to reduce caregiver burden.   Care Partner:  Caregiver Able to Provide Assistance: Yes  Type of Caregiver Assistance: Physical;Cognitive  Recommendation:  24 hour supervision/assistance;Outpatient SLP  Rationale for SLP Follow Up: Reduce caregiver burden;Maximize cognitive function and independence   Equipment: N/A   Reasons for discharge: Discharged from hospital;Treatment goals met   Patient/Family Agrees with Progress Made and Goals Achieved: Yes    ,  07/21/2020, 6:34 AM    

## 2020-07-21 NOTE — Progress Notes (Signed)
Physical Therapy Discharge Summary  Patient Details  Name: Beth [name] MRN: [id number] Date of Birth: 04-29-1986  Today's Date: 07/21/2020 PT Individual Time: 9390-3009 PT Individual Time Calculation (min): 74 min    Patient has met 9 of 10 long term goals due to improved activity tolerance, improved balance, improved postural control, increased strength, improved attention, improved awareness and improved coordination.  Patient to discharge at an ambulatory level [place].   Patient's care partner is independent to provide the necessary physical and cognitive assistance at discharge.  Reasons goals not met: Pt will have 24 hour supervision at home and will have assistance with WC mobility so goal not addressed.  Recommendation:  Patient will benefit from ongoing skilled PT services in outpatient setting to continue to advance safe functional mobility, address ongoing impairments in L sided strength and NMR, balance, ambulation, and minimize fall risk.  Equipment: Recommended 18" x18" manual WC  Reasons for discharge: treatment goals met and discharge from hospital  Patient/family agrees with progress made and goals achieved: Yes   Skilled therapeutic Interventions: Pt received supine in bed and agrees to therapy. No complaint of pain. Pt performs supine to sit with supervision for safety. Stand pivot transfer to [facility] with CGA and no AD, PT cueing for positioning and sequencing. WC transport to gym for time management. Pt performs car transfer x2 with CGA, working on hand placement and sequencing. Pt ambulates up and down ramp with minA and no AD. Seated rest break prior to additional mobility. Pt completes x12 steps with R handrail and PT providing CGA/minA at hips for stability with cues on stepping pattern and foot placement. Stand pivot to mat table with CGA. Pt completes floor transfer with minA/modA and PT demonstration prior to attempting. Pt ambulates x300' with minA for slight L  lean and occasoinally catching L toe during swing phase. PT places 3lb weight on pt's L ankle to provide proprioceptive input and pt ambulates additional 100' with minA. PT cues for upright gaze to improve posture and balance, increasing L stride length, L hip abductor activation, and cued to not crossover R foot during turns. Pt left seated in WC with all needs within reach.  PT Discharge Precautions/Restrictions Precautions Precautions: Fall Precaution Comments: Lt radial head fx; PIP fx Rt little finger, and MCP fx Rt ring finger; unrestricted ROM LUE per Dr Doreatha Martin note 10/29 Restrictions Weight Bearing Restrictions: No RUE Weight Bearing: Weight bearing as tolerated LUE Weight Bearing: Weight bearing as tolerated RLE Weight Bearing: Weight bearing as tolerated LLE Weight Bearing: Weight bearing as tolerated Vision/Perception  Praxis Praxis: Impaired Praxis Impairment Details: Motor planning;Perseveration  Cognition Overall Cognitive Status: Impaired/Different from baseline Arousal/Alertness: Awake/alert Orientation Level: Oriented X4 Attention: Sustained Focused Attention: Appears intact Sustained Attention: Impaired Sustained Attention Impairment: Verbal basic;Functional basic Memory: Impaired Memory Impairment: Decreased recall of new information;Decreased short term memory Decreased Short Term Memory: Functional basic;Verbal basic Awareness: Impaired Awareness Impairment: Anticipatory impairment Problem Solving: Impaired Problem Solving Impairment: Functional complex;Verbal complex Safety/Judgment: Impaired Rancho [employer] Scales of Cognitive Functioning: Automatic/appropriate Sensation Sensation Light Touch: Appears Intact Coordination Gross Motor Movements are Fluid and Coordinated: No Fine Motor Movements are Fluid and Coordinated: No Heel Shin Test: decreased smoothness and accuracy Motor  Motor Motor: Motor apraxia;Hemiplegia Motor - Discharge Observations: L  sided hemiplegia improved fromi eval  Mobility Bed Mobility Bed Mobility: Sit to Supine;Supine to Sit Rolling Right: Supervision/verbal cueing Rolling Left: Supervision/Verbal cueing Supine to Sit: Supervision/Verbal cueing Sit to Supine: Supervision/Verbal cueing Transfers  Transfers: Sit to Stand;Stand to Sit;Stand Pivot Transfers;Squat Pivot Transfers Sit to Stand: Contact Guard/Touching assist Stand to Sit: Contact Guard/Touching assist Stand Pivot Transfers: Contact Guard/Touching assist Transfer (Assistive device): None Locomotion  Gait Ambulation: Yes Gait Assistance: Minimal Assistance - Patient > 75% Gait Distance (Feet): 300 Feet Assistive device: None Gait Assistance Details: Verbal cues for gait pattern;Manual facilitation for weight shifting;Manual facilitation for placement;Verbal cues for precautions/safety;Verbal cues for technique;Tactile cues for posture Gait Gait: Yes Gait Pattern: Impaired Gait Pattern: Decreased weight shift to left;Decreased step length - right;Decreased step length - left;Decreased hip/knee flexion - left;Poor foot clearance - left;Decreased trunk rotation Gait velocity: decreased Stairs / Additional Locomotion Stairs: Yes Stairs Assistance: Minimal Assistance - Patient > 75% Stair Management Technique: One rail Right Number of Stairs: 12 Height of Stairs: 6 Ramp: Contact Guard/touching assist Curb: Minimal Assistance - Patient >75% Wheelchair Mobility Wheelchair Mobility: No  Trunk/Postural Assessment  Cervical Assessment Cervical Assessment: Within Functional Limits Thoracic Assessment Thoracic Assessment: Within Functional Limits Lumbar Assessment Lumbar Assessment: Within Functional Limits Postural Control Postural Control: Deficits on evaluation Righting Reactions: delayed and inadequate but improved from eval Protective Responses: delayed and inadequate but improved from eval  Balance Balance Balance Assessed: Yes Static  Sitting Balance Static Sitting - Level of Assistance: 5: Stand by assistance Dynamic Sitting Balance Dynamic Sitting - Level of Assistance: 5: Stand by assistance Static Standing Balance Static Standing - Balance Support: No upper extremity supported Static Standing - Level of Assistance: 5: Stand by assistance Dynamic Standing Balance Dynamic Standing - Balance Support: During functional activity Dynamic Standing - Level of Assistance: 4: Min assist Extremity Assessment  RLE Assessment RLE Assessment: Within Functional Limits LLE Assessment LLE Assessment: Exceptions to [facility] General Strength Comments: grossly 3/5    Breck Coons, PT, DPT 07/21/2020, 12:22 PM
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
## 2020-07-21 NOTE — Progress Notes (Signed)
Patient ID: Beth [name], female   DOB: 1986/02/09, 34 y.o.   MRN: [id number] Team Conference Report to Patient/Family  Team Conference discussion was reviewed with the patient and caregiver, including goals, any changes in plan of care and target discharge date.  Patient and caregiver express understanding and are in agreement.  The patient has a target discharge date of 07/22/20.  SW followed up with patient fiance. He received call from [place] from patient insurance regarding flight transportation for tomorrow. SW left [place] a [place] at [phone], sw awaiting follow up  Andria Rhein 07/21/2020, 12:06 PM

## 2020-07-21 NOTE — Progress Notes (Signed)
Patient ID: Beth [name], female   DOB: [birth year], 34 y.o.   MRN: [id number] Team Conference Report to Patient/Family  Team Conference discussion was reviewed with the patient and caregiver, including goals, any changes in plan of care and target discharge date.  Patient and caregiver express understanding and are in agreement.  The patient has a target discharge date of 07/22/20.  Andria Rhein 07/21/2020, 1:30 PM
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
## 2020-07-21 NOTE — Progress Notes (Signed)
Occupational Therapy Discharge Summary  Patient Details  Name: Beth [name] MRN: [id number] Date of Birth: 1986/05/22  Today's Date: 07/21/2020 OT Individual Time: 1315-1415 OT Individual Time Calculation (min): 60 min    Patient greeted semi-reclined in bed and agreeable to OT  Treatment session focused on self-care retraining. Pt ambulated with Min HHA to bathroom and voided bladder. Pt able to manage clothing and complete peri-care with CGA for balance when doffing pants. Bathing completed with mostly supervision, but CGA when standing to wash buttocks. Pt hyperverbose thorughout session, but able to be redirected to task at hand. Dressing tasks completed at supervision/CGA level with min verbal cues for hemi technique. Reviewed self-ROM exercises and discussed home modifications for safe BADL participation. Pt returned to room and completed stand0pivot back to bed with supervision.    Patient has met 10 of 10 long term goals due to improved activity tolerance, improved balance, postural control, ability to compensate for deficits, functional use of  LEFT upper and LEFT lower extremity, improved attention, improved awareness and improved coordination.  Patient to discharge at [facility] Assist level.  Patient's care partner is independent to provide the necessary physical and cognitive assistance at discharge.    Reasons goals not met: n/a  Recommendation:  Patient will benefit from ongoing skilled OT services in outpatient setting to continue to advance functional skills in the area of BADL, Reduce care partner burden and functional use of L UE.  Equipment: tub transfer bench, 3-in-1 BSC, wheelchair  Reasons for discharge: treatment goals met and discharge from hospital  Patient/family agrees with progress made and goals achieved: Yes  OT Discharge Precautions/Restrictions  Precautions Precautions: Fall Precaution Comments: Lt radial head fx; PIP fx Rt little finger, and MCP fx  Rt ring finger; unrestricted ROM LUE per Dr Doreatha Martin note 10/29 Restrictions Weight Bearing Restrictions: No RUE Weight Bearing: Weight bearing as tolerated LUE Weight Bearing: Weight bearing as tolerated RLE Weight Bearing: Weight bearing as tolerated LLE Weight Bearing: Weight bearing as tolerated Pain  denies pain ADL ADL Eating: Supervision/safety Grooming: Supervision/safety Upper Body Bathing: Supervision/safety Where Assessed-Upper Body Bathing: Bed level Lower Body Bathing: Contact guard Where Assessed-Lower Body Bathing: Bed level Upper Body Dressing: Contact guard Where Assessed-Upper Body Dressing: Bed level Lower Body Dressing: Minimal assistance Where Assessed-Lower Body Dressing: Bed level Toileting: Minimal assistance Tub/Shower Transfer: Minimal assistance Vision Baseline Vision/History: No visual deficits Perception  Perception: Within Functional Limits Praxis Praxis: Impaired Praxis Impairment Details: Motor planning;Perseveration Cognition Overall Cognitive Status: Impaired/Different from baseline Arousal/Alertness: Awake/alert Orientation Level: Oriented X4 Attention: Sustained Focused Attention: Appears intact Sustained Attention Impairment: Verbal basic;Functional basic Memory: Impaired Memory Impairment: Decreased recall of new information;Decreased short term memory Awareness: Impaired Awareness Impairment: Anticipatory impairment Problem Solving: Impaired Problem Solving Impairment: Functional complex;Verbal complex Safety/Judgment: Impaired Rancho Los Amigos Scales of Cognitive Functioning: Automatic/appropriate Sensation Sensation Light Touch: Appears Intact Coordination Gross Motor Movements are Fluid and Coordinated: No Fine Motor Movements are Fluid and Coordinated: No Heel Shin Test: decreased smoothness and accuracy Motor  Motor Motor: Motor apraxia;Hemiplegia Motor - Discharge Observations: L sided hemiplegia improved fromi  eval Mobility  Bed Mobility Bed Mobility: Sit to Supine;Supine to Sit Rolling Right: Supervision/verbal cueing Rolling Left: Supervision/Verbal cueing Supine to Sit: Supervision/Verbal cueing Sit to Supine: Supervision/Verbal cueing Transfers Sit to Stand: Contact Guard/Touching assist Stand to Sit: Contact Guard/Touching assist  Trunk/Postural Assessment  Cervical Assessment Cervical Assessment: Within Functional Limits Thoracic Assessment Thoracic Assessment: Within Functional Limits Lumbar Assessment Lumbar Assessment: Within Functional Limits  Postural Control Postural Control: Deficits on evaluation Righting Reactions: delayed and inadequate but improved from eval Protective Responses: delayed and inadequate but improved from eval  Balance Balance Balance Assessed: Yes Static Sitting Balance Static Sitting - Balance Support: Feet supported Static Sitting - Level of Assistance: 5: Stand by assistance Dynamic Sitting Balance Dynamic Sitting - Balance Support: During functional activity Dynamic Sitting - Level of Assistance: 5: Stand by assistance Static Standing Balance Static Standing - Balance Support: No upper extremity supported Static Standing - Level of Assistance: 5: Stand by assistance Dynamic Standing Balance Dynamic Standing - Balance Support: During functional activity Dynamic Standing - Level of Assistance: 4: Min assist Extremity/Trunk Assessment RUE Assessment RUE Assessment: Within Functional Limits LUE Assessment LUE Assessment: Exceptions to [facility] Active Range of Motion (AROM) Comments: elbow extension limited to 20* from neutral, can achieve full but weak grasp, has developed flexor synergy and can activate elbow shoulder flexion LUE Body System: Neuro Brunstrum levels for arm and hand: Arm;Hand Brunstrum level for arm: Stage IV Movement is deviating from synergy Brunstrum level for hand: Stage III Synergies performed voluntarily   Valma Cava 07/21/2020, 12:32 PM

## 2020-07-21 NOTE — Progress Notes (Signed)
Patient ID: Beth [name], female   DOB: 07-23-1986, 34 y.o.   MRN: [id number]  Covering for primary sw, Auria    Sw received update that patient transportation was denied and would require family to pay $15,000 upfront. SW followed up with patient fiance. Fiance inquiring about commercial flights and potential of driving. Therapy does not recommended a commercial flight due to behavior. Therapy is ok with the car transfer as long as fiance has support and take rest breaks with patient. Fiance was fine with that. SW provided him with contact information in case there are any changes to travel arragements. Sw will follow up with family in the morning.  [place], [place] [phone]

## 2020-07-21 NOTE — Progress Notes (Signed)
Patient ID: Beth [name], female   DOB: 1986-02-08, 34 y.o.   MRN: [id number]  Covering for primary, sw Auria:  Sw received follow up from [place] (covering for primary adjuster) ([employer]) she has informed sw that they are unable to speak with family due to the family having a lawyer involved in case. [name] was able to provide the flight company they plan to use, she is just waiting flight confirmation from [employer]. SW will follow once once transportation information is sent.   [place], [place] [phone]

## 2020-07-21 NOTE — Plan of Care (Signed)
Problem: RH Balance Goal: LTG: Patient will maintain dynamic sitting balance (OT) Description: LTG:  Patient will maintain dynamic sitting balance with assistance during activities of daily living (OT) Outcome: Completed/Met Goal: LTG Patient will maintain dynamic standing with ADLs (OT) Description: LTG:  Patient will maintain dynamic standing balance with assist during activities of daily living (OT)  Outcome: Completed/Met   Problem: Sit to Stand Goal: LTG:  Patient will perform sit to stand in prep for activites of daily living with assistance level (OT) Description: LTG:  Patient will perform sit to stand in prep for activites of daily living with assistance level (OT) Outcome: Completed/Met   Problem: RH Grooming Goal: LTG Patient will perform grooming w/assist,cues/equip (OT) Description: LTG: Patient will perform grooming with assist, with/without cues using equipment (OT) Outcome: Completed/Met   Problem: RH Bathing Goal: LTG Patient will bathe all body parts with assist levels (OT) Description: LTG: Patient will bathe all body parts with assist levels (OT) Outcome: Completed/Met   Problem: RH Dressing Goal: LTG Patient will perform upper body dressing (OT) Description: LTG Patient will perform upper body dressing with assist, with/without cues (OT). Outcome: Completed/Met Goal: LTG Patient will perform lower body dressing w/assist (OT) Description: LTG: Patient will perform lower body dressing with assist, with/without cues in positioning using equipment (OT) Outcome: Completed/Met   Problem: RH Toileting Goal: LTG Patient will perform toileting task (3/3 steps) with assistance level (OT) Description: LTG: Patient will perform toileting task (3/3 steps) with assistance level (OT)  Outcome: Completed/Met   Problem: RH Functional Use of Upper Extremity Goal: LTG Patient will use RT/LT upper extremity as a (OT) Description: LTG: Patient will use right/left upper  extremity as a stabilizer/gross assist/diminished/nondominant/dominant level with assist, with/without cues during functional activity (OT) Outcome: Completed/Met   Problem: RH Toilet Transfers Goal: LTG Patient will perform toilet transfers w/assist (OT) Description: LTG: Patient will perform toilet transfers with assist, with/without cues using equipment (OT) Outcome: Completed/Met

## 2020-07-21 NOTE — Patient Care Conference (Signed)
Inpatient RehabilitationTeam Conference and Plan of Care Update Date: 07/21/2020   Time: 10:49 AM    Patient Name: Beth [name]      Medical Record Number: [id number]  Date of Birth: 1986-02-01 Sex: Female         Room/Bed: 4W14C/4W14C-01 Payor Info: Payor: MEDICAID ADVANTAGE OUT OF STATE / Plan: BCBS MEDICAID ADVANTAGE / Product Type: *No Product type* /    Admit Date/Time:  06/11/2020  8:36 PM  Primary Diagnosis:  TBI (traumatic brain injury) [facility])  Hospital Problems: Principal Problem:   TBI (traumatic brain injury) (HCC) Active Problems:   Sinus tachycardia   Psychogenic fecal incontinence   S/P percutaneous endoscopic gastrostomy (PEG) tube placement (HCC)   Sleep disturbance   Acute blood loss anemia   Hyperglycemia   Oral ulcer   PEG tube malfunction (HCC)   Oral herpes   Labile blood glucose    Expected Discharge Date: Expected Discharge Date: 07/22/20  Team Members Present: Physician leading conference: Dr. Sula Soda Care Coodinator Present: Lavera Guise, BSW;Timmy Bubeck Marlyne Beards, RN, BSN, CRRN Nurse Present: Otilio Carpen, RN PT Present: Malachi Pro, PT OT Present: Kearney Hard, OT SLP Present: Feliberto Gottron, SLP PPS Coordinator present : Edson Snowball, [place]ed, SLP     Current Status/Progress Goal Weekly Team Focus  Bowel/Bladder   Pt. continent of B/B w/ occasional urinary incontinence; LBM 07/18/20.  Continence  Assess B/B every shift and PRN.   Swallow/Nutrition/ Hydration   Regular textures with thin liquids, Full supervision  Supevision  Family Education   ADL's   Supervision/min A overall  min/CGA overall  L NMR, pt/family education, dc planning, cognitive retraining   Mobility   supervision bed mobility, CGA sit to stand, CGA/minA bed to chair, gait ~300' with modA and no AD. Can walk short distance, straight line ambulation with CGA but consistently loses balance with turns.  MinA  LLE NMR, ambulation, DC prep    Communication   Supervision  Min A  Family Education   Safety/Cognition/ Behavioral Observations  [place] Level VII-Min A  Min-Mod A  Family Education   Pain   Denies any pain.  Pain scale 0/10.  Asses pain Q shift and PRN.   Skin   Intact.  No new skin breakdown or infection.  Assess skin Q shift and PRN.     Discharge Planning:  Pt insurance will not approve any SNF locations in [place] . Family plan for pt to d/c to [place] with pt fiance. He intends to have private aides to help provide 24/7 care for pt. Referral for [facility] outpatient referral sent; [facility] to begin pt with their inhome aide program and then transition to outpatient services. Pt to receive HHPT/OT/SLP/aide/SW/SN.  Family edu completed. Waiting on follow-up from family to inform if patient approved for air medical travel through [employer].   Team Discussion: Continent B/B, apply ice to left wrist. MD continues to wean sedating medications and patient improves. Patient has reached goals and is ready for discharge. She is on a regular diet with thin liquids and min assist just to remind her not to talk while eating and not to take large bites. Patient on target to meet rehab goals: yes, she is ready for discharge.   *See Care Plan and progress notes for long and short-term goals.   Revisions to Treatment Plan:  Patient ready for discharge  Teaching Needs: Medication management Peg site care Peg tube flush  Current Barriers to Discharge: Decreased caregiver  support, Home enviroment access/layout, Wound care and Behavior  Possible Resolutions to Barriers: Continue current medications, wound care teaching, peg tube maintenance, provide emotional support to family and patient.      Medical Summary Current Status: Mood has improved, HR has improved, hypotensive yesterday, left wrist with tightness, impusivitiy much improved  Barriers to Discharge: Medical stability;Behavior  Barriers to  Discharge Comments: hypotensive yesterday, left wrist with tightness, still perseverative and tangential but impulsivity has improved Possible Resolutions to [employer] Focus: Continue to monitor BP TID, ice left wrist TID, wean seroquel, wean ritalin   Continued Need for Acute Rehabilitation Level of Care: The patient requires daily medical management by a physician with specialized training in physical medicine and rehabilitation for the following reasons: Direction of a multidisciplinary physical rehabilitation program to maximize functional independence : Yes Medical management of patient stability for increased activity during participation in an intensive rehabilitation regime.: Yes Analysis of laboratory values and/or radiology reports with any subsequent need for medication adjustment and/or medical intervention. : Yes   I attest that I was present, lead the team conference, and concur with the assessment and plan of the team.   Tennis Must 07/21/2020, 3:58 PM

## 2020-07-22 DIAGNOSIS — S069X0S Unspecified intracranial injury without loss of consciousness, sequela: Secondary | ICD-10-CM

## 2020-07-22 MED ORDER — METHYLPHENIDATE HCL 5 MG PO TABS: 5.0000 mg | ORAL_TABLET | Freq: Every day | ORAL | Status: DC

## 2020-07-22 NOTE — Progress Notes (Addendum)
Patient ID: Beth [name], female   DOB: 1986-02-17, 34 y.o.   MRN: [id number]  Covering for primary SW, Auria  Sw followed up with radiology patient CD request was delivered to the floor on yesterday to tube station #40.  Followed up with fiance, he has not received yet. Family ready for d.c today.  [place], [place] [phone]

## 2020-07-22 NOTE — Progress Notes (Signed)
Inpatient Rehabilitation Care Coordinator Discharge Note  The overall goal for the admission was met for:   Discharge location: Yes, home  Length of Stay: Yes, 41 Days  Discharge activity level: Yes, ambulatory level Min Assist  Home/community participation: Yes  Services provided included: MD, RD, PT, OT, SLP, RN, CM, TR, Pharmacy, Neuropsych and SW  Financial Services: Other: Medicaid Advantage Out of State  Choices offered to/list presented IQ:[id number] (Mother)/Dalshawn (Fiance)  Follow-up services arranged: Outpatient: [facility]  Comments (or additional information): OP acute program (3 hrs per day) HHPT/OT/SLP/Aide/SW Wheelchair, BSC, TTB,   Patient/Family verbalized understanding of follow-up arrangements: Yes  Individual responsible for coordination of the follow-up plan: Veronica/Dalshawn [phone]  Confirmed correct DME delivered: Dyanne Iha 07/22/2020    Dyanne Iha

## 2020-07-22 NOTE — Progress Notes (Signed)
Patient ID: Beth [name], female   DOB: 1986/05/26, 34 y.o.   MRN: [id number]   D/C summary faxed to [facility] and [facility] Bed OP at [phone] and [phone]
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
## 2020-07-29 ENCOUNTER — Telehealth: Payer: Self-pay

## 2020-07-29 NOTE — Telephone Encounter (Signed)
SW returned phone call/left message for Beth [name] who reported family has not returned her phone calls and they will close case; also inquired if pt d/c to home. SW informed pt has d/c to home last week and encouraged follow-up if needed.
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
## 2022-06-17 IMAGING — DX DG CHEST 1V PORT
1 series · 1 of 1 positions shown · non-contrast
Comparison: Yesterday

CLINICAL DATA: Respiratory failure

EXAM:
PORTABLE CHEST 1 VIEW

[chest ap]
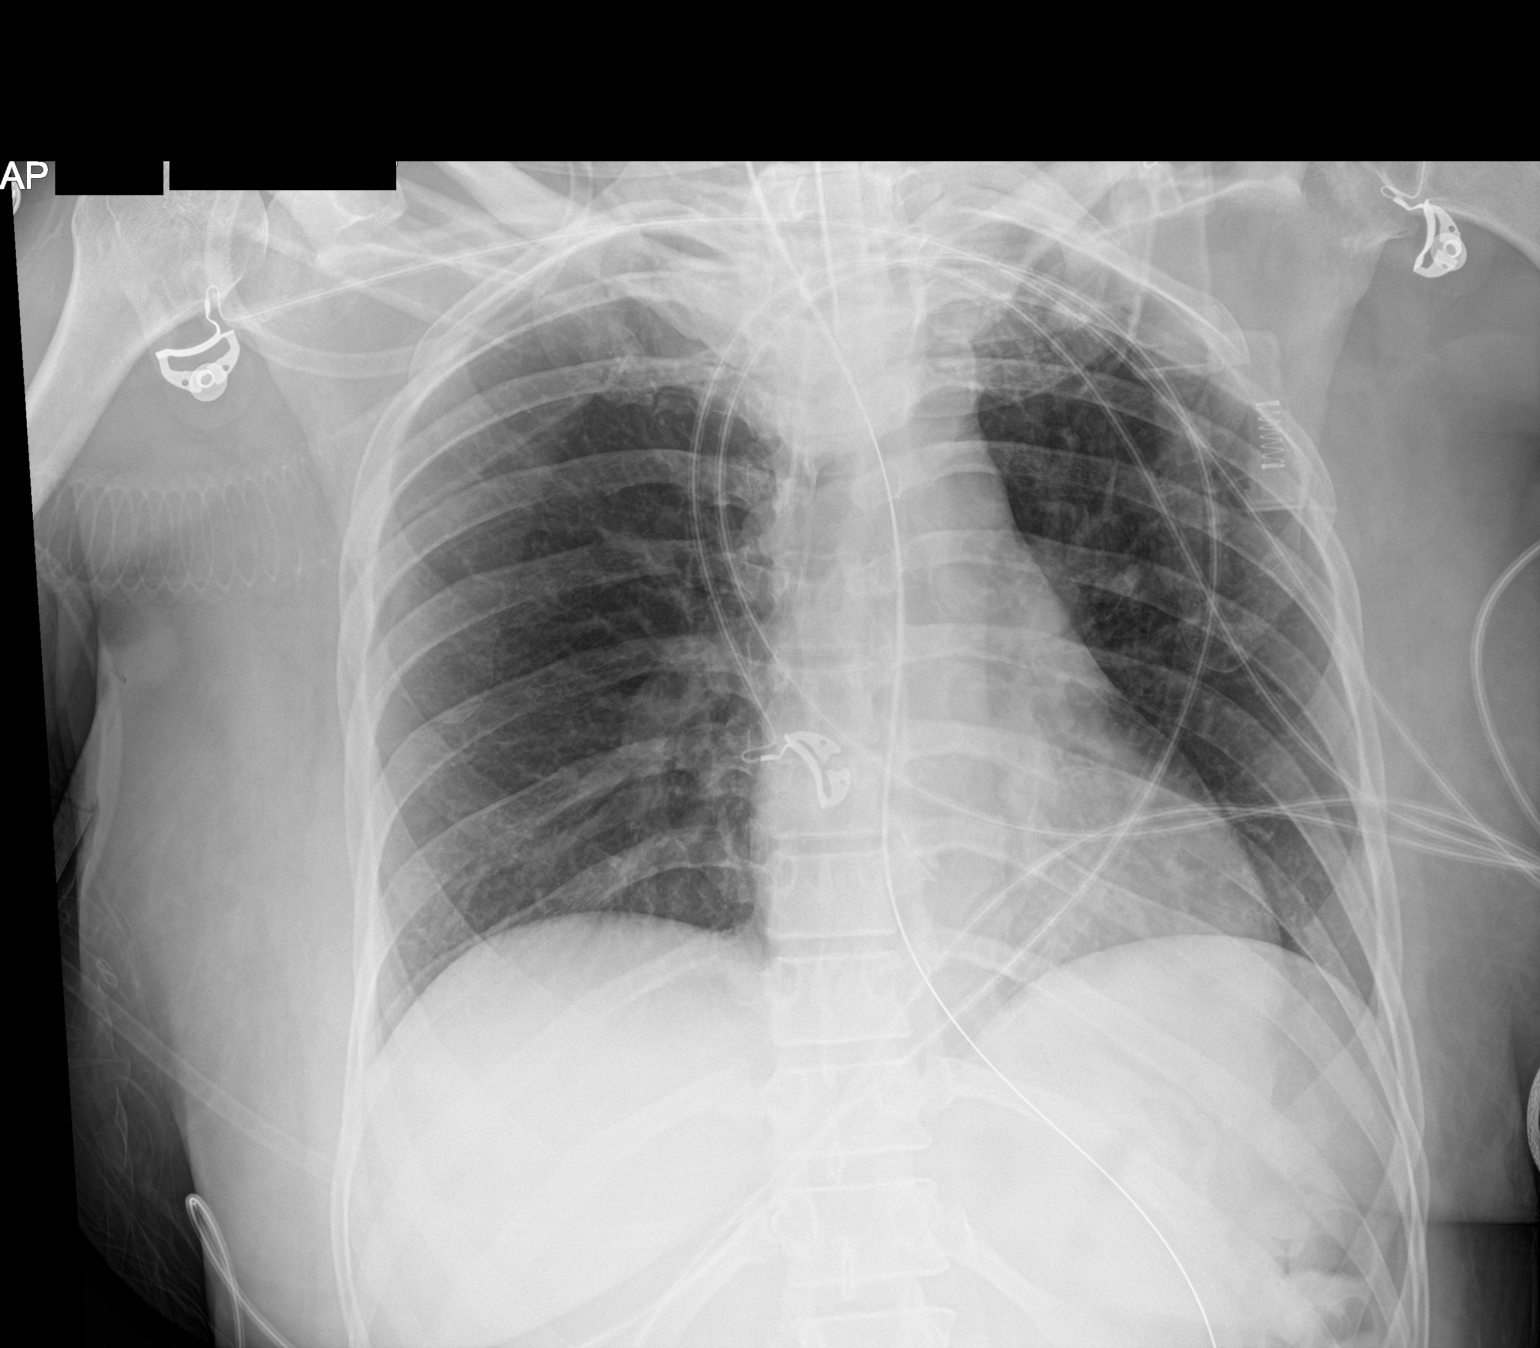

[1 of 1 positions shown; findings below may reference images not displayed]

FINDINGS: Endotracheal tube tip is at the clavicular heads. The enteric tube
reaches the stomach.

Artifact from EKG leads. Stable symmetric aeration. No visible
pneumothorax. Normal heart size.
IMPRESSION: Unremarkable hardware positioning and stable symmetric pulmonary
inflation.
